# Patient Record
Sex: Male | Born: 1940 | Race: White | Hispanic: No | Marital: Married | Smoking: Former smoker
Health system: Southern US, Community
[De-identification: ages and names within clinical notes are randomized; demographics above are authoritative.]

## PROBLEM LIST (undated history)

## (undated) ENCOUNTER — Emergency Department (HOSPITAL_COMMUNITY): Admission: EM | Payer: Medicare HMO

## (undated) DIAGNOSIS — M19019 Primary osteoarthritis, unspecified shoulder: Secondary | ICD-10-CM

## (undated) DIAGNOSIS — Z951 Presence of aortocoronary bypass graft: Secondary | ICD-10-CM

## (undated) DIAGNOSIS — Z953 Presence of xenogenic heart valve: Secondary | ICD-10-CM

## (undated) DIAGNOSIS — E119 Type 2 diabetes mellitus without complications: Secondary | ICD-10-CM

## (undated) DIAGNOSIS — Z9861 Coronary angioplasty status: Secondary | ICD-10-CM

## (undated) DIAGNOSIS — I2510 Atherosclerotic heart disease of native coronary artery without angina pectoris: Secondary | ICD-10-CM

## (undated) DIAGNOSIS — I10 Essential (primary) hypertension: Secondary | ICD-10-CM

## (undated) DIAGNOSIS — C679 Malignant neoplasm of bladder, unspecified: Secondary | ICD-10-CM

## (undated) DIAGNOSIS — D472 Monoclonal gammopathy: Secondary | ICD-10-CM

## (undated) DIAGNOSIS — E782 Mixed hyperlipidemia: Secondary | ICD-10-CM

## (undated) DIAGNOSIS — R911 Solitary pulmonary nodule: Secondary | ICD-10-CM

## (undated) DIAGNOSIS — D509 Iron deficiency anemia, unspecified: Secondary | ICD-10-CM

## (undated) DIAGNOSIS — I350 Nonrheumatic aortic (valve) stenosis: Secondary | ICD-10-CM

## (undated) DIAGNOSIS — N189 Chronic kidney disease, unspecified: Secondary | ICD-10-CM

## (undated) DIAGNOSIS — K219 Gastro-esophageal reflux disease without esophagitis: Secondary | ICD-10-CM

## (undated) HISTORY — PX: TONSILLECTOMY: SUR1361

## (undated) HISTORY — DX: Malignant neoplasm of bladder, unspecified: C67.9

## (undated) HISTORY — DX: Atherosclerotic heart disease of native coronary artery without angina pectoris: I25.10

## (undated) HISTORY — PX: APPENDECTOMY: SHX54

## (undated) HISTORY — PX: BACK SURGERY: SHX140

## (undated) HISTORY — DX: Coronary angioplasty status: Z98.61

---

## 2001-01-02 ENCOUNTER — Encounter: Payer: Self-pay | Admitting: *Deleted

## 2001-01-02 ENCOUNTER — Inpatient Hospital Stay (HOSPITAL_COMMUNITY): Admission: EM | Admit: 2001-01-02 | Discharge: 2001-01-03 | Payer: Self-pay | Admitting: Emergency Medicine

## 2017-08-21 ENCOUNTER — Encounter (HOSPITAL_BASED_OUTPATIENT_CLINIC_OR_DEPARTMENT_OTHER): Payer: Self-pay | Admitting: *Deleted

## 2017-08-21 NOTE — Patient Instructions (Signed)
Erik Keith  08/21/2017   Your procedure is scheduled on: 08/27/2017   Report to [facility] Main  Entrance  Report to admitting at    1030 AM    Call this number if you have problems the morning of surgery [phone]   Remember: Do not eat food  :After Midnight. Erik Keith have clear liquids from 12 midnite until 0700am morning of surgery then nothing by mouth.      Take these medicines the morning of surgery with A SIP OF WATER: Hydrocodone if needed, Protonix  DO NOT TAKE ANY DIABETIC MEDICATIONS DAY OF YOUR SURGERY                               You Erik Keith not have any metal on your body including hair pins and              piercings  Do not wear jewelry, , lotions, powders or perfumes, deodorant                           Men Erik Keith shave face and neck.   Do not bring valuables to the hospital. Erik Keith.  Contacts, dentures or bridgework Erik Keith not be worn into surgery.  Leave suitcase in the car. After surgery it Erik Keith be brought to your room.                       Please read over the following fact sheets you were given: _____________________________________________________________________                CLEAR LIQUID DIET   Foods Allowed                                                                     Foods Excluded  Coffee and tea, regular and decaf                             liquids that you cannot  Plain Jell-O in any flavor                                             see through such as: Fruit ices (not with fruit pulp)                                     milk, soups, orange juice  Iced Popsicles                                    All solid food Carbonated beverages, regular and diet  Cranberry, grape and apple juices Sports drinks like Gatorade Lightly seasoned clear broth or consume(fat free) Sugar, honey syrup  Sample Menu Breakfast                                 Lunch                                     Supper Cranberry juice                    Beef broth                            Chicken broth Jell-O                                     Grape juice                           Apple juice Coffee or tea                        Jell-O                                      Popsicle                                                Coffee or tea                        Coffee or tea  _____________________________________________________________________  Erik Keith [facility] - Preparing for Surgery Before surgery, you can play an important role.  Because skin is not sterile, your skin needs to be as free of germs as possible.  You can reduce the number of germs on your skin by washing with CHG (chlorahexidine gluconate) soap before surgery.  CHG is an antiseptic cleaner which kills germs and bonds with the skin to continue killing germs even after washing. Please DO NOT use if you have an allergy to CHG or antibacterial soaps.  If your skin becomes reddened/irritated stop using the CHG and inform your nurse when you arrive at Short Stay. Do not shave (including legs and underarms) for at least 48 hours prior to the first CHG shower.  You Erik Keith shave your face/neck. Please follow these instructions carefully:  1.  Shower with CHG Soap the night before surgery and the  morning of Surgery.  2.  If you choose to wash your hair, wash your hair first as usual with your  normal  shampoo.  3.  After you shampoo, rinse your hair and body thoroughly to remove the  shampoo.                           4.  Use CHG as you would any other liquid soap.  You can apply chg directly  to the skin and wash  Gently with a scrungie or clean washcloth.  5.  Apply the CHG Soap to your body ONLY FROM THE NECK DOWN.   Do not use on face/ open                           Wound or open sores. Avoid contact with eyes, ears mouth and genitals (private parts).                        Wash face,  Genitals (private parts) with your normal soap.             6.  Wash thoroughly, paying special attention to the area where your surgery  will be performed.  7.  Thoroughly rinse your body with warm water from the neck down.  8.  DO NOT shower/wash with your normal soap after using and rinsing off  the CHG Soap.                9.  Pat yourself dry with a clean towel.            10.  Wear clean pajamas.            11.  Place clean sheets on your bed the night of your first shower and do not  sleep with pets. Day of Surgery : Do not apply any lotions/deodorants the morning of surgery.  Please wear clean clothes to the hospital/surgery center.  FAILURE TO FOLLOW THESE INSTRUCTIONS Erik Keith RESULT IN THE CANCELLATION OF YOUR SURGERY PATIENT SIGNATURE_________________________________  NURSE SIGNATURE__________________________________  ________________________________________________________________________   Erik Keith  An incentive spirometer is a tool that can help keep your lungs clear and active. This tool measures how well you are filling your lungs with each breath. Taking long deep breaths [place] help reverse or decrease the chance of developing breathing (pulmonary) problems (especially infection) following:  A long period of time when you are unable to move or be active. BEFORE THE PROCEDURE   If the spirometer includes an indicator to show your best effort, your nurse or respiratory therapist will set it to a desired goal.  If possible, sit up straight or lean slightly forward. Try not to slouch.  Hold the incentive spirometer in an upright position. INSTRUCTIONS FOR USE  1. Sit on the edge of your bed if possible, or sit up as far as you can in bed or on a chair. 2. Hold the incentive spirometer in an upright position. 3. Breathe out normally. 4. Place the mouthpiece in your mouth and seal your lips tightly around it. 5. Breathe in slowly and as deeply as  possible, raising the piston or the ball toward the top of the column. 6. Hold your breath for 3-5 seconds or for as long as possible. Allow the piston or ball to fall to the bottom of the column. 7. Remove the mouthpiece from your mouth and breathe out normally. 8. Rest for a few seconds and repeat Steps 1 through 7 at least 10 times every 1-2 hours when you are awake. Take your time and take a few normal breaths between deep breaths. 9. The spirometer [place] include an indicator to show your best effort. Use the indicator as a goal to work toward during each repetition. 10. After each set of 10 deep breaths, practice coughing to be sure your lungs are clear. If you have an incision (the cut made at the time of  surgery), support your incision when coughing by placing a pillow or rolled up towels firmly against it. Once you are able to get out of bed, walk around indoors and cough well. You [place] stop using the incentive spirometer when instructed by your caregiver.  RISKS AND COMPLICATIONS  Take your time so you do not get dizzy or light-headed.  If you are in pain, you [place] need to take or ask for pain medication before doing incentive spirometry. It is harder to take a deep breath if you are having pain. AFTER USE  Rest and breathe slowly and easily.  It can be helpful to keep track of a log of your progress. Your caregiver can provide you with a simple table to help with this. If you are using the spirometer at home, follow these instructions: [place] IF:   You are having difficultly using the spirometer.  You have trouble using the spirometer as often as instructed.  Your pain medication is not giving enough relief while using the spirometer.  You develop fever of 100.5 F (38.1 C) or higher. SEEK IMMEDIATE MEDICAL CARE IF:   You cough up bloody sputum that had not been present before.  You develop fever of 102 F (38.9 C) or greater.  You develop worsening pain at or near  the incision site. MAKE SURE YOU:   Understand these instructions.  Will watch your condition.  Will get help right away if you are not doing well or get worse. Document Released: 04/30/2006 Document Revised: 03/12/2011 Document Reviewed: 07/01/2006 ExitCare Patient Information 2014 ExitCare, [place].   ________________________________________________________________________ How to Manage Your Diabetes Before and After Surgery  Why is it important to control my blood sugar before and after surgery? . Improving blood sugar levels before and after surgery helps healing and can limit problems. . A way of improving blood sugar control is eating a healthy diet by: o  Eating less sugar and carbohydrates o  Increasing activity/exercise o  Talking with your doctor about reaching your blood sugar goals . High blood sugars (greater than 180 mg/dL) can raise your risk of infections and slow your recovery, so you will need to focus on controlling your diabetes during the weeks before surgery. . Make sure that the doctor who takes care of your diabetes knows about your planned surgery including the date and location.  How do I manage my blood sugar before surgery? . Check your blood sugar at least 4 times a day, starting 2 days before surgery, to make sure that the level is not too high or low. o Check your blood sugar the morning of your surgery when you wake up and every 2 hours until you get to the Short Stay unit. . If your blood sugar is less than 70 mg/dL, you will need to treat for low blood sugar: o Do not take insulin. o Treat a low blood sugar (less than 70 mg/dL) with  cup of clear juice (cranberry or apple), 4 glucose tablets, OR glucose gel. o Recheck blood sugar in 15 minutes after treatment (to make sure it is greater than 70 mg/dL). If your blood sugar is not greater than 70 mg/dL on recheck, call [phone] for further instructions. . Report your blood sugar to the short stay  nurse when you get to Short Stay.  . If you are admitted to the hospital after surgery: o Your blood sugar will be checked by the staff and you will probably be given insulin after surgery (  instead of oral diabetes medicines) to make sure you have good blood sugar levels. o The goal for blood sugar control after surgery is 80-180 mg/dL.   WHAT DO I DO ABOUT MY DIABETES MEDICATION?  Marland Kitchen Do not take oral diabetes medicines (pills) the morning of surgery.  .       .  .  .  .          Patient Signature:  Date:   Nurse Signature:  Date:   Reviewed and Endorsed by [facility] Patient Education Committee, August 2015

## 2017-08-21 NOTE — Progress Notes (Signed)
Received pt cardiologist clearance w/ lov note, echo, and cardiac cath via fax from dr Theda Sers office , pt scheduled for left shoulder arthroscopy 08-27-2017.  Reviewed all information and updated history in epic.  Pt not a candidate for ambulatory surgery due to pt has severe aortic valve stenosis w/ a valve area of 0.61cm^2, which is below the guidelines of 1.0cm^2.  Called and spoke w/ laurie, dr Theda Sers scheduler, informed her that pt would need to done at main OR and why.

## 2017-08-22 ENCOUNTER — Encounter (HOSPITAL_COMMUNITY): Payer: Self-pay

## 2017-08-22 ENCOUNTER — Encounter (HOSPITAL_COMMUNITY)
Admission: RE | Admit: 2017-08-22 | Discharge: 2017-08-22 | Disposition: A | Discharge status: Home / Self Care | Payer: Medicare HMO | Source: Ambulatory Visit | Attending: Specialist | Admitting: Specialist

## 2017-08-22 ENCOUNTER — Other Ambulatory Visit: Payer: Self-pay

## 2017-08-22 DIAGNOSIS — M19012 Primary osteoarthritis, left shoulder: Secondary | ICD-10-CM | POA: Insufficient documentation

## 2017-08-22 DIAGNOSIS — Z01812 Encounter for preprocedural laboratory examination: Secondary | ICD-10-CM | POA: Insufficient documentation

## 2017-08-22 HISTORY — DX: Gastro-esophageal reflux disease without esophagitis: K21.9

## 2017-08-22 LAB — CBC WITH DIFFERENTIAL/PLATELET
Basophils Absolute: 0 10*3/uL (ref 0.0–0.1)
Basophils Relative: 0 %
Eosinophils Absolute: 0.1 10*3/uL (ref 0.0–0.7)
Eosinophils Relative: 1 %
HCT: 46.3 % (ref 39.0–52.0)
Hemoglobin: 15.5 g/dL (ref 13.0–17.0)
Lymphocytes Relative: 38 %
Lymphs Abs: 3.6 10*3/uL (ref 0.7–4.0)
MCH: 31.8 pg (ref 26.0–34.0)
MCHC: 33.5 g/dL (ref 30.0–36.0)
MCV: 94.9 fL (ref 78.0–100.0)
Monocytes Absolute: 0.7 10*3/uL (ref 0.1–1.0)
Monocytes Relative: 8 %
Neutro Abs: 4.9 10*3/uL (ref 1.7–7.7)
Neutrophils Relative %: 53 %
Platelets: 168 10*3/uL (ref 150–400)
RBC: 4.88 MIL/uL (ref 4.22–5.81)
RDW: 14.2 % (ref 11.5–15.5)
WBC: 9.3 10*3/uL (ref 4.0–10.5)

## 2017-08-22 LAB — COMPREHENSIVE METABOLIC PANEL
ALT: 31 U/L (ref 0–44)
AST: 29 U/L (ref 15–41)
Albumin: 4.4 g/dL (ref 3.5–5.0)
Alkaline Phosphatase: 53 U/L (ref 38–126)
Anion gap: 8 (ref 5–15)
BUN: 23 mg/dL (ref 8–23)
CO2: 27 mmol/L (ref 22–32)
Calcium: 10.1 mg/dL (ref 8.9–10.3)
Chloride: 106 mmol/L (ref 98–111)
Creatinine, Ser: 1.3 mg/dL — ABNORMAL HIGH (ref 0.61–1.24)
GFR calc Af Amer: 59 mL/min — ABNORMAL LOW (ref >60)
GFR calc non Af Amer: 51 mL/min — ABNORMAL LOW (ref >60)
Glucose, Bld: 145 mg/dL — ABNORMAL HIGH (ref 70–99)
Potassium: 4.9 mmol/L (ref 3.5–5.1)
Sodium: 141 mmol/L (ref 135–145)
Total Bilirubin: 0.9 mg/dL (ref 0.3–1.2)
Total Protein: 7.9 g/dL (ref 6.5–8.1)

## 2017-08-22 LAB — HEMOGLOBIN A1C
Hgb A1c MFr Bld: 7.9 % — ABNORMAL HIGH (ref 4.8–5.6)
MEAN PLASMA GLUCOSE: 180.03 mg/dL

## 2017-08-22 LAB — APTT: aPTT: 31 seconds (ref 24–36)

## 2017-08-22 LAB — PROTIME-INR
INR: 0.88
Prothrombin Time: 11.9 seconds (ref 11.4–15.2)

## 2017-08-22 LAB — GLUCOSE, CAPILLARY: GLUCOSE-CAPILLARY: 154 mg/dL — AB (ref 70–99)

## 2017-08-22 NOTE — Progress Notes (Signed)
CMp done 08/22/2017 faxed via epic to dr Theda Sers.

## 2017-08-22 NOTE — Progress Notes (Signed)
Anesthesia ( Dr Milton Ferguson ) made aware that patient has severe Aortic Stenosis.  Anesthesia read report.  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  Cardiac clearance on chart by Dr Dr Beatrix Fetters dated 07/23/2017.  01/30/2017 ekgs on chart.  05/16/2917 LOV - Dr Beatrix Fetters on chart  Echo- 06/11/2017 on chart.   04/27/2015- Heart cath report on chart .   Patient plays golf ( 18 holes) 3 times per week and mows 3 yards per week.   No new orders given by anesthesia.

## 2017-08-23 NOTE — Progress Notes (Signed)
.......  [birth year]  patient's date of birth is  Your patient has screened at an elevated risk for obstructive sleep apnea using the STOP-Bang tool during a presurgical visit. A score of 5 or greater is an elevated risk. Patient Score = 5.

## 2017-08-27 ENCOUNTER — Ambulatory Visit (HOSPITAL_COMMUNITY): Payer: Medicare HMO | Admitting: Certified Registered Nurse Anesthetist

## 2017-08-27 ENCOUNTER — Encounter (HOSPITAL_COMMUNITY): Payer: Self-pay

## 2017-08-27 ENCOUNTER — Ambulatory Visit (HOSPITAL_COMMUNITY)
Admission: RE | Admit: 2017-08-27 | Discharge: 2017-08-27 | Disposition: A | Discharge status: Home / Self Care | Payer: Medicare HMO | Source: Ambulatory Visit | Attending: Specialist | Admitting: Specialist

## 2017-08-27 ENCOUNTER — Telehealth: Payer: Self-pay | Admitting: Cardiovascular Disease

## 2017-08-27 ENCOUNTER — Encounter (HOSPITAL_COMMUNITY)
Admission: RE | Disposition: A | Discharge status: Home / Self Care | Payer: Self-pay | Source: Ambulatory Visit | Attending: Specialist

## 2017-08-27 DIAGNOSIS — M19012 Primary osteoarthritis, left shoulder: Secondary | ICD-10-CM | POA: Insufficient documentation

## 2017-08-27 DIAGNOSIS — Z5309 Procedure and treatment not carried out because of other contraindication: Secondary | ICD-10-CM | POA: Insufficient documentation

## 2017-08-27 DIAGNOSIS — I350 Nonrheumatic aortic (valve) stenosis: Secondary | ICD-10-CM | POA: Insufficient documentation

## 2017-08-27 HISTORY — DX: Iron deficiency anemia, unspecified: D50.9

## 2017-08-27 HISTORY — DX: Primary osteoarthritis, unspecified shoulder: M19.019

## 2017-08-27 HISTORY — DX: Mixed hyperlipidemia: E78.2

## 2017-08-27 HISTORY — DX: Essential (primary) hypertension: I10

## 2017-08-27 HISTORY — DX: Type 2 diabetes mellitus without complications: E11.9

## 2017-08-27 HISTORY — DX: Nonrheumatic aortic (valve) stenosis: I35.0

## 2017-08-27 LAB — GLUCOSE, CAPILLARY: Glucose-Capillary: 199 mg/dL — ABNORMAL HIGH (ref 70–99)

## 2017-08-27 SURGERY — SHOULDER ARTHROSCOPY WITH ROTATOR CUFF REPAIR AND SUBACROMIAL DECOMPRESSION
Anesthesia: General | Laterality: Left

## 2017-08-27 MED ORDER — ROCURONIUM BROMIDE 100 MG/10ML IV SOLN
INTRAVENOUS | Status: AC
  Filled 2017-08-27: qty 1

## 2017-08-27 MED ORDER — LIDOCAINE 2% (20 MG/ML) 5 ML SYRINGE
INTRAMUSCULAR | Status: AC
  Filled 2017-08-27: qty 5

## 2017-08-27 MED ORDER — SODIUM CHLORIDE 0.9 % IV SOLN
INTRAVENOUS | Status: DC
  Administered 2017-08-27: 11:00:00 via INTRAVENOUS

## 2017-08-27 MED ORDER — CEFAZOLIN SODIUM-DEXTROSE 2-4 GM/100ML-% IV SOLN
2.0000 g | INTRAVENOUS | Status: DC
  Filled 2017-08-27: qty 100

## 2017-08-27 MED ORDER — MIDAZOLAM HCL 2 MG/2ML IJ SOLN
0.5000 mg | Freq: Once | INTRAMUSCULAR | Status: AC
  Administered 2017-08-27 (×2): 1 mg via INTRAVENOUS
  Filled 2017-08-27: qty 2

## 2017-08-27 MED ORDER — FENTANYL CITRATE (PF) 100 MCG/2ML IJ SOLN
100.0000 ug | Freq: Once | INTRAMUSCULAR | Status: AC
  Administered 2017-08-27: 50 ug via INTRAVENOUS
  Filled 2017-08-27: qty 2

## 2017-08-27 MED ORDER — CHLORHEXIDINE GLUCONATE 4 % EX LIQD
60.0000 mL | Freq: Once | CUTANEOUS | Status: AC
  Administered 2017-08-27: 4 via TOPICAL

## 2017-08-27 MED ORDER — BUPIVACAINE-EPINEPHRINE (PF) 0.25% -1:200000 IJ SOLN
INTRAMUSCULAR | Status: AC
  Filled 2017-08-27: qty 30

## 2017-08-27 MED ORDER — PROPOFOL 10 MG/ML IV BOLUS
INTRAVENOUS | Status: AC
  Filled 2017-08-27: qty 20

## 2017-08-27 MED ORDER — BUPIVACAINE-EPINEPHRINE (PF) 0.5% -1:200000 IJ SOLN
INTRAMUSCULAR | Status: AC
  Filled 2017-08-27: qty 30

## 2017-08-27 NOTE — Telephone Encounter (Signed)
I spoke with Dr Kalman Shan and the pt was scheduled for elective shoulder surgery today.  When Dr Kalman Shan reviewed the pt's chart he noted aortic valve measurements and made pt aware.  The pt was unaware of these findings and surgery was cancelled.  I advised Dr Kalman Shan that I would reach out to the pt to educate him about AS and AVR/TAVR evaluation. The pt is currently followed by Dr Mathis Bud for cardiology per notes in [place].   I left a message on the pt's answering machine to contact me to discuss Aortic Stenosis.

## 2017-08-27 NOTE — Progress Notes (Signed)
Surgery cancelled until patient is evaluated by cardiologist for valvular problems. Wife and patient made aware by Dr Theda Sers and Dr Kalman Shan.

## 2017-08-27 NOTE — Anesthesia Preprocedure Evaluation (Deleted)
Anesthesia Evaluation  Patient identified by MRN, date of birth, ID band Patient awake    Reviewed: Allergy & Precautions, NPO status , Patient's Chart, lab work & pertinent test results  Airway Mallampati: II  TM Distance: >3 FB Neck ROM: Full    Dental no notable dental hx.    Pulmonary neg pulmonary ROS, former smoker,    Pulmonary exam normal breath sounds clear to auscultation       Cardiovascular hypertension, + CAD  + Valvular Problems/Murmurs AS  Rhythm:Regular Rate:Normal + Systolic murmurs    Neuro/Psych negative neurological ROS  negative psych ROS   GI/Hepatic negative GI ROS, Neg liver ROS,   Endo/Other  diabetes  Renal/GU negative Renal ROS  negative genitourinary   Musculoskeletal negative musculoskeletal ROS (+)   Abdominal   Peds negative pediatric ROS (+)  Hematology negative hematology ROS (+)   Anesthesia Other Findings   Reproductive/Obstetrics negative OB ROS                            Anesthesia Physical Anesthesia Plan  ASA: IV  Anesthesia Plan: General   Post-op Pain Management:    Induction: Intravenous  PONV Risk Score and Plan: 2 and Ondansetron, Dexamethasone and Treatment [place] vary due to age or medical condition  Airway Management Planned: Oral ETT  Additional Equipment: Arterial line  Intra-op Plan:   Post-operative Plan: Extubation in OR  Informed Consent: I have reviewed the patients History and Physical, chart, labs and discussed the procedure including the risks, benefits and alternatives for the proposed anesthesia with the patient or authorized representative who has indicated his/her understanding and acceptance.   Dental advisory given  Plan Discussed with: CRNA and Surgeon  Anesthesia Plan Comments:        Anesthesia Quick Evaluation

## 2017-08-27 NOTE — Telephone Encounter (Signed)
I spoke with the pt and made him aware of Aortic Stenosis on Echocardiogram (06/11/17 Care Everywhere) and evaluation for AVR/TAVR.  The pt has been managed by a cardiologist affiliated with [facility] but the pt does not want to follow-up with this physician and would like to seek evaluation at [facility].  I scheduled the pt for Structural Heart evaluation with Dr Angelena Form on 09/05/17 and mailed him an apt card.   Upon further review of Care Everywhere this pt was also followed by Dr Bettina Gavia in the past with his last note being 04/19/16.

## 2017-08-27 NOTE — Telephone Encounter (Signed)
° ° °  Dr Myrtie Soman calling to discuss possible TAVR Please call [phone]

## 2017-09-04 ENCOUNTER — Encounter: Payer: Self-pay | Admitting: Cardiovascular Disease

## 2017-09-05 ENCOUNTER — Ambulatory Visit: Payer: Medicare HMO | Admitting: Cardiovascular Disease

## 2017-09-05 ENCOUNTER — Ambulatory Visit (HOSPITAL_COMMUNITY): Payer: Medicare HMO | Attending: Cardiovascular Disease

## 2017-09-05 ENCOUNTER — Other Ambulatory Visit: Payer: Self-pay

## 2017-09-05 ENCOUNTER — Encounter: Payer: Self-pay | Admitting: Cardiovascular Disease

## 2017-09-05 VITALS — BP 136/80 | HR 75 | Ht 71.0 in | Wt 203.4 lb

## 2017-09-05 DIAGNOSIS — D649 Anemia, unspecified: Secondary | ICD-10-CM | POA: Diagnosis not present

## 2017-09-05 DIAGNOSIS — I350 Nonrheumatic aortic (valve) stenosis: Secondary | ICD-10-CM

## 2017-09-05 DIAGNOSIS — I119 Hypertensive heart disease without heart failure: Secondary | ICD-10-CM | POA: Insufficient documentation

## 2017-09-05 DIAGNOSIS — E785 Hyperlipidemia, unspecified: Secondary | ICD-10-CM | POA: Insufficient documentation

## 2017-09-05 DIAGNOSIS — E119 Type 2 diabetes mellitus without complications: Secondary | ICD-10-CM | POA: Insufficient documentation

## 2017-09-05 DIAGNOSIS — I2510 Atherosclerotic heart disease of native coronary artery without angina pectoris: Secondary | ICD-10-CM | POA: Diagnosis not present

## 2017-09-05 DIAGNOSIS — I080 Rheumatic disorders of both mitral and aortic valves: Secondary | ICD-10-CM | POA: Diagnosis not present

## 2017-09-05 LAB — ECHOCARDIOGRAM COMPLETE
Height: 71 in
Weight: 3254.4 oz

## 2017-09-05 MED ORDER — PREDNISONE 50 MG PO TABS: ORAL_TABLET | ORAL | 0 refills | Status: DC

## 2017-09-05 NOTE — Progress Notes (Signed)
Valve Clinic Consult Note  Chief Complaint  Patient presents with  . New Patient (Initial Visit)    severe aortic stenosis   History of Present Illness:77 yo male with history of severe aortic stenosis, CAD, GERD, HTN, hyperlipidemia, iron deficiency anemia, arthritis, diabetes who is here today as a new patient for further discussion regarding his aortic stenosis and possible TAVR. He had been followed in the past by Dr. Bettina Gavia in [place] but most recently had been seen by Dr.McGukin in [facility] when Dr. Bettina Gavia left that practice. Echo June 2019 at [facility] with normal LV systolic function, [id number]%, mild LVH, severe aortic stenosis with mean gradient 45 mmHg and AVA 0.61 cm2. There was mild aortic valve insufficiency. He was recently at [facility] to have shoulder surgery and given his severe aortic stenosis his shoulder surgery was cancelled. He also has CAD and has had a stent placed in the LAD in 2008 and a stent placed in the RCA in 2009. Last cardiac cath April 2017 with patent stents and mild non-obstructive CAD otherwise. He was found to have anemia 6 months ago with heme positive stool. Colonoscopy was ok. He has been on iron and hemoglobin improved.  He tells me today that he has been having chest pressure with exertion. He has dyspnea with exertion. He has worsened fatigue. He has no lower extremity edema. He has full dentures. He lives with his wife in [place]. He is here alone today. He is retired from [employer]. He plays golf weekly.   Primary Care Physician: Raina Mina., MD Primary Cardiologist: Dr. Beatrix Fetters (Formerly [facility]) Would like to follow with [facility] now Referring Physician:Dr. Kalman Shan (anesthesiology)  Past Medical History:  Diagnosis Date  . Aortic valve stenosis, severe    PER LAST ECHO FROM CARDIOLOGIST OFFICE DATED 06-11-2017  SEVERE FIBROCALCIFIC SCLERSOSIS W/ SEVERE STENOSIS , MEAD GRADIENT 28mmHg, VALVE AREA 0.61 cm^2  . CAD S/P  percutaneous coronary angioplasty    12-2006  STENT TO LAD;  07-2007 DEStenting TO RCA   . GERD (gastroesophageal reflux disease)   . Hypertension   . Iron deficiency anemia   . Mixed hyperlipidemia   . OA (osteoarthritis) of shoulder    LEFT SHOULDER AND AC JOINT  . Type 2 diabetes mellitus ([place])     Past Surgical History:  Procedure Laterality Date  . APPENDECTOMY    . BACK SURGERY  x3  . TONSILLECTOMY      Current Outpatient Medications  Medication Sig Dispense Refill  . aspirin EC 81 MG tablet Take 81 mg by mouth daily.    Marland Kitchen b complex vitamins tablet Take 1 tablet by mouth daily.    . ferrous sulfate 324 (65 Fe) MG TBEC Take 1 tablet by mouth daily.     Marland Kitchen gabapentin (NEURONTIN) 300 MG capsule Take 600 mg by mouth at bedtime.     Marland Kitchen glipiZIDE (GLUCOTROL XL) 5 MG 24 hr tablet Take 5 mg by mouth 2 (two) times daily.    . Glucos-Chond-Hyal Ac-Ca Fructo (MOVE FREE JOINT HEALTH ADVANCE PO) Take 1 tablet by mouth daily as needed (joint pain).    Marland Kitchen HYDROcodone-acetaminophen (NORCO) 10-325 MG tablet Take 1 tablet by mouth every 6 (six) hours as needed for moderate pain.     . metFORMIN (GLUCOPHAGE) 500 MG tablet Take 500 mg by mouth 2 (two) times daily with a meal.     . nitroGLYCERIN (NITROSTAT) 0.4 MG SL tablet Place 0.4 mg under  the tongue every 5 (five) minutes as needed for chest pain.   1  . pantoprazole (PROTONIX) 40 MG tablet Take 40 mg by mouth daily.    . Polyethylene Glycol 3350 (MIRALAX PO) Take 17 g by mouth daily.     . ramipril (ALTACE) 5 MG capsule Take 5 mg by mouth 2 (two) times daily.    . simvastatin (ZOCOR) 40 MG tablet Take 40 mg by mouth daily.    Marland Kitchen zolpidem (AMBIEN) 10 MG tablet Take 10 mg by mouth at bedtime as needed for sleep.    . predniSONE (DELTASONE) 50 MG tablet Take one tablet 13 and 7 hours prior to procedure and one tablet just before leaving for hospital for procedure. 3 tablet 0   No current facility-administered medications for this visit.      Allergies  Allergen Reactions  . Contrast Media [Iodinated Diagnostic Agents] Hives  . Penicillins Hives    Has patient had a PCN reaction causing immediate rash, facial/tongue/throat swelling, SOB or lightheadedness with hypotension: unkn Has patient had a PCN reaction causing severe rash involving mucus membranes or skin necrosis: unkn Has patient had a PCN reaction that required hospitalization: unkn Has patient had a PCN reaction occurring within the last 10 years: no If all of the above answers are "NO", then [place] proceed with Cephalosporin use.     Social History   Socioeconomic History  . Marital status: Married    Spouse name: Lenell Antu  . Number of children: 2  . Years of education: Not on file  . Highest education level: Not on file  Occupational History  . Occupation: Retired-worked at [employer]  . Financial resource strain: Not on file  . Food insecurity:    Worry: Not on file    Inability: Not on file  . Transportation needs:    Medical: Not on file    Non-medical: Not on file  Tobacco Use  . Smoking status: Former Smoker    Years: 40.00    Last attempt to quit: 2002    Years since quitting: 17.6  . Smokeless tobacco: Never Used  Substance and Sexual Activity  . Alcohol use: Never    Frequency: Never  . Drug use: Never  . Sexual activity: Not on file  Lifestyle  . Physical activity:    Days per week: Not on file    Minutes per session: Not on file  . Stress: Not on file  Relationships  . Social connections:    Talks on phone: Not on file    Gets together: Not on file    Attends religious service: Not on file    Active member of club or organization: Not on file    Attends meetings of clubs or organizations: Not on file    Relationship status: Not on file  . Intimate partner violence:    Fear of current or ex partner: Not on file    Emotionally abused: Not on file    Physically abused: Not on file    Forced sexual activity:  Not on file  Other Topics Concern  . Not on file  Social History Narrative  . Not on file    Family History  Problem Relation Age of Onset  . CVA Father   . Heart attack Sister   . Heart attack Brother        3 brothers with CAD    Review of Systems:  As stated in the HPI and otherwise  negative.   BP 136/80   Pulse 75   Ht 5\' 11"  (1.803 m)   Wt 203 lb 6.4 oz (92.3 kg)   BMI 28.37 kg/m   Physical Examination: General: Well developed, well nourished, NAD  HEENT: OP clear, mucus membranes moist  SKIN: warm, dry. No rashes. Neuro: No focal deficits  Musculoskeletal: Muscle strength 5/5 all ext  Psychiatric: Mood and affect normal  Neck: No JVD, no carotid bruits, no thyromegaly, no lymphadenopathy.  Lungs:Clear bilaterally, no wheezes, rhonci, crackles Cardiovascular: Regular rate and rhythm. Loud, harsh, late peaking systolic murmur.  Abdomen:Soft. Bowel sounds present. Non-tender.  Extremities: No lower extremity edema. Pulses are trace in the bilateral DP/PT. Skin changes c/w chronic arterial disease in both lower extremities  EKG:  EKG is ordered today. The ekg ordered today demonstrates Sinus rhythm. Rate 75 bpm. PACs. Non-specific T wave abn.   Recent Labs: 08/22/2017: ALT 31; BUN 23; Creatinine, Ser 1.30; Hemoglobin 15.5; Platelets 168; Potassium 4.9; Sodium 141   Lipid Panel No results found for: CHOL, TRIG, HDL, CHOLHDL, VLDL, LDLCALC, LDLDIRECT   Wt Readings from Last 3 Encounters:  09/05/17 203 lb 6.4 oz (92.3 kg)  08/27/17 201 lb (91.2 kg)  08/22/17 201 lb (91.2 kg)     Other studies Reviewed: Additional studies/ records that were reviewed today include: .office records, EKG, echo report Review of the above records demonstrates:    Assessment and Plan:   1. Severe aortic valve stenosis: Based on the data we have available, he has severe aortic stenosis. I cannot review the echo images from June 2019. This was done at [facility] in [place].  We will repeat an echo now in our system. I think he would benefit from AVR. Given advanced age, I think we should consider TAVR.   STS Risk Score:  Procedure: Isolated AVR  Risk of Mortality: 2.080%  Renal Failure: 2.714%  Permanent Stroke: 0.909%  Prolonged Ventilation: 8.152%  DSW Infection: 0.134%  Reoperation:4.789%  Morbidity or Mortality: 12.940%  Short Length of Stay: 35.276%  Long Length of Stay: 5.646%  .  I have reviewed the natural history of aortic stenosis with the patient and their family members  who are present today. We have discussed the limitations of medical therapy and the poor prognosis associated with symptomatic aortic stenosis. We have reviewed potential treatment options, including palliative medical therapy, conventional surgical aortic valve replacement, and transcatheter aortic valve replacement. We discussed treatment options in the context of the patient's specific comorbid medical conditions.   He would like to proceed with planning for TAVR. We will arrange an echocardiogram to confirm the aortic stenosis. I will arrange a right and left heart catheterization at [facility] 09/12/17. Risks and benefits of the cath procedure and the TAVR procedure reviewed with the patient. After the cath, Ardeen Fillers will have a cardiac CT, CTA of the chest/abdomen and pelvis, PFTs, carotid dopplers and PT assessment and will then be referred to see one of the CT surgeons on our TAVR team.    Current medicines are reviewed at length with the patient today.  The patient does not have concerns regarding medicines.  The following changes have been made:  no change  Labs/ tests ordered today include:   Orders Placed This Encounter  Procedures  . CBC  . Basic Metabolic Panel (BMET)  . EKG 12-Lead  . ECHOCARDIOGRAM COMPLETE     Disposition:   FU with the valve team    Signed, Lauree Chandler, MD  09/05/2017 1:33 PM    [facility] [place],  [place], [place]  [address] Phone: [phone]; Fax: [phone]

## 2017-09-05 NOTE — H&P (View-Only) (Signed)
Valve Clinic Consult Note  Chief Complaint  Patient presents with  . New Patient (Initial Visit)    severe aortic stenosis   History of Present Illness:Erik Keith with history of severe aortic stenosis, CAD, GERD, HTN, hyperlipidemia, iron deficiency anemia, arthritis, diabetes who is here today as a new patient for further discussion regarding his aortic stenosis and possible TAVR. He had been followed in the past by Dr. Bettina Gavia in [place] but most recently had been seen by Dr.McGukin in [facility] when Dr. Bettina Gavia left that practice. Echo June 2019 at [facility] with normal LV systolic function, WNUU=[id number]%, mild LVH, severe aortic stenosis with mean gradient 45 mmHg and AVA 0.61 cm2. There was mild aortic valve insufficiency. He was recently at [facility] to have shoulder surgery and given his severe aortic stenosis his shoulder surgery was cancelled. He also has CAD and has had a stent placed in the LAD in 2008 and a stent placed in the RCA in 2009. Last cardiac cath April 2017 with patent stents and mild non-obstructive CAD otherwise. He was found to have anemia 6 months ago with heme positive stool. Colonoscopy was ok. He has been on iron and hemoglobin improved.  He tells me today that he has been having chest pressure with exertion. He has dyspnea with exertion. He has worsened fatigue. He has no lower extremity edema. He has full dentures. He lives with his wife in [place]. He is here alone today. He is retired from [employer]. He plays golf weekly.   Primary Care Physician: Raina Mina., MD Primary Cardiologist: Dr. Beatrix Fetters ([facility]) Would like to follow with [facility] now Referring Physician:Dr. Kalman Shan (anesthesiology)  Past Medical History:  Diagnosis Date  . Aortic valve stenosis, severe    PER LAST ECHO FROM CARDIOLOGIST OFFICE DATED 06-11-2017  SEVERE FIBROCALCIFIC SCLERSOSIS W/ SEVERE STENOSIS , MEAD GRADIENT 38mmHg, VALVE AREA 0.61 cm^2  . CAD S/P  percutaneous coronary angioplasty    12-2006  STENT TO LAD;  07-2007 DEStenting TO RCA   . GERD (gastroesophageal reflux disease)   . Hypertension   . Iron deficiency anemia   . Mixed hyperlipidemia   . OA (osteoarthritis) of shoulder    LEFT SHOULDER AND AC JOINT  . Type 2 diabetes mellitus ([place])     Past Surgical History:  Procedure Laterality Date  . APPENDECTOMY    . BACK SURGERY  x3  . TONSILLECTOMY      Current Outpatient Medications  Medication Sig Dispense Refill  . aspirin EC 81 MG tablet Take 81 mg by mouth daily.    Marland Kitchen b complex vitamins tablet Take 1 tablet by mouth daily.    . ferrous sulfate 324 (65 Fe) MG TBEC Take 1 tablet by mouth daily.     Marland Kitchen gabapentin (NEURONTIN) 300 MG capsule Take 600 mg by mouth at bedtime.     Marland Kitchen glipiZIDE (GLUCOTROL XL) 5 MG 24 hr tablet Take 5 mg by mouth 2 (two) times daily.    . Glucos-Chond-Hyal Ac-Ca Fructo (MOVE FREE JOINT HEALTH ADVANCE PO) Take 1 tablet by mouth daily as needed (joint pain).    Marland Kitchen HYDROcodone-acetaminophen (NORCO) 10-325 MG tablet Take 1 tablet by mouth every 6 (six) hours as needed for moderate pain.     . metFORMIN (GLUCOPHAGE) 500 MG tablet Take 500 mg by mouth 2 (two) times daily with a meal.     . nitroGLYCERIN (NITROSTAT) 0.4 MG SL tablet Place 0.4 mg under  the tongue every 5 (five) minutes as needed for chest pain.   1  . pantoprazole (PROTONIX) 40 MG tablet Take 40 mg by mouth daily.    . Polyethylene Glycol 3350 (MIRALAX PO) Take 17 g by mouth daily.     . ramipril (ALTACE) 5 MG capsule Take 5 mg by mouth 2 (two) times daily.    . simvastatin (ZOCOR) 40 MG tablet Take 40 mg by mouth daily.    Marland Kitchen zolpidem (AMBIEN) 10 MG tablet Take 10 mg by mouth at bedtime as needed for sleep.    . predniSONE (DELTASONE) 50 MG tablet Take one tablet 13 and 7 hours prior to procedure and one tablet just before leaving for hospital for procedure. 3 tablet 0   No current facility-administered medications for this visit.      Allergies  Allergen Reactions  . Contrast Media [Iodinated Diagnostic Agents] Hives  . Penicillins Hives    Has patient had a PCN reaction causing immediate rash, facial/tongue/throat swelling, SOB or lightheadedness with hypotension: unkn Has patient had a PCN reaction causing severe rash involving mucus membranes or skin necrosis: unkn Has patient had a PCN reaction that required hospitalization: unkn Has patient had a PCN reaction occurring within the last 10 years: no If all of the above answers are "NO", then [place] proceed with Cephalosporin use.     Social History   Socioeconomic History  . Marital status: Married    Spouse name: Lenell Antu  . Number of children: 2  . Years of education: Not on file  . Highest education level: Not on file  Occupational History  . Occupation: Retired-worked at [employer]  . Financial resource strain: Not on file  . Food insecurity:    Worry: Not on file    Inability: Not on file  . Transportation needs:    Medical: Not on file    Non-medical: Not on file  Tobacco Use  . Smoking status: Former Smoker    Years: 40.00    Last attempt to quit: 2002    Years since quitting: 17.6  . Smokeless tobacco: Never Used  Substance and Sexual Activity  . Alcohol use: Never    Frequency: Never  . Drug use: Never  . Sexual activity: Not on file  Lifestyle  . Physical activity:    Days per week: Not on file    Minutes per session: Not on file  . Stress: Not on file  Relationships  . Social connections:    Talks on phone: Not on file    Gets together: Not on file    Attends religious service: Not on file    Active member of club or organization: Not on file    Attends meetings of clubs or organizations: Not on file    Relationship status: Not on file  . Intimate partner violence:    Fear of current or ex partner: Not on file    Emotionally abused: Not on file    Physically abused: Not on file    Forced sexual activity:  Not on file  Other Topics Concern  . Not on file  Social History Narrative  . Not on file    Family History  Problem Relation Age of Onset  . CVA Father   . Heart attack Sister   . Heart attack Brother        3 brothers with CAD    Review of Systems:  As stated in the HPI and otherwise  negative.   BP 136/80   Pulse 75   Ht 5\' 11"  (1.803 m)   Wt 203 lb 6.4 oz (92.3 kg)   BMI 28.37 kg/m   Physical Examination: General: Well developed, well nourished, NAD  HEENT: OP clear, mucus membranes moist  SKIN: warm, dry. No rashes. Neuro: No focal deficits  Musculoskeletal: Muscle strength 5/5 all ext  Psychiatric: Mood and affect normal  Neck: No JVD, no carotid bruits, no thyromegaly, no lymphadenopathy.  Lungs:Clear bilaterally, no wheezes, rhonci, crackles Cardiovascular: Regular rate and rhythm. Loud, harsh, late peaking systolic murmur.  Abdomen:Soft. Bowel sounds present. Non-tender.  Extremities: No lower extremity edema. Pulses are trace in the bilateral DP/PT. Skin changes c/w chronic arterial disease in both lower extremities  EKG:  EKG is ordered today. The ekg ordered today demonstrates Sinus rhythm. Rate 75 bpm. PACs. Non-specific T wave abn.   Recent Labs: 08/22/2017: ALT 31; BUN 23; Creatinine, Ser 1.30; Hemoglobin 15.5; Platelets 168; Potassium 4.9; Sodium 141   Lipid Panel No results found for: CHOL, TRIG, HDL, CHOLHDL, VLDL, LDLCALC, LDLDIRECT   Wt Readings from Last 3 Encounters:  09/05/17 203 lb 6.4 oz (92.3 kg)  08/27/17 201 lb (91.2 kg)  08/22/17 201 lb (91.2 kg)     Other studies Reviewed: Additional studies/ records that were reviewed today include: .office records, EKG, echo report Review of the above records demonstrates:    Assessment and Plan:   1. Severe aortic valve stenosis: Based on the data we have available, he has severe aortic stenosis. I cannot review the echo images from June 2019. This was done at [facility] in [place].  We will repeat an echo now in our system. I think he would benefit from AVR. Given advanced age, I think we should consider TAVR.   STS Risk Score:  Procedure: Isolated AVR  Risk of Mortality: 2.080%  Renal Failure: 2.714%  Permanent Stroke: 0.909%  Prolonged Ventilation: 8.152%  DSW Infection: 0.134%  Reoperation:4.789%  Morbidity or Mortality: 12.940%  Short Length of Stay: 35.276%  Long Length of Stay: 5.646%  .  I have reviewed the natural history of aortic stenosis with the patient and their family members  who are present today. We have discussed the limitations of medical therapy and the poor prognosis associated with symptomatic aortic stenosis. We have reviewed potential treatment options, including palliative medical therapy, conventional surgical aortic valve replacement, and transcatheter aortic valve replacement. We discussed treatment options in the context of the patient's specific comorbid medical conditions.   He would like to proceed with planning for TAVR. We will arrange an echocardiogram to confirm the aortic stenosis. I will arrange a right and left heart catheterization at [facility] 09/12/17. Risks and benefits of the cath procedure and the TAVR procedure reviewed with the patient. After the cath, Ardeen Fillers will have a cardiac CT, CTA of the chest/abdomen and pelvis, PFTs, carotid dopplers and PT assessment and will then be referred to see one of the CT surgeons on our TAVR team.    Current medicines are reviewed at length with the patient today.  The patient does not have concerns regarding medicines.  The following changes have been made:  no change  Labs/ tests ordered today include:   Orders Placed This Encounter  Procedures  . CBC  . Basic Metabolic Panel (BMET)  . EKG 12-Lead  . ECHOCARDIOGRAM COMPLETE     Disposition:   FU with the valve team    Signed, Lauree Chandler, MD  09/05/2017 1:33 PM    [facility] [place],  [place], [place]  [address] Phone: [phone]; Fax: [phone]

## 2017-09-05 NOTE — Patient Instructions (Addendum)
Medication Instructions:  Your physician recommends that you continue on your current medications as directed. Please refer to the Current Medication list given to you today.   Labwork: Lab work to be done today--BMP and CBC  Testing/Procedures: Your physician has requested that you have a cardiac catheterization. Cardiac catheterization is used to diagnose and/or treat various heart conditions. Doctors Redd recommend this procedure for a number of different reasons. The most common reason is to evaluate chest pain. Chest pain can be a symptom of coronary artery disease (CAD), and cardiac catheterization can show whether plaque is narrowing or blocking your heart's arteries. This procedure is also used to evaluate the valves, as well as measure the blood flow and oxygen levels in different parts of your heart. For further information please visit [facility]uge[facility]iest[facility]tn. Please follow instruction sheet, as given.  Scheduled for 09/12/17  Your physician has requested that you have an echocardiogram. Echocardiography is a painless test that uses sound waves to create images of your heart. It provides your doctor with information about the size and shape of your heart and how well your heart's chambers and valves are working. This procedure takes approximately one hour. There are no restrictions for this procedure.   Follow-Up: To be arranged after catheterization.  Any Other Special Instructions Will Be Listed Below (If Applicable).     [place] OFFICE [place], [place] [address] Dept: [phone] Loc: [phone]  Nichalas Coin Dunker  09/05/2017  You are scheduled for a Cardiac Catheterization on Thursday, September 12 with Dr. Lauree Chandler.  1. Please arrive at the [facility] (Main Entrance A) at [facility] [address] [place], [place] [address] at 5:30 AM (This time is  two hours before your procedure to ensure your preparation). Free valet parking service is available.   Special note: Every effort is made to have your procedure done on time. Please understand that emergencies sometimes delay scheduled procedures.  2. Diet: Do not eat solid foods after midnight.  The patient Tab[place]a have clear liquids until 5am upon the day of the procedure.  3. Labs: Lab work was done in office on 09/05/17  4. Medication instructions in preparation for your procedure:   Contrast Allergy: Yes, Please take Prednisone 50mg  by mouth at: Thirteen hours prior to cath 6:30 PM on Wednesday Seven hours prior to cath 12:30 AM on Thursday And prior to leaving home please take last dose of Prednisone 50mg  and Benadryl 50mg  by mouth.    Do not take metformin the morning of the procedure and for 48 hours after the procedure. Do not take glipizide the morning of the procedure. Do not take ramipril the day before the procedure and the morning of the procedure.    On the morning of your procedure, take your Aspirin and any morning medicines NOT listed above.  You [place] use sips of water.  5. Plan for one night stay--bring personal belongings. 6. Bring a current list of your medications and current insurance cards. 7. You MUST have a responsible person to drive you home. 8. Someone MUST be with you the first 24 hours after you arrive home or your discharge will be delayed. 9. Please wear clothes that are easy to get on and off and wear slip-on shoes.  Thank you for allowing [place] to care for you!   -- [facility]    If you need a refill  on your cardiac medications before your next appointment, please call your pharmacy.

## 2017-09-06 ENCOUNTER — Telehealth: Payer: Self-pay | Admitting: Cardiovascular Disease

## 2017-09-06 LAB — CBC
HEMATOCRIT: 44.8 % (ref 37.5–51.0)
Hemoglobin: 15.5 g/dL (ref 13.0–17.7)
MCH: 33 pg (ref 26.6–33.0)
MCHC: 34.6 g/dL (ref 31.5–35.7)
MCV: 95 fL (ref 79–97)
PLATELETS: 184 10*3/uL (ref 150–450)
RBC: 4.7 x10E6/uL (ref 4.14–5.80)
RDW: 15.6 % — AB (ref 12.3–15.4)
WBC: 11 10*3/uL — AB (ref 3.4–10.8)

## 2017-09-06 LAB — BASIC METABOLIC PANEL
BUN/Creatinine Ratio: 15 (ref 10–24)
BUN: 17 mg/dL (ref 8–27)
CALCIUM: 9.9 mg/dL (ref 8.6–10.2)
CO2: 24 mmol/L (ref 20–29)
Chloride: 101 mmol/L (ref 96–106)
Creatinine, Ser: 1.13 mg/dL (ref 0.76–1.27)
GFR calc Af Amer: 72 mL/min/{1.73_m2} (ref >59)
GFR, EST NON AFRICAN AMERICAN: 62 mL/min/{1.73_m2} (ref >59)
Glucose: 139 mg/dL — ABNORMAL HIGH (ref 65–99)
POTASSIUM: 4.6 mmol/L (ref 3.5–5.2)
Sodium: 142 mmol/L (ref 134–144)

## 2017-09-06 NOTE — Telephone Encounter (Signed)
Follow Up:    Returning your call, concerning his Echo results. 

## 2017-09-06 NOTE — Telephone Encounter (Signed)
I spoke with pt and reviewed echo and lab results with him.

## 2017-09-09 ENCOUNTER — Other Ambulatory Visit: Payer: Self-pay

## 2017-09-09 DIAGNOSIS — I350 Nonrheumatic aortic (valve) stenosis: Secondary | ICD-10-CM

## 2017-09-09 NOTE — Progress Notes (Signed)
Orders

## 2017-09-10 ENCOUNTER — Telehealth: Payer: Self-pay | Admitting: *Deleted

## 2017-09-10 NOTE — Telephone Encounter (Addendum)
Catheterization scheduled at [facility] for: Thursday September 12, 2017 7:30 AM Verify arrival time and place: [place] Entrance A at: 5:30 AM  No solid food after midnight prior to cath, clear liquids until 5 AM day of procedure. Verify allergies in Epic--contrast allergy-13 hour prednisone and benadryl prep-discussed instructions with patient.  Hold: Glipizide -AM of procedure. Metformin-day of  of procedure and 48 hours post procedure.   Except hold medications AM meds can be  taken pre-cath with sip of water including: ASA 81 mg Benadryl 50 mg Prednisone 50 mg   Confirm patient has responsible person to drive home post procedure and for 24 hours after you arrive home: yes  LMTCB to discuss instructions with patient.  Discussed instructions with patient, he verbalized understanding, thanked me for call.

## 2017-09-12 ENCOUNTER — Ambulatory Visit (HOSPITAL_COMMUNITY)
Admission: RE | Admit: 2017-09-12 | Discharge: 2017-09-12 | Disposition: A | Discharge status: Home / Self Care | Payer: Medicare HMO | Source: Ambulatory Visit | Attending: Cardiovascular Disease | Admitting: Cardiovascular Disease

## 2017-09-12 ENCOUNTER — Encounter (HOSPITAL_COMMUNITY)
Admission: RE | Disposition: A | Discharge status: Home / Self Care | Payer: Self-pay | Source: Ambulatory Visit | Attending: Cardiovascular Disease

## 2017-09-12 ENCOUNTER — Other Ambulatory Visit: Payer: Self-pay

## 2017-09-12 DIAGNOSIS — I10 Essential (primary) hypertension: Secondary | ICD-10-CM | POA: Diagnosis not present

## 2017-09-12 DIAGNOSIS — Z91041 Radiographic dye allergy status: Secondary | ICD-10-CM | POA: Diagnosis not present

## 2017-09-12 DIAGNOSIS — Z79899 Other long term (current) drug therapy: Secondary | ICD-10-CM | POA: Diagnosis not present

## 2017-09-12 DIAGNOSIS — Z7984 Long term (current) use of oral hypoglycemic drugs: Secondary | ICD-10-CM | POA: Diagnosis not present

## 2017-09-12 DIAGNOSIS — E782 Mixed hyperlipidemia: Secondary | ICD-10-CM | POA: Diagnosis not present

## 2017-09-12 DIAGNOSIS — Z880 Allergy status to penicillin: Secondary | ICD-10-CM | POA: Diagnosis not present

## 2017-09-12 DIAGNOSIS — I25110 Atherosclerotic heart disease of native coronary artery with unstable angina pectoris: Secondary | ICD-10-CM | POA: Diagnosis not present

## 2017-09-12 DIAGNOSIS — M19012 Primary osteoarthritis, left shoulder: Secondary | ICD-10-CM | POA: Diagnosis not present

## 2017-09-12 DIAGNOSIS — I2584 Coronary atherosclerosis due to calcified coronary lesion: Secondary | ICD-10-CM | POA: Diagnosis not present

## 2017-09-12 DIAGNOSIS — Z955 Presence of coronary angioplasty implant and graft: Secondary | ICD-10-CM | POA: Diagnosis not present

## 2017-09-12 DIAGNOSIS — I350 Nonrheumatic aortic (valve) stenosis: Secondary | ICD-10-CM | POA: Insufficient documentation

## 2017-09-12 DIAGNOSIS — E119 Type 2 diabetes mellitus without complications: Secondary | ICD-10-CM | POA: Insufficient documentation

## 2017-09-12 DIAGNOSIS — Z9889 Other specified postprocedural states: Secondary | ICD-10-CM | POA: Insufficient documentation

## 2017-09-12 DIAGNOSIS — D509 Iron deficiency anemia, unspecified: Secondary | ICD-10-CM | POA: Insufficient documentation

## 2017-09-12 DIAGNOSIS — Z8249 Family history of ischemic heart disease and other diseases of the circulatory system: Secondary | ICD-10-CM | POA: Insufficient documentation

## 2017-09-12 DIAGNOSIS — Z87891 Personal history of nicotine dependence: Secondary | ICD-10-CM | POA: Insufficient documentation

## 2017-09-12 DIAGNOSIS — Z823 Family history of stroke: Secondary | ICD-10-CM | POA: Diagnosis not present

## 2017-09-12 DIAGNOSIS — Z7982 Long term (current) use of aspirin: Secondary | ICD-10-CM | POA: Diagnosis not present

## 2017-09-12 DIAGNOSIS — K219 Gastro-esophageal reflux disease without esophagitis: Secondary | ICD-10-CM | POA: Insufficient documentation

## 2017-09-12 HISTORY — PX: RIGHT/LEFT HEART CATH AND CORONARY ANGIOGRAPHY: CATH118266

## 2017-09-12 LAB — POCT I-STAT 3, VENOUS BLOOD GAS (G3P V)
ACID-BASE DEFICIT: 4 mmol/L — AB (ref 0.0–2.0)
BICARBONATE: 21.3 mmol/L (ref 20.0–28.0)
O2 Saturation: 71 %
PH VEN: 7.36 (ref 7.250–7.430)
TCO2: 22 mmol/L (ref 22–32)
pCO2, Ven: 37.7 mmHg — ABNORMAL LOW (ref 44.0–60.0)
pO2, Ven: 39 mmHg (ref 32.0–45.0)

## 2017-09-12 LAB — POCT I-STAT 3, ART BLOOD GAS (G3+)
ACID-BASE DEFICIT: 3 mmol/L — AB (ref 0.0–2.0)
Bicarbonate: 22 mmol/L (ref 20.0–28.0)
O2 SAT: 97 %
PO2 ART: 94 mmHg (ref 83.0–108.0)
TCO2: 23 mmol/L (ref 22–32)
pCO2 arterial: 37.3 mmHg (ref 32.0–48.0)
pH, Arterial: 7.378 (ref 7.350–7.450)

## 2017-09-12 LAB — GLUCOSE, CAPILLARY
GLUCOSE-CAPILLARY: 282 mg/dL — AB (ref 70–99)
Glucose-Capillary: 307 mg/dL — ABNORMAL HIGH (ref 70–99)

## 2017-09-12 SURGERY — RIGHT/LEFT HEART CATH AND CORONARY ANGIOGRAPHY
Anesthesia: LOCAL

## 2017-09-12 MED ORDER — HEPARIN SODIUM (PORCINE) 1000 UNIT/ML IJ SOLN
INTRAMUSCULAR | Status: AC
  Filled 2017-09-12: qty 1

## 2017-09-12 MED ORDER — IOHEXOL 350 MG/ML SOLN
INTRAVENOUS | Status: DC | PRN
  Administered 2017-09-12: 75 mL via INTRACARDIAC

## 2017-09-12 MED ORDER — MIDAZOLAM HCL 2 MG/2ML IJ SOLN
INTRAMUSCULAR | Status: DC | PRN
  Administered 2017-09-12: 1 mg via INTRAVENOUS

## 2017-09-12 MED ORDER — FENTANYL CITRATE (PF) 100 MCG/2ML IJ SOLN
INTRAMUSCULAR | Status: DC | PRN
  Administered 2017-09-12: 50 ug via INTRAVENOUS

## 2017-09-12 MED ORDER — SODIUM CHLORIDE 0.9 % IV SOLN
INTRAVENOUS | Status: AC
  Administered 2017-09-12: 07:00:00 via INTRAVENOUS

## 2017-09-12 MED ORDER — HEPARIN (PORCINE) IN NACL 1000-0.9 UT/500ML-% IV SOLN
INTRAVENOUS | Status: DC | PRN
  Administered 2017-09-12 (×2): 500 mL

## 2017-09-12 MED ORDER — SODIUM CHLORIDE 0.9 % IV SOLN: 250.0000 mL | INTRAVENOUS | Status: DC | PRN

## 2017-09-12 MED ORDER — VERAPAMIL HCL 2.5 MG/ML IV SOLN
INTRAVENOUS | Status: DC | PRN
  Administered 2017-09-12: 10 mL via INTRA_ARTERIAL

## 2017-09-12 MED ORDER — HEPARIN SODIUM (PORCINE) 1000 UNIT/ML IJ SOLN
INTRAMUSCULAR | Status: DC | PRN
  Administered 2017-09-12: 5000 [IU] via INTRAVENOUS

## 2017-09-12 MED ORDER — LIDOCAINE HCL (PF) 1 % IJ SOLN
INTRAMUSCULAR | Status: AC
  Filled 2017-09-12: qty 30

## 2017-09-12 MED ORDER — VERAPAMIL HCL 2.5 MG/ML IV SOLN
INTRAVENOUS | Status: AC
  Filled 2017-09-12: qty 2

## 2017-09-12 MED ORDER — MIDAZOLAM HCL 2 MG/2ML IJ SOLN
INTRAMUSCULAR | Status: AC
  Filled 2017-09-12: qty 2

## 2017-09-12 MED ORDER — SODIUM CHLORIDE 0.9% FLUSH: 3.0000 mL | Freq: Two times a day (BID) | INTRAVENOUS | Status: DC

## 2017-09-12 MED ORDER — SODIUM CHLORIDE 0.9% FLUSH: 3.0000 mL | INTRAVENOUS | Status: DC | PRN

## 2017-09-12 MED ORDER — ASPIRIN 81 MG PO CHEW: 81.0000 mg | CHEWABLE_TABLET | ORAL | Status: DC

## 2017-09-12 MED ORDER — INSULIN ASPART 100 UNIT/ML ~~LOC~~ SOLN
SUBCUTANEOUS | Status: AC
  Filled 2017-09-12: qty 1

## 2017-09-12 MED ORDER — LIDOCAINE HCL (PF) 1 % IJ SOLN
INTRAMUSCULAR | Status: DC | PRN
  Administered 2017-09-12 (×2): 2 mL

## 2017-09-12 MED ORDER — HEPARIN (PORCINE) IN NACL 1000-0.9 UT/500ML-% IV SOLN
INTRAVENOUS | Status: AC
  Filled 2017-09-12: qty 1000

## 2017-09-12 MED ORDER — INSULIN ASPART 100 UNIT/ML ~~LOC~~ SOLN
0.0000 [IU] | Freq: Three times a day (TID) | SUBCUTANEOUS | Status: DC
  Administered 2017-09-12: 11 [IU] via SUBCUTANEOUS
  Filled 2017-09-12: qty 0.15

## 2017-09-12 MED ORDER — FENTANYL CITRATE (PF) 100 MCG/2ML IJ SOLN
INTRAMUSCULAR | Status: AC
  Filled 2017-09-12: qty 2

## 2017-09-12 SURGICAL SUPPLY — 15 items
CATH 5FR JL3.5 JR4 ANG PIG MP (CATHETERS) ×2 IMPLANT
CATH BALLN WEDGE 5F 110CM (CATHETERS) ×2 IMPLANT
CATH INFINITI 5FR AL1 (CATHETERS) ×2 IMPLANT
CATH INFINITI 5FR JL4 (CATHETERS) ×2 IMPLANT
DEVICE RAD COMP TR BAND LRG (VASCULAR PRODUCTS) ×2 IMPLANT
GLIDESHEATH SLEND SS 6F .021 (SHEATH) ×2 IMPLANT
GUIDEWIRE .025 260CM (WIRE) ×2 IMPLANT
GUIDEWIRE INQWIRE 1.5J.035X260 (WIRE) ×1 IMPLANT
INQWIRE 1.5J .035X260CM (WIRE) ×2
KIT HEART LEFT (KITS) ×2 IMPLANT
PACK CARDIAC CATHETERIZATION (CUSTOM PROCEDURE TRAY) ×2 IMPLANT
SHEATH GLIDE SLENDER 4/5FR (SHEATH) ×2 IMPLANT
TRANSDUCER W/STOPCOCK (MISCELLANEOUS) ×2 IMPLANT
TUBING CIL FLEX 10 FLL-RA (TUBING) ×2 IMPLANT
WIRE EMERALD ST .035X150CM (WIRE) ×2 IMPLANT

## 2017-09-12 NOTE — Interval H&P Note (Signed)
History and Physical Interval Note:  09/12/2017 7:27 AM  Erik Keith  has presented today for cardiac cath with the diagnosis of aortic stenosis  The various methods of treatment have been discussed with the patient and family. After consideration of risks, benefits and other options for treatment, the patient has consented to  Procedure(s): RIGHT/LEFT HEART CATH AND CORONARY ANGIOGRAPHY (N/A) as a surgical intervention .  The patient's history has been reviewed, patient examined, no change in status, stable for surgery.  I have reviewed the patient's chart and labs.  Questions were answered to the patient's satisfaction.    Cath Lab Visit (complete for each Cath Lab visit)  Clinical Evaluation Leading to the Procedure:   ACS: No.  Non-ACS:    Anginal Classification: CCS III  Anti-ischemic medical therapy: No Therapy  Non-Invasive Test Results: No non-invasive testing performed  Prior CABG: No previous CABG         Lauree Chandler

## 2017-09-12 NOTE — Discharge Instructions (Signed)
Radial Site Care °Refer to this sheet in the next few weeks. These instructions provide you with information about caring for yourself after your procedure. Your health care provider [place] also give you more specific instructions. Your treatment has been planned according to current medical practices, but problems sometimes occur. Call your health care provider if you have any problems or questions after your procedure. °What can I expect after the procedure? °After your procedure, it is typical to have the following: °· Bruising at the radial site that usually fades within 1-2 weeks. °· Blood collecting in the tissue (hematoma) that [place] be painful to the touch. It should usually decrease in size and tenderness within 1-2 weeks. ° °Follow these instructions at home: °· Take medicines only as directed by your health care provider. °· You [place] shower 24-48 hours after the procedure or as directed by your health care provider. Remove the bandage (dressing) and gently wash the site with plain soap and water. Pat the area dry with a clean towel. Do not rub the site, because this [place] cause bleeding. °· Do not take baths, swim, or use a hot tub until your health care provider approves. °· Check your insertion site every day for redness, swelling, or drainage. °· Do not apply powder or lotion to the site. °· Do not flex or bend the affected arm for 24 hours or as directed by your health care provider. °· Do not push or pull heavy objects with the affected arm for 24 hours or as directed by your health care provider. °· Do not lift over 10 lb (4.5 kg) for 5 days after your procedure or as directed by your health care provider. °· Ask your health care provider when it is okay to: °? Return to work or school. °? Resume usual physical activities or sports. °? Resume sexual activity. °· Do not drive home if you are discharged the same day as the procedure. Have someone else drive you. °· You [address] drive 24 hours after the procedure  unless otherwise instructed by your health care provider. °· Do not operate machinery or power tools for 24 hours after the procedure. °· If your procedure was done as an outpatient procedure, which means that you went home the same day as your procedure, a responsible adult should be with you for the first 24 hours after you arrive home. °· Keep all follow-up visits as directed by your health care provider. This is important. °Contact a health care provider if: °· You have a fever. °· You have chills. °· You have increased bleeding from the radial site. Hold pressure on the site. °Get help right away if: °· You have unusual pain at the radial site. °· You have redness, warmth, or swelling at the radial site. °· You have drainage (other than a small amount of blood on the dressing) from the radial site. °· The radial site is bleeding, and the bleeding does not stop after 30 minutes of holding steady pressure on the site. °· Your arm or hand becomes pale, cool, tingly, or numb. °This information is not intended to replace advice given to you by your health care provider. Make sure you discuss any questions you have with your health care provider. °Document Released: 01/20/2010 Document Revised: 05/26/2015 Document Reviewed: 07/06/2013 °[employer] Interactive Patient Education © 2018 [employer] ° °

## 2017-09-13 ENCOUNTER — Encounter (HOSPITAL_COMMUNITY): Payer: Self-pay | Admitting: Cardiovascular Disease

## 2017-09-20 ENCOUNTER — Telehealth: Payer: Self-pay

## 2017-09-20 ENCOUNTER — Other Ambulatory Visit: Payer: Self-pay | Admitting: *Deleted

## 2017-09-20 ENCOUNTER — Encounter: Payer: Self-pay | Admitting: *Deleted

## 2017-09-20 ENCOUNTER — Institutional Professional Consult (permissible substitution): Payer: Medicare HMO | Admitting: Thoracic Surgery (Cardiothoracic Vascular Surgery)

## 2017-09-20 ENCOUNTER — Other Ambulatory Visit: Payer: Self-pay

## 2017-09-20 ENCOUNTER — Encounter: Payer: Self-pay | Admitting: Thoracic Surgery (Cardiothoracic Vascular Surgery)

## 2017-09-20 VITALS — BP 130/78 | HR 64 | Resp 18 | Ht 71.0 in | Wt 201.2 lb

## 2017-09-20 DIAGNOSIS — Z91041 Radiographic dye allergy status: Secondary | ICD-10-CM

## 2017-09-20 DIAGNOSIS — I10 Essential (primary) hypertension: Secondary | ICD-10-CM | POA: Insufficient documentation

## 2017-09-20 DIAGNOSIS — I350 Nonrheumatic aortic (valve) stenosis: Secondary | ICD-10-CM

## 2017-09-20 DIAGNOSIS — I25110 Atherosclerotic heart disease of native coronary artery with unstable angina pectoris: Secondary | ICD-10-CM

## 2017-09-20 DIAGNOSIS — I2510 Atherosclerotic heart disease of native coronary artery without angina pectoris: Secondary | ICD-10-CM

## 2017-09-20 DIAGNOSIS — E782 Mixed hyperlipidemia: Secondary | ICD-10-CM | POA: Insufficient documentation

## 2017-09-20 MED ORDER — PREDNISONE 50 MG PO TABS: ORAL_TABLET | ORAL | 0 refills | Status: DC

## 2017-09-20 NOTE — Progress Notes (Signed)
[facility]  CARDIOTHORACIC SURGERY CONSULTATION REPORT  Referring Provider is Burnell Blanks, MD PCP is Raina Mina., MD  Chief Complaint  Patient presents with  . Aortic Stenosis    new patient consultation, TAVR vs CABG     HPI:  Patient is a 77 year old male with history of coronary artery disease status post PCI x2 in 2008, aortic stenosis, hypertension, hyperlipidemia, type 2 diabetes mellitus, iron deficiency anemia, GE reflux disease, and arthritis of the left shoulder who has been referred for surgical consultation to discuss treatment options for management of severe symptomatic aortic stenosis and multivessel coronary artery disease.  The patient's cardiac history dates back to 2008 when he presented with symptomatic coronary artery disease.  He was treated with PCI and stenting of the left anterior descending coronary artery.  Approximately 6 months later he underwent PCI and stenting of the right coronary artery.  He did well for an extended period of time and was followed by Dr. Bettina Gavia in [place].  More recently he has been followed by Dr.McGukin.  Echocardiogram performed in June 2019 reportedly revealed normal left ventricular systolic function with severe aortic stenosis.  He developed progressive symptoms of left shoulder pain and had made tentative plans for left shoulder arthroscopy.  Prior to surgery he was evaluated by anesthesia and felt that formal cardiac evaluation was indicated.  He was referred to [facility] and underwent echocardiogram September 05, 2017.  Echocardiogram confirmed the presence of moderate to severe aortic stenosis with normal left ventricular systolic function.  Peak velocity across aortic valve measured 3.6 m/s corresponding to mean transvalvular gradient estimated 28 mmHg.  The aortic valve is notably trileaflet with severely restricted leaflet mobility.  The DVI was reported 0.26 and  aortic valve area calculated 0.8 cm.  Diagnostic cardiac catheterization was performed on September 12, 2017.  Catheterization revealed continued patency of the previous stents but significant progression of disease with severe three-vessel coronary artery disease.  Left ventricular end-diastolic pressure was normal.  The patient's coronary artery disease was felt to be relatively poorly suited for an attempted percutaneous coronary intervention.  Cardiothoracic surgical consultation was requested.  Patient is married and lives with his wife in [place].  He has been retired from [employer] for approximately 16 years.  He enjoys playing golf and does so at least 3 times per week.  He has remained reasonably active physically.  However, he complains of progressive symptoms of exertional substernal chest pain consistent with classic symptoms of angina pectoris.  Symptoms have become more frequent and severe.  He has had several brief episodes of chest pain at rest.  He has used sublingual nitroglycerin on several occasions, and uses it prophylactically whenever he is going for a walk on the beach.  Symptoms are always relieved by nitroglycerin and/or rest.  He has not had any prolonged episodes of chest discomfort.  Symptoms are associated with progressive decline in exercise tolerance and decreased energy.  He reports only mild exertional shortness of breath.  He has not had resting shortness of breath, PND, orthopnea, or lower extremity edema.  He reports one brief dizzy spell when he was taking a shower several weeks ago.  The patient is not limited by any other significant physical problems with exception of chronic pain in his left shoulder.  Past Medical History:  Diagnosis Date  . Aortic valve stenosis, severe   . CAD S/P percutaneous coronary angioplasty    12-2006  STENT TO LAD;  07-2007 DEStenting TO RCA   . GERD (gastroesophageal reflux disease)   . Hypertension   . Iron deficiency anemia   .  Mixed hyperlipidemia   . OA (osteoarthritis) of shoulder    LEFT SHOULDER AND AC JOINT  . Type 2 diabetes mellitus ([place])     Past Surgical History:  Procedure Laterality Date  . APPENDECTOMY    . BACK SURGERY  x3  . RIGHT/LEFT HEART CATH AND CORONARY ANGIOGRAPHY N/A 09/12/2017   Procedure: RIGHT/LEFT HEART CATH AND CORONARY ANGIOGRAPHY;  Surgeon: Burnell Blanks, MD;  Location: [place] [facility];  Service: Cardiovascular;  Laterality: N/A;  . TONSILLECTOMY      Family History  Problem Relation Age of Onset  . CVA Father   . Heart attack Sister   . Heart attack Brother        3 brothers with CAD    Social History   Socioeconomic History  . Marital status: Married    Spouse name: Lenell Antu  . Number of children: 2  . Years of education: Not on file  . Highest education level: Not on file  Occupational History  . Occupation: Retired-worked at [employer]  . Financial resource strain: Not on file  . Food insecurity:    Worry: Not on file    Inability: Not on file  . Transportation needs:    Medical: Not on file    Non-medical: Not on file  Tobacco Use  . Smoking status: Former Smoker    Years: 40.00    Last attempt to quit: 2002    Years since quitting: 17.7  . Smokeless tobacco: Never Used  Substance and Sexual Activity  . Alcohol use: Never    Frequency: Never  . Drug use: Never  . Sexual activity: Not on file  Lifestyle  . Physical activity:    Days per week: Not on file    Minutes per session: Not on file  . Stress: Not on file  Relationships  . Social connections:    Talks on phone: Not on file    Gets together: Not on file    Attends religious service: Not on file    Active member of club or organization: Not on file    Attends meetings of clubs or organizations: Not on file    Relationship status: Not on file  . Intimate partner violence:    Fear of current or ex partner: Not on file    Emotionally abused: Not on file     Physically abused: Not on file    Forced sexual activity: Not on file  Other Topics Concern  . Not on file  Social History Narrative  . Not on file    Current Outpatient Medications  Medication Sig Dispense Refill  . aspirin EC 81 MG tablet Take 81 mg by mouth daily.    Marland Kitchen b complex vitamins tablet Take 1 tablet by mouth daily.    . ferrous sulfate 324 (65 Fe) MG TBEC Take 1 tablet by mouth daily.     Marland Kitchen gabapentin (NEURONTIN) 300 MG capsule Take 600 mg by mouth at bedtime.     Marland Kitchen glipiZIDE (GLUCOTROL XL) 5 MG 24 hr tablet Take 5 mg by mouth 2 (two) times daily.    . Glucos-Chond-Hyal Ac-Ca Fructo (MOVE FREE JOINT HEALTH ADVANCE PO) Take 1 tablet by mouth daily as needed (joint pain).    Marland Kitchen HYDROcodone-acetaminophen (NORCO) 10-325 MG tablet Take 1 tablet  by mouth every 6 (six) hours as needed for moderate pain.     . metFORMIN (GLUCOPHAGE) 500 MG tablet Take 500 mg by mouth 2 (two) times daily with a meal.     . nitroGLYCERIN (NITROSTAT) 0.4 MG SL tablet Place 0.4 mg under the tongue every 5 (five) minutes as needed for chest pain.   1  . pantoprazole (PROTONIX) 40 MG tablet Take 40 mg by mouth daily.    . Polyethylene Glycol 3350 (MIRALAX PO) Take 17 g by mouth daily.     . ramipril (ALTACE) 5 MG capsule Take 5 mg by mouth 2 (two) times daily.    . simvastatin (ZOCOR) 40 MG tablet Take 40 mg by mouth daily.    Marland Kitchen zolpidem (AMBIEN) 10 MG tablet Take 10 mg by mouth at bedtime as needed for sleep.     No current facility-administered medications for this visit.     Allergies  Allergen Reactions  . Contrast Media [Iodinated Diagnostic Agents] Hives  . Penicillins Hives    Has patient had a PCN reaction causing immediate rash, facial/tongue/throat swelling, SOB or lightheadedness with hypotension: unkn Has patient had a PCN reaction causing severe rash involving mucus membranes or skin necrosis: unkn Has patient had a PCN reaction that required hospitalization: unkn Has patient had a PCN  reaction occurring within the last 10 years: no If all of the above answers are "NO", then Votta proceed with Cephalosporin use.       Review of Systems:   General:  normal appetite, decreased energy, no weight gain, no weight loss, no fever  Cardiac:  + chest pain with exertion, occasional chest pain at rest, +SOB with exertion, no resting SOB, no PND, no orthopnea, no palpitations, no arrhythmia, no atrial fibrillation, no LE edema, + dizzy spells, no syncope  Respiratory:  no shortness of breath, no home oxygen, no productive cough, + dry cough, no bronchitis, no wheezing, no hemoptysis, no asthma, no pain with inspiration or cough, no sleep apnea, no CPAP at night  GI:   no difficulty swallowing, no reflux, no frequent heartburn, no hiatal hernia, no abdominal pain, no constipation, no diarrhea, no hematochezia, no hematemesis, no melena  GU:   no dysuria,  + frequency, no urinary tract infection, no hematuria, no enlarged prostate, no kidney stones, no kidney disease  Vascular:  no pain suggestive of claudication, no pain in feet, no leg cramps, no varicose veins, no DVT, no non-healing foot ulcer  Neuro:   no stroke, no TIA's, no seizures, no headaches, no temporary blindness one eye,  no slurred speech, no peripheral neuropathy, no chronic pain, no instability of gait, no memory/cognitive dysfunction  Musculoskeletal: + arthritis, no joint swelling, no myalgias, no difficulty walking, normal mobility   Skin:   no rash, no itching, no skin infections, no pressure sores or ulcerations  Psych:   no anxiety, no depression, no nervousness, no unusual recent stress  Eyes:   no blurry vision, no floaters, no recent vision changes, + wears glasses only for reading  ENT:   no hearing loss, no loose or painful teeth, edentulous with full dentures  Hematologic:  no easy bruising, no abnormal bleeding, no clotting disorder, no frequent epistaxis  Endocrine:  + diabetes, does check CBG's at  home           Physical Exam:   BP 130/78 (BP Location: Left Arm, Patient Position: Sitting, Cuff Size: Normal)   Pulse 64   Resp 18  Ht 5\' 11"  (1.803 m)   Wt 201 lb 3.2 oz (91.3 kg)   SpO2 96% Comment: RA  BMI 28.06 kg/m   General:    well-appearing  HEENT:  Unremarkable   Neck:   no JVD, no bruits, no adenopathy   Chest:   clear to auscultation, symmetrical breath sounds, no wheezes, no rhonchi   CV:   RRR, grade III/VI crescendo/decrescendo murmur heard best at RSB,  no diastolic murmur  Abdomen:  soft, non-tender, no masses   Extremities:  warm, well-perfused, pulses palpable, no LE edema  Rectal/GU  Deferred  Neuro:   Grossly non-focal and symmetrical throughout  Skin:   Clean and dry, no rashes, no breakdown   Diagnostic Tests:  Transthoracic Echocardiography  Patient:    Tavis, Giulian Dexter MR #:       [id number] Study Date: 09/05/2017 Gender:     M Age:        32 Height:     180.3 cm Weight:     92.3 kg BSA:        2.17 m^2 Pt. Status: Room:   SONOGRAPHER  Diamond Nickel  ATTENDING    Darlina Guys, MD  Willia Craze, Christopher  REFERRING    [place], [place], Outpatient  cc:  ------------------------------------------------------------------- LV EF: 60% -   65%  ------------------------------------------------------------------- Indications:      I35.0 Severe aortic stenosis.  ------------------------------------------------------------------- History:   PMH:  Anemia.  Coronary artery disease.  Risk factors: Hypertension. Diabetes mellitus. Dyslipidemia.  ------------------------------------------------------------------- Study Conclusions  - Left ventricle: The cavity size was normal. Wall thickness was   increased in a pattern of severe LVH. Systolic function was   normal. The estimated ejection fraction was in the range of 60%   to 65%. Wall motion was normal; there were no regional wall   motion  abnormalities. Doppler parameters are consistent with   abnormal left ventricular relaxation (grade 1 diastolic   dysfunction). - Ventricular septum: Septal motion showed abnormal function and   dyssynergy. - Aortic valve: Valve mobility was restricted. There was moderate   to severe stenosis. There was mild regurgitation. Peak velocity   (S): 360 cm/s. Mean gradient (S): 28 mm Hg. Valve area (VTI): 0.9   cm^2. Valve area (Vmax): 0.77 cm^2. Valve area (Vmean): 0.8 cm^2. - Mitral valve: There was mild regurgitation. - Left atrium: Volume/bsa, ES, (1-plane Simpson&'s, A2C): 27.1   ml/m^2.  ------------------------------------------------------------------- Study data:  No prior study was available for comparison.  Study status:  Routine.  Procedure:  The patient reported no pain pre or post test. Transthoracic echocardiography. Image quality was adequate.  Study completion:  There were no complications. Transthoracic echocardiography.  M-mode, complete 2D, spectral Doppler, and color Doppler.  Birthdate:  Patient birthdate: 1940/04/03.  Age:  Patient is 77 yr old.  Sex:  Gender: male. BMI: 28.4 kg/m^2.  Blood pressure:     126/87  Patient status: Outpatient.  Study date:  Study date: 09/05/2017. Study time: 01:47 PM.  Location:  [facility]  -------------------------------------------------------------------  ------------------------------------------------------------------- Left ventricle:  The cavity size was normal. Wall thickness was increased in a pattern of severe LVH. Systolic function was normal. The estimated ejection fraction was in the range of 60% to 65%. Wall motion was normal; there were no regional wall motion abnormalities. Doppler parameters are consistent with abnormal left ventricular relaxation (grade 1 diastolic dysfunction).  ------------------------------------------------------------------- Aortic valve:   Trileaflet; severely thickened,  severely  calcified leaflets. Valve mobility was restricted.  Doppler:   There was moderate to severe stenosis.   There was mild regurgitation.    VTI ratio of LVOT to aortic valve: 0.29. Valve area (VTI): 0.9 cm^2. Indexed valve area (VTI): 0.42 cm^2/m^2. Peak velocity ratio of LVOT to aortic valve: 0.24. Valve area (Vmax): 0.77 cm^2. Indexed valve area (Vmax): 0.35 cm^2/m^2. Mean velocity ratio of LVOT to aortic valve: 0.26. Valve area (Vmean): 0.8 cm^2. Indexed valve area (Vmean): 0.37 cm^2/m^2.    Mean gradient (S): 28 mm Hg. Peak gradient (S): 52 mm Hg.  ------------------------------------------------------------------- Aorta:  Aortic root: The aortic root was normal in size.  ------------------------------------------------------------------- Mitral valve:   Structurally normal valve.   Mobility was not restricted.  Doppler:  Transvalvular velocity was within the normal range. There was no evidence for stenosis. There was mild regurgitation.  ------------------------------------------------------------------- Left atrium:  The atrium was normal in size.  ------------------------------------------------------------------- Right ventricle:  The cavity size was normal. Wall thickness was normal. Systolic function was normal.  ------------------------------------------------------------------- Ventricular septum:   Septal motion showed abnormal function and dyssynergy.  ------------------------------------------------------------------- Pulmonic valve:   Poorly visualized.  Structurally normal valve. Cusp separation was normal.  Doppler:  Transvalvular velocity was within the normal range. There was no evidence for stenosis. There was no regurgitation.  ------------------------------------------------------------------- Tricuspid valve:   Structurally normal valve.    Doppler: Transvalvular velocity was within the normal range. There was  no regurgitation.  ------------------------------------------------------------------- Pulmonary artery:   The main pulmonary artery was normal-sized. Systolic pressure was within the normal range.  ------------------------------------------------------------------- Right atrium:  The atrium was normal in size.  ------------------------------------------------------------------- Pericardium:  There was no pericardial effusion.  ------------------------------------------------------------------- Systemic veins: Inferior vena cava: The vessel was normal in size.  ------------------------------------------------------------------- Measurements   Left ventricle                           Value          Reference  LV ID, ED, PLAX chordal          (L)     39.8  mm       43 - 52  LV ID, ES, PLAX chordal                  35.8  mm       23 - 38  LV fx shortening, PLAX chordal   (L)     10    %        >=29  LV PW thickness, ED                      14.4  mm       ----------  IVS/LV PW ratio, ED              (H)     1.37           <=1.3  Stroke volume, 2D                        64    ml       ----------  Stroke volume/bsa, 2D                    30    ml/m^2   ----------  LV e&', lateral  5.66  cm/s     ----------  LV E/e&', lateral                         10.37          ----------  LV e&', medial                            5.33  cm/s     ----------  LV E/e&', medial                          11.01          ----------  LV e&', average                           5.5   cm/s     ----------  LV E/e&', average                         10.68          ----------    Ventricular septum                       Value          Reference  IVS thickness, ED                        19.7  mm       ----------    LVOT                                     Value          Reference  LVOT ID, S                               20    mm       ----------  LVOT area                                 3.14  cm^2     ----------  LVOT peak velocity, S                    87.9  cm/s     ----------  LVOT mean velocity, S                    55.8  cm/s     ----------  LVOT VTI, S                              20.4  cm       ----------    Aortic valve                             Value          Reference  Aortic valve peak velocity, S            360   cm/s     ----------  Aortic valve mean  velocity, S            218   cm/s     ----------  Aortic valve VTI, S                      70.8  cm       ----------  Aortic mean gradient, S                  28    mm Hg    ----------  Aortic peak gradient, S                  52    mm Hg    ----------  VTI ratio, LVOT/AV                       0.29           ----------  Aortic valve area, VTI                   0.9   cm^2     ----------  Aortic valve area/bsa, VTI               0.42  cm^2/m^2 ----------  Velocity ratio, peak, LVOT/AV            0.24           ----------  Aortic valve area, peak velocity         0.77  cm^2     ----------  Aortic valve area/bsa, peak              0.35  cm^2/m^2 ----------  velocity  Velocity ratio, mean, LVOT/AV            0.26           ----------  Aortic valve area, mean velocity         0.8   cm^2     ----------  Aortic valve area/bsa, mean              0.37  cm^2/m^2 ----------  velocity  Aortic regurg peak velocity              191   cm/s     ----------  Aortic regurg pressure half-time         643   ms       ----------  Aortic regurg peak gradient              15    mm Hg    ----------    Aorta                                    Value          Reference  Aortic root ID, ED                       34    mm       ----------    Left atrium                              Value          Reference  LA ID, A-P, ES  48    mm       ----------  LA ID/bsa, A-P                   (H)     2.21  cm/m^2   <=2.2  LA volume, S                             61.1  ml       ----------  LA volume/bsa, S                          28.2  ml/m^2   ----------  LA volume, ES, 1-p A4C                   60    ml       ----------  LA volume/bsa, ES, 1-p A4C               27.7  ml/m^2   ----------  LA volume, ES, 1-p A2C                   58.8  ml       ----------  LA volume/bsa, ES, 1-p A2C               27.1  ml/m^2   ----------    Mitral valve                             Value          Reference  Mitral E-wave peak velocity              58.7  cm/s     ----------  Mitral A-wave peak velocity              103   cm/s     ----------  Mitral deceleration time         (H)     394   ms       150 - 230  Mitral E/A ratio, peak                   0.6            ----------    Right atrium                             Value          Reference  RA ID, S-I, ES, A4C              (H)     49.6  mm       34 - 49  RA area, ES, A4C                         13.1  cm^2     8.3 - 19.5  RA volume, ES, A/L                       28    ml       ----------  RA volume/bsa, ES, A/L                   12.9  ml/m^2   ----------    Right ventricle  Value          Reference  RV s&', lateral, S                        11.5  cm/s     ----------  Legend: (L)  and  (H)  mark values outside specified reference range.  ------------------------------------------------------------------- Prepared and Electronically Authenticated by  Candee Furbish, M.D. 2019-09-05T16:08:09   RIGHT/LEFT HEART CATH AND CORONARY ANGIOGRAPHY  Conclusion     Mid RCA to Dist RCA lesion is 60% stenosed.  Mid RCA lesion is 70% stenosed.  RPDA lesion is 99% stenosed.  Prox Cx to Mid Cx lesion is 99% stenosed.  Ost Cx to Prox Cx lesion is 80% stenosed.  Ost LAD to Prox LAD lesion is 30% stenosed.  Mid LAD-1 lesion is 60% stenosed.  Mid LAD-2 lesion is 70% stenosed.  LV end diastolic pressure is normal.   1. Severe triple vessel CAD 2. Moderately severe stenosis in the mid LAD. Patent stent proximal LAD 3. Severe stenosis involving a  heavily calcified segment of the proximal Circumflex. Subtotal occlusion of the mid segment of the large branching obtuse marginal. The stenosis involves both sub-branches of the obtuse marginal 4. The RCA is a large dominant vessel with a proximal aneurysmal segment followed by an old stent in the mid vessel. There is moderately severe disease in the mid vessel prior to the old stent and within the old stent.  5. Severe aortic stenosis (peak to peak gradient 40 mmHg, mean gradient 33.6 mmHg, AVA 0.87 cm2). By echo the valve leaflets appear to be thickened and calcified and they do not open well. Dimensionless index below 0.25 and AVA 0.77cm2 by echo.   Recommendations: He has complex multivessel CAD and severe aortic valve stenosis. His CAD would be difficult to treat with PCI given the extensive disease in the Circumflex. His distal targets are patent. I think we should consider surgical AVR and CABG. I will arrange for him to be seen by Dr. Roxy Manns or Dr. Cyndia Bent to review options for surgery. He would be a good candidate for TAVR but given his extensive CAD, a percutaneous approach does not seem like the optimal approach.    Indications   Severe aortic stenosis [I35.0 (ICD-10-CM)]  Coronary artery disease involving native coronary artery of native heart with unstable angina pectoris (HCC) [I25.110 (ICD-10-CM)]  Procedural Details/Technique   Technical Details Indication: 77 yo male with HTN, HLD, aortic stenosis and CAD with recent chest pain and dyspnea.   Procedure: The risks, benefits, complications, treatment options, and expected outcomes were discussed with the patient. The patient and/or family concurred with the proposed plan, giving informed consent. The patient was brought to the cath lab after IV hydration was given. The patient was sedated with Versed and Fentanyl. I changed the IV catheter in the right antecubital vein for a 5 French sheath. Right heart catheterization performed with a  balloon tipped catheter. The right wrist was prepped and draped in a sterile fashion. 1% lidocaine was used for local anesthesia. Using the modified Seldinger access technique, a 5 French sheath was placed in the right radial artery. 3 mg Verapamil was given through the sheath. 5000 units IV heparin was given. Standard diagnostic catheters were used to perform selective coronary angiography. I crossed the aortic valve with an AL-1 catheter and a straight wire. LV pressures measured. No LV gram. The sheath was removed from the right radial artery and a  Terumo hemostasis band was applied at the arteriotomy site on the right wrist.     Estimated blood loss <50 mL.  During this procedure the patient was administered the following to achieve and maintain moderate conscious sedation: Versed 1 mg, Fentanyl 50 mcg, while the patient's heart rate, blood pressure, and oxygen saturation were continuously monitored. The period of conscious sedation was 47 minutes, of which I was present face-to-face 100% of this time.  Complications   Complications documented before study signed (09/12/2017 9:03 AM EDT)    RIGHT/LEFT HEART CATH AND CORONARY ANGIOGRAPHY   None Documented by Burnell Blanks, MD 09/12/2017 8:53 AM EDT  Time Range: Intraprocedure     None Documented by Burnell Blanks, MD 09/12/2017 8:53 AM EDT  Time Range: Intraprocedure      Coronary Findings   Diagnostic  Dominance: Right  Left Anterior Descending  Vessel is large.  Ost LAD to Prox LAD lesion 30% stenosed  Ost LAD to Prox LAD lesion is 30% stenosed. The lesion was previously treated using a stent (unknown type) over 2 years ago.  Mid LAD-1 lesion 60% stenosed  Mid LAD-1 lesion is 60% stenosed.  Mid LAD-2 lesion 70% stenosed  Mid LAD-2 lesion is 70% stenosed.  Left Circumflex  Ost Cx to Prox Cx lesion 80% stenosed  Ost Cx to Prox Cx lesion is 80% stenosed. The lesion is calcified.  Prox Cx to Mid Cx lesion 99%  stenosed  Prox Cx to Mid Cx lesion is 99% stenosed.  First Obtuse Marginal Branch  Vessel is large in size.  Second Obtuse Marginal Branch  Vessel is moderate in size.  Third Obtuse Marginal Branch  Vessel is moderate in size.  Right Coronary Artery  Mid RCA lesion 70% stenosed  Mid RCA lesion is 70% stenosed.  Mid RCA to Dist RCA lesion 60% stenosed  Mid RCA to Dist RCA lesion is 60% stenosed. The lesion was previously treated using a stent (unknown type) over 2 years ago.  Right Posterior Descending Artery  RPDA lesion 99% stenosed  RPDA lesion is 99% stenosed.  Intervention   No interventions have been documented.  Right Heart   Right Heart Pressures LV EDP is normal.  Coronary Diagrams   Diagnostic Diagram       Implants    No implant documentation for this case.  MERGE Images   Show images for CARDIAC CATHETERIZATION   Link to Procedure Log   Procedure Log    Hemo Data    Most Recent Value  Fick Cardiac Output 5.17 L/min  Fick Cardiac Output Index 2.43 (L/min)/BSA  Aortic Mean Gradient 33.6 mmHg  Aortic Peak Gradient 40 mmHg  Aortic Valve Area 0.87  Aortic Value Area Index 0.41 cm2/BSA  RA A Wave 5 mmHg  RA V Wave 3 mmHg  RA Mean 2 mmHg  RV Systolic Pressure 37 mmHg  RV Diastolic Pressure 0 mmHg  RV EDP 4 mmHg  PA Systolic Pressure 29 mmHg  PA Diastolic Pressure 11 mmHg  PA Mean 18 mmHg  PW A Wave 8 mmHg  PW V Wave 6 mmHg  PW Mean 6 mmHg  AO Systolic Pressure 833 mmHg  AO Diastolic Pressure 70 mmHg  AO Mean 90 mmHg  LV Systolic Pressure 825 mmHg  LV Diastolic Pressure 6 mmHg  LV EDP 13 mmHg  AOp Systolic Pressure 053 mmHg  AOp Diastolic Pressure 71 mmHg  AOp Mean Pressure 98 mmHg  LVp Systolic Pressure 976 mmHg  LVp Diastolic  Pressure 5 mmHg  LVp EDP Pressure 14 mmHg  QP/QS 1  TPVR Index 7.42 HRUI  TSVR Index 37.1 HRUI  PVR SVR Ratio 0.14  TPVR/TSVR Ratio 0.2    STS Risk Calculator  Procedure: AVR + CAB CALCULATE   Risk of  Mortality:  2.222% Renal Failure:  2.739% Permanent Stroke:  2.338% Prolonged Ventilation:  9.295% DSW Infection:  0.180% Reoperation:  4.489% Morbidity or Mortality:  16.395% Short Length of Stay:  28.741% Long Length of Stay:  7.093%    Impression:  Patient has severe three-vessel coronary artery disease and stage D severe symptomatic aortic stenosis.  He presents with a long history of progressive symptoms of exertional substernal chest pain consistent with angina pectoris.  Recently symptoms have been occurring with relatively low level activity and occasionally even at rest.  Symptoms are always relieved by rest and/or administration of sublingual nitroglycerin.  He also describes mild symptoms of exertional shortness of breath and progressive decline in exercise tolerance consistent with chronic diastolic congestive heart failure, New York Heart Association functional class I-II.  I personally reviewed the patient's recent transthoracic echocardiogram and diagnostic cardiac catheterization.  Echocardiogram confirmed the presence of severe aortic stenosis with normal left ventricular systolic function.  The aortic valve is trileaflet.  All 3 leaflets were thickened and partially calcified with severely restricted leaflet mobility.  Peak velocity across the aortic valve measured 3.6 m/s corresponding to mean transvalvular gradient estimated 26 mmHg.  The DVI was reported 0.26 and aortic valve area calculated 0.8 cm.  Diagnostic cardiac catheterization confirmed the presence of severe aortic stenosis with mean transvalvular gradient measured 33.6 mmHg corresponding to aortic valve area calculated 0.87 cm.  Coronary angiography revealed severe three-vessel coronary artery disease with continued patency of stents in the left anterior descending coronary artery and the right coronary artery but significant progression of disease including 70% stenosis of mid left anterior descending coronary  artery and high-grade stenosis of left circumflex system with coronary anatomy unfavorable for percutaneous core intervention.  The patient is otherwise reasonably healthy and physically active.  Risks associated with surgery should be reasonably low.  I agree the patient would best be treated with conventional surgical aortic valve replacement and coronary artery bypass grafting.   Plan:  The patient and his wife were counseled at length regarding treatment alternatives for management of severe symptomatic aortic stenosis and multivessel coronary artery disease. Alternative approaches such as conventional surgical aortic valve replacement with coronary artery bypass grafting, transcatheter aortic valve replacement with or without percutaneous coronary intervention, and continued medical therapy without intervention were compared and contrasted at length.  The risks associated with conventional surgery were discussed in detail, as were expectations for post-operative convalescence.   The patient hopes to proceed with surgery as soon as practical.  We tentatively plan for surgery on October 08, 2017.  Discussion was held comparing the relative risks of mechanical valve replacement with need for lifelong anticoagulation versus use of a bioprosthetic tissue valve and the associated potential for late structural valve deterioration and failure.  This discussion was placed in the context of the patient's particular circumstances, and as a result the patient specifically requests that their valve be replaced using a bioprosthetic tissue valve .    The patient and his wife understand and accept all potential associated risks of surgery including but not limited to risk of death, stroke, myocardial infarction, congestive heart failure, respiratory failure, renal failure, pneumonia, bleeding requiring blood transfusion and or reexploration, arrhythmia,  heart block or bradycardia requiring permanent pacemaker, aortic  dissection or other major vascular complication, pleural effusions or other delayed complications related to continued congestive heart failure, late recurrence of symptomatic ischemic heart disease, and other late complications related to valve replacement including structural valve deterioration and failure, thrombosis, endocarditis, or paravalvular leak.  Prior to surgery the patient will undergo cardiac gated CT angiogram of the heart to further evaluate the anatomy of the aortic valve and aortic root to facilitate optimal surgical planning.  The patient has been advised to avoid any kind of strenuous physical activity between now and the time of surgery.  He has also been reminded that he should contact EMS or go directly to the emergency room should he develop substernal chest pain unrelieved by the administration of nitroglycerin.  All questions have been answered.   I spent in excess of 90 minutes during the conduct of this office consultation and >50% of this time involved direct face-to-face encounter with the patient for counseling and/or coordination of their care.     Valentina Gu. Roxy Manns, MD 09/20/2017 9:32 AM

## 2017-09-20 NOTE — Telephone Encounter (Signed)
RX for IV Contrast Pre-Med Regime called to [facility]. Prednisone 50 mg po 13 hours prior to CT scan then again Prednisone 50 mg po 7 hours prior to CT scan then again  Prednisone 50 mg and Benadryl 50 mg po 1 hours prior to CT scan  Patient is aware

## 2017-09-20 NOTE — Patient Instructions (Addendum)
Avoid any strenuous activity and use nitroglycerine as needed for episodes of chest pain.  Call 911 if you have chest pain unrelieved by nitroglycerine x3  Continue taking all current medications without change through the day before surgery.  Have nothing to eat or drink after midnight the night before surgery.  On the morning of surgery take only Protonix with a sip of water.

## 2017-09-25 ENCOUNTER — Encounter: Payer: Self-pay | Admitting: Thoracic Surgery (Cardiothoracic Vascular Surgery)

## 2017-10-01 NOTE — Progress Notes (Addendum)
PCP: Gilford Rile, MD  Cardiologist: Lauree Chandler, MD  EKG: 09/05/17 and obtained today per order  Stress test:  ECHO: 09/05/17 in Epic  Cardiac Cath: 09/12/17 in EPIC  Chest x-ray: 10/02/17 in [facility]

## 2017-10-01 NOTE — Pre-Procedure Instructions (Signed)
Erik Keith  10/01/2017      [id number] - [place], [place] - [address] [place] [address] Phone: [phone] Fax: [phone]    Your procedure is scheduled on 10/08/17  Report to [facility] Admitting at 530 AM.  Call this number if you have problems the morning of surgery:  [phone]   Remember:  Do not eat or drink after midnight.    Take these medicines the morning of surgery with A SIP OF WATER  Gabapentin (neurontin) Hydrocodone-acetaminophen (norco)-if needed Nitrostat-if needed for chest pain  Follow your surgeon's instructions on when to hold/resume aspirin.  If no instructions were given call the office to determine how they would like to you take aspirin   7 days prior to surgery STOP taking any Aleve, Naproxen, Ibuprofen, Motrin, Advil, Goody's, BC's, all herbal medications, fish oil, and all vitamins  WHAT DO I DO ABOUT MY DIABETES MEDICATION?  Marland Kitchen Do not take oral diabetes medicines (pills) the morning of surgery-glipizide (glucotrol-XL) or metformin (glucophage).  . THE NIGHT BEFORE SURGERY, do NOT take glipizide (glucotrol)    Reviewed and Endorsed by [facility] Patient Education Committee, August 2015  How to Manage Your Diabetes Before and After Surgery  Why is it important to control my blood sugar before and after surgery? . Improving blood sugar levels before and after surgery helps healing and can limit problems. . A way of improving blood sugar control is eating a healthy diet by: o  Eating less sugar and carbohydrates o  Increasing activity/exercise o  Talking with your doctor about reaching your blood sugar goals . High blood sugars (greater than 180 mg/dL) can raise your risk of infections and slow your recovery, so you will need to focus on controlling your diabetes during the weeks before surgery. . Make sure that the doctor who takes care of your diabetes knows  about your planned surgery including the date and location.  How do I manage my blood sugar before surgery? . Check your blood sugar at least 4 times a day, starting 2 days before surgery, to make sure that the level is not too high or low. o Check your blood sugar the morning of your surgery when you wake up and every 2 hours until you get to the Short Stay unit. . If your blood sugar is less than 70 mg/dL, you will need to treat for low blood sugar: o Do not take insulin. o Treat a low blood sugar (less than 70 mg/dL) with  cup of clear juice (cranberry or apple), 4 glucose tablets, OR glucose gel. Recheck blood sugar in 15 minutes after treatment (to make sure it is greater than 70 mg/dL). If your blood sugar is not greater than 70 mg/dL on recheck, call [phone] o  for further instructions. . Report your blood sugar to the short stay nurse when you get to Short Stay.  . If you are admitted to the hospital after surgery: o Your blood sugar will be checked by the staff and you will probably be given insulin after surgery (instead of oral diabetes medicines) to make sure you have good blood sugar levels. o The goal for blood sugar control after surgery is 80-180 mg/dL.   [place]- Preparing For Surgery  Before surgery, you can play an important role. Because skin is not sterile, your skin needs to be as free of germs as possible. You  can reduce the number of germs on your skin by washing with CHG (chlorahexidine gluconate) Soap before surgery.  CHG is an antiseptic cleaner which kills germs and bonds with the skin to continue killing germs even after washing.    Oral Hygiene is also important to reduce your risk of infection.  Remember - BRUSH YOUR TEETH THE MORNING OF SURGERY WITH YOUR REGULAR TOOTHPASTE  Please do not use if you have an allergy to CHG or antibacterial soaps. If your skin becomes reddened/irritated stop using the CHG.  Do not shave (including legs and underarms) for  at least 48 hours prior to first CHG shower. It is OK to shave your face.  Please follow these instructions carefully.   1. Shower the NIGHT BEFORE SURGERY and the MORNING OF SURGERY with CHG.   2. If you chose to wash your hair, wash your hair first as usual with your normal shampoo.  3. After you shampoo, rinse your hair and body thoroughly to remove the shampoo.  4. Use CHG as you would any other liquid soap. You can apply CHG directly to the skin and wash gently with a scrungie or a clean washcloth.   5. Apply the CHG Soap to your body ONLY FROM THE NECK DOWN.  Do not use on open wounds or open sores. Avoid contact with your eyes, ears, mouth and genitals (private parts). Wash Face and genitals (private parts)  with your normal soap.  6. Wash thoroughly, paying special attention to the area where your surgery will be performed.  7. Thoroughly rinse your body with warm water from the neck down.  8. DO NOT shower/wash with your normal soap after using and rinsing off the CHG Soap.  9. Pat yourself dry with a CLEAN TOWEL.  10. Wear CLEAN PAJAMAS to bed the night before surgery, wear comfortable clothes the morning of surgery  11. Place CLEAN SHEETS on your bed the night of your first shower and DO NOT SLEEP WITH PETS.   Day of Surgery:  Do not apply any deodorants/lotions.  Please wear clean clothes to the hospital/surgery center.   Remember to brush your teeth WITH YOUR REGULAR TOOTHPASTE.   Do not wear jewelry  Do not wear lotions, powders, or colognes, or deodorant.   Men Reader shave face and neck.  Do not bring valuables to the hospital.  [facility] is not responsible for any belongings or valuables.  Contacts, dentures or bridgework Baig not be worn into surgery.  Leave your suitcase in the car.  After surgery it Pfister be brought to your room.  For patients admitted to the hospital, discharge time will be determined by your treatment team.  Patients discharged the day of  surgery will not be allowed to drive home.   Please read over the following fact sheets that you were given. Pain Booklet, Coughing and Deep Breathing, MRSA Information and Surgical Site Infection Prevention

## 2017-10-02 ENCOUNTER — Encounter (HOSPITAL_COMMUNITY): Payer: Self-pay

## 2017-10-02 ENCOUNTER — Ambulatory Visit (HOSPITAL_BASED_OUTPATIENT_CLINIC_OR_DEPARTMENT_OTHER)
Admission: RE | Admit: 2017-10-02 | Discharge: 2017-10-02 | Disposition: A | Discharge status: Home / Self Care | Payer: Medicare HMO | Source: Ambulatory Visit | Attending: Thoracic Surgery (Cardiothoracic Vascular Surgery) | Admitting: Thoracic Surgery (Cardiothoracic Vascular Surgery)

## 2017-10-02 ENCOUNTER — Encounter (HOSPITAL_COMMUNITY): Payer: Self-pay | Admitting: Vascular Surgery

## 2017-10-02 ENCOUNTER — Encounter (HOSPITAL_COMMUNITY)
Admission: RE | Admit: 2017-10-02 | Discharge: 2017-10-02 | Disposition: A | Discharge status: Home / Self Care | Payer: Medicare HMO | Source: Ambulatory Visit | Attending: Thoracic Surgery (Cardiothoracic Vascular Surgery) | Admitting: Thoracic Surgery (Cardiothoracic Vascular Surgery)

## 2017-10-02 ENCOUNTER — Ambulatory Visit (HOSPITAL_COMMUNITY): Payer: Medicare HMO

## 2017-10-02 ENCOUNTER — Ambulatory Visit (HOSPITAL_COMMUNITY)
Admission: RE | Admit: 2017-10-02 | Discharge: 2017-10-02 | Disposition: A | Discharge status: Home / Self Care | Payer: Medicare HMO | Source: Ambulatory Visit | Attending: Cardiovascular Disease | Admitting: Cardiovascular Disease

## 2017-10-02 ENCOUNTER — Inpatient Hospital Stay (HOSPITAL_COMMUNITY)
Admission: AD | Admit: 2017-10-02 | Discharge: 2017-10-08 | DRG: 217 | Disposition: A | Discharge status: Home / Self Care | Payer: Medicare HMO | Attending: Thoracic Surgery (Cardiothoracic Vascular Surgery) | Admitting: Thoracic Surgery (Cardiothoracic Vascular Surgery)

## 2017-10-02 ENCOUNTER — Other Ambulatory Visit: Payer: Self-pay

## 2017-10-02 ENCOUNTER — Encounter (HOSPITAL_COMMUNITY)
Admission: AD | Disposition: A | Discharge status: Home / Self Care | Payer: Self-pay | Source: Home / Self Care | Attending: Thoracic Surgery (Cardiothoracic Vascular Surgery)

## 2017-10-02 ENCOUNTER — Encounter: Payer: Self-pay | Admitting: Thoracic Surgery (Cardiothoracic Vascular Surgery)

## 2017-10-02 DIAGNOSIS — D62 Acute posthemorrhagic anemia: Secondary | ICD-10-CM | POA: Diagnosis not present

## 2017-10-02 DIAGNOSIS — Z7984 Long term (current) use of oral hypoglycemic drugs: Secondary | ICD-10-CM | POA: Diagnosis not present

## 2017-10-02 DIAGNOSIS — K5730 Diverticulosis of large intestine without perforation or abscess without bleeding: Secondary | ICD-10-CM

## 2017-10-02 DIAGNOSIS — I10 Essential (primary) hypertension: Secondary | ICD-10-CM | POA: Diagnosis not present

## 2017-10-02 DIAGNOSIS — Z79891 Long term (current) use of opiate analgesic: Secondary | ICD-10-CM

## 2017-10-02 DIAGNOSIS — I2510 Atherosclerotic heart disease of native coronary artery without angina pectoris: Secondary | ICD-10-CM | POA: Insufficient documentation

## 2017-10-02 DIAGNOSIS — Z79899 Other long term (current) drug therapy: Secondary | ICD-10-CM | POA: Diagnosis not present

## 2017-10-02 DIAGNOSIS — Q341 Congenital cyst of mediastinum: Secondary | ICD-10-CM | POA: Insufficient documentation

## 2017-10-02 DIAGNOSIS — E118 Type 2 diabetes mellitus with unspecified complications: Secondary | ICD-10-CM | POA: Diagnosis not present

## 2017-10-02 DIAGNOSIS — Z953 Presence of xenogenic heart valve: Secondary | ICD-10-CM

## 2017-10-02 DIAGNOSIS — R911 Solitary pulmonary nodule: Secondary | ICD-10-CM | POA: Diagnosis not present

## 2017-10-02 DIAGNOSIS — K219 Gastro-esophageal reflux disease without esophagitis: Secondary | ICD-10-CM | POA: Diagnosis present

## 2017-10-02 DIAGNOSIS — J439 Emphysema, unspecified: Secondary | ICD-10-CM

## 2017-10-02 DIAGNOSIS — R9431 Abnormal electrocardiogram [ECG] [EKG]: Secondary | ICD-10-CM | POA: Insufficient documentation

## 2017-10-02 DIAGNOSIS — Z87891 Personal history of nicotine dependence: Secondary | ICD-10-CM

## 2017-10-02 DIAGNOSIS — M19012 Primary osteoarthritis, left shoulder: Secondary | ICD-10-CM | POA: Diagnosis present

## 2017-10-02 DIAGNOSIS — Z9861 Coronary angioplasty status: Secondary | ICD-10-CM | POA: Diagnosis not present

## 2017-10-02 DIAGNOSIS — R001 Bradycardia, unspecified: Secondary | ICD-10-CM | POA: Diagnosis not present

## 2017-10-02 DIAGNOSIS — Z7982 Long term (current) use of aspirin: Secondary | ICD-10-CM

## 2017-10-02 DIAGNOSIS — Z8249 Family history of ischemic heart disease and other diseases of the circulatory system: Secondary | ICD-10-CM

## 2017-10-02 DIAGNOSIS — N400 Enlarged prostate without lower urinary tract symptoms: Secondary | ICD-10-CM

## 2017-10-02 DIAGNOSIS — E782 Mixed hyperlipidemia: Secondary | ICD-10-CM | POA: Diagnosis present

## 2017-10-02 DIAGNOSIS — I480 Paroxysmal atrial fibrillation: Secondary | ICD-10-CM | POA: Diagnosis not present

## 2017-10-02 DIAGNOSIS — R918 Other nonspecific abnormal finding of lung field: Secondary | ICD-10-CM | POA: Insufficient documentation

## 2017-10-02 DIAGNOSIS — D6959 Other secondary thrombocytopenia: Secondary | ICD-10-CM | POA: Diagnosis not present

## 2017-10-02 DIAGNOSIS — Z01810 Encounter for preprocedural cardiovascular examination: Secondary | ICD-10-CM

## 2017-10-02 DIAGNOSIS — I700 Atherosclerosis of aorta: Secondary | ICD-10-CM | POA: Insufficient documentation

## 2017-10-02 DIAGNOSIS — Z01818 Encounter for other preprocedural examination: Secondary | ICD-10-CM | POA: Insufficient documentation

## 2017-10-02 DIAGNOSIS — I2121 ST elevation (STEMI) myocardial infarction involving left circumflex coronary artery: Secondary | ICD-10-CM | POA: Diagnosis not present

## 2017-10-02 DIAGNOSIS — E119 Type 2 diabetes mellitus without complications: Secondary | ICD-10-CM

## 2017-10-02 DIAGNOSIS — I318 Other specified diseases of pericardium: Secondary | ICD-10-CM

## 2017-10-02 DIAGNOSIS — Z91041 Radiographic dye allergy status: Secondary | ICD-10-CM | POA: Diagnosis not present

## 2017-10-02 DIAGNOSIS — Z01812 Encounter for preprocedural laboratory examination: Secondary | ICD-10-CM | POA: Insufficient documentation

## 2017-10-02 DIAGNOSIS — I25110 Atherosclerotic heart disease of native coronary artery with unstable angina pectoris: Secondary | ICD-10-CM | POA: Diagnosis present

## 2017-10-02 DIAGNOSIS — I2119 ST elevation (STEMI) myocardial infarction involving other coronary artery of inferior wall: Secondary | ICD-10-CM | POA: Diagnosis not present

## 2017-10-02 DIAGNOSIS — D509 Iron deficiency anemia, unspecified: Secondary | ICD-10-CM

## 2017-10-02 DIAGNOSIS — I350 Nonrheumatic aortic (valve) stenosis: Secondary | ICD-10-CM

## 2017-10-02 DIAGNOSIS — Z952 Presence of prosthetic heart valve: Secondary | ICD-10-CM

## 2017-10-02 DIAGNOSIS — R0789 Other chest pain: Secondary | ICD-10-CM | POA: Diagnosis present

## 2017-10-02 DIAGNOSIS — Z880 Allergy status to penicillin: Secondary | ICD-10-CM | POA: Diagnosis not present

## 2017-10-02 DIAGNOSIS — E1165 Type 2 diabetes mellitus with hyperglycemia: Secondary | ICD-10-CM | POA: Diagnosis present

## 2017-10-02 DIAGNOSIS — Z006 Encounter for examination for normal comparison and control in clinical research program: Secondary | ICD-10-CM

## 2017-10-02 DIAGNOSIS — J9811 Atelectasis: Secondary | ICD-10-CM

## 2017-10-02 DIAGNOSIS — I219 Acute myocardial infarction, unspecified: Secondary | ICD-10-CM

## 2017-10-02 DIAGNOSIS — Z951 Presence of aortocoronary bypass graft: Secondary | ICD-10-CM

## 2017-10-02 HISTORY — DX: Acute myocardial infarction, unspecified: I21.9

## 2017-10-02 HISTORY — PX: LEFT HEART CATH AND CORONARY ANGIOGRAPHY: CATH118249

## 2017-10-02 HISTORY — DX: Presence of xenogenic heart valve: Z95.3

## 2017-10-02 HISTORY — DX: Presence of aortocoronary bypass graft: Z95.1

## 2017-10-02 HISTORY — PX: CORONARY/GRAFT ACUTE MI REVASCULARIZATION: CATH118305

## 2017-10-02 HISTORY — DX: Solitary pulmonary nodule: R91.1

## 2017-10-02 LAB — PULMONARY FUNCTION TEST
DL/VA % pred: 78 %
DL/VA: 3.62 ml/min/mmHg/L
DLCO unc % pred: 68 %
DLCO unc: 22.97 ml/min/mmHg
FEF 25-75 Post: 1.48 L/sec
FEF 25-75 Pre: 1.02 L/sec
FEF2575-%Change-Post: 45 %
FEF2575-%Pred-Post: 68 %
FEF2575-%Pred-Pre: 47 %
FEV1-%Change-Post: 7 %
FEV1-%Pred-Post: 88 %
FEV1-%Pred-Pre: 82 %
FEV1-Post: 2.71 L
FEV1-Pre: 2.53 L
FEV1FVC-%Change-Post: 4 %
FEV1FVC-%Pred-Pre: 87 %
FEV6-%Change-Post: 5 %
FEV6-%Pred-Post: 97 %
FEV6-%Pred-Pre: 92 %
FEV6-Post: 3.88 L
FEV6-Pre: 3.67 L
FEV6FVC-%Change-Post: 3 %
FEV6FVC-%Pred-Post: 100 %
FEV6FVC-%Pred-Pre: 97 %
FVC-%Change-Post: 2 %
FVC-%Pred-Post: 96 %
FVC-%Pred-Pre: 94 %
FVC-Post: 4.11 L
FVC-Pre: 4.02 L
Post FEV1/FVC ratio: 66 %
Post FEV6/FVC ratio: 94 %
Pre FEV1/FVC ratio: 63 %
Pre FEV6/FVC Ratio: 91 %
RV % pred: 142 %
RV: 3.82 L
TLC % pred: 109 %
TLC: 7.93 L

## 2017-10-02 LAB — URINALYSIS, ROUTINE W REFLEX MICROSCOPIC
Bacteria, UA: NONE SEEN
Bilirubin Urine: NEGATIVE
Hgb urine dipstick: NEGATIVE
KETONES UR: 5 mg/dL — AB
LEUKOCYTES UA: NEGATIVE
NITRITE: NEGATIVE
PH: 5 (ref 5.0–8.0)
Protein, ur: NEGATIVE mg/dL

## 2017-10-02 LAB — COMPREHENSIVE METABOLIC PANEL
ALT: 40 U/L (ref 0–44)
AST: 44 U/L — ABNORMAL HIGH (ref 15–41)
Albumin: 4 g/dL (ref 3.5–5.0)
Alkaline Phosphatase: 43 U/L (ref 38–126)
Anion gap: 18 — ABNORMAL HIGH (ref 5–15)
BUN: 19 mg/dL (ref 8–23)
CO2: 14 mmol/L — ABNORMAL LOW (ref 22–32)
Calcium: 9.4 mg/dL (ref 8.9–10.3)
Chloride: 103 mmol/L (ref 98–111)
Creatinine, Ser: 0.82 mg/dL (ref 0.61–1.24)
GFR calc Af Amer: >60 mL/min (ref >60)
GFR calc non Af Amer: >60 mL/min (ref >60)
Glucose, Bld: 345 mg/dL — ABNORMAL HIGH (ref 70–99)
Potassium: 4 mmol/L (ref 3.5–5.1)
Sodium: 135 mmol/L (ref 135–145)
Total Bilirubin: 0.4 mg/dL (ref 0.3–1.2)
Total Protein: 7.3 g/dL (ref 6.5–8.1)

## 2017-10-02 LAB — CBC
HCT: 45 % (ref 39.0–52.0)
Hemoglobin: 14.9 g/dL (ref 13.0–17.0)
MCH: 31.6 pg (ref 26.0–34.0)
MCHC: 33.1 g/dL (ref 30.0–36.0)
MCV: 95.5 fL (ref 78.0–100.0)
Platelets: 146 10*3/uL — ABNORMAL LOW (ref 150–400)
RBC: 4.71 MIL/uL (ref 4.22–5.81)
RDW: 13.6 % (ref 11.5–15.5)
WBC: 16.1 10*3/uL — ABNORMAL HIGH (ref 4.0–10.5)

## 2017-10-02 LAB — BLOOD GAS, ARTERIAL
ACID-BASE DEFICIT: 4 mmol/L — AB (ref 0.0–2.0)
Bicarbonate: 19.9 mmol/L — ABNORMAL LOW (ref 20.0–28.0)
Drawn by: 4705911
O2 SAT: 97.5 %
PATIENT TEMPERATURE: 98.6
PCO2 ART: 32.5 mmHg (ref 32.0–48.0)
PO2 ART: 98.8 mmHg (ref 83.0–108.0)
pH, Arterial: 7.405 (ref 7.350–7.450)

## 2017-10-02 LAB — SURGICAL PCR SCREEN
MRSA, PCR: NEGATIVE
STAPHYLOCOCCUS AUREUS: NEGATIVE

## 2017-10-02 LAB — APTT: aPTT: 23 seconds — ABNORMAL LOW (ref 24–36)

## 2017-10-02 LAB — PROTIME-INR
INR: 1.17
Prothrombin Time: 14.8 seconds (ref 11.4–15.2)

## 2017-10-02 LAB — ABO/RH: ABO/RH(D): B NEG

## 2017-10-02 LAB — GLUCOSE, CAPILLARY: Glucose-Capillary: 399 mg/dL — ABNORMAL HIGH (ref 70–99)

## 2017-10-02 SURGERY — CORONARY/GRAFT ACUTE MI REVASCULARIZATION
Anesthesia: LOCAL

## 2017-10-02 MED ORDER — METOPROLOL TARTRATE 12.5 MG HALF TABLET
12.5000 mg | ORAL_TABLET | Freq: Two times a day (BID) | ORAL | Status: DC
  Administered 2017-10-03: 12.5 mg via ORAL
  Filled 2017-10-02: qty 1

## 2017-10-02 MED ORDER — METOPROLOL TARTRATE 5 MG/5ML IV SOLN
5.0000 mg | INTRAVENOUS | Status: DC | PRN
  Administered 2017-10-02: 5 mg via INTRAVENOUS
  Filled 2017-10-02: qty 5

## 2017-10-02 MED ORDER — ATORVASTATIN CALCIUM 80 MG PO TABS: 80.0000 mg | ORAL_TABLET | Freq: Every day | ORAL | Status: DC

## 2017-10-02 MED ORDER — LIDOCAINE HCL (PF) 1 % IJ SOLN
INTRAMUSCULAR | Status: AC
  Filled 2017-10-02: qty 30

## 2017-10-02 MED ORDER — DIPHENHYDRAMINE HCL 50 MG/ML IJ SOLN
INTRAMUSCULAR | Status: DC | PRN
  Administered 2017-10-02: 25 mg via INTRAVENOUS

## 2017-10-02 MED ORDER — HEPARIN (PORCINE) IN NACL 1000-0.9 UT/500ML-% IV SOLN
INTRAVENOUS | Status: AC
  Filled 2017-10-02: qty 1000

## 2017-10-02 MED ORDER — MIDAZOLAM HCL 2 MG/2ML IJ SOLN
INTRAMUSCULAR | Status: DC | PRN
  Administered 2017-10-02 (×2): 1 mg via INTRAVENOUS

## 2017-10-02 MED ORDER — IOHEXOL 350 MG/ML SOLN
INTRAVENOUS | Status: DC | PRN
  Administered 2017-10-02: 175 mL via INTRA_ARTERIAL

## 2017-10-02 MED ORDER — SODIUM CHLORIDE 0.9% FLUSH
3.0000 mL | Freq: Two times a day (BID) | INTRAVENOUS | Status: DC
  Administered 2017-10-03: 3 mL via INTRAVENOUS

## 2017-10-02 MED ORDER — LIDOCAINE HCL (PF) 1 % IJ SOLN
INTRAMUSCULAR | Status: DC | PRN
  Administered 2017-10-02: 2 mL

## 2017-10-02 MED ORDER — SODIUM CHLORIDE 0.9% FLUSH: 3.0000 mL | INTRAVENOUS | Status: DC | PRN

## 2017-10-02 MED ORDER — LABETALOL HCL 5 MG/ML IV SOLN: 10.0000 mg | INTRAVENOUS | Status: DC | PRN

## 2017-10-02 MED ORDER — SODIUM CHLORIDE 0.9 % IV SOLN
INTRAVENOUS | Status: DC
  Administered 2017-10-02: via INTRAVENOUS

## 2017-10-02 MED ORDER — METOPROLOL TARTRATE 5 MG/5ML IV SOLN
INTRAVENOUS | Status: AC
  Filled 2017-10-02: qty 5

## 2017-10-02 MED ORDER — HYDRALAZINE HCL 20 MG/ML IJ SOLN: 5.0000 mg | INTRAMUSCULAR | Status: DC | PRN

## 2017-10-02 MED ORDER — METHYLPREDNISOLONE SODIUM SUCC 125 MG IJ SOLR
INTRAMUSCULAR | Status: AC
  Filled 2017-10-02: qty 2

## 2017-10-02 MED ORDER — GLIPIZIDE ER 5 MG PO TB24
5.0000 mg | ORAL_TABLET | Freq: Two times a day (BID) | ORAL | Status: DC
  Filled 2017-10-02: qty 1

## 2017-10-02 MED ORDER — NITROGLYCERIN 0.4 MG SL SUBL: 0.4000 mg | SUBLINGUAL_TABLET | SUBLINGUAL | Status: DC | PRN

## 2017-10-02 MED ORDER — MIDAZOLAM HCL 2 MG/2ML IJ SOLN
INTRAMUSCULAR | Status: AC
  Filled 2017-10-02: qty 2

## 2017-10-02 MED ORDER — NITROGLYCERIN 1 MG/10 ML FOR IR/CATH LAB
INTRA_ARTERIAL | Status: DC | PRN
  Administered 2017-10-02: 200 ug via INTRA_ARTERIAL
  Administered 2017-10-02: 200 ug via INTRACORONARY

## 2017-10-02 MED ORDER — IOPAMIDOL (ISOVUE-370) INJECTION 76%
100.0000 mL | Freq: Once | INTRAVENOUS | Status: AC | PRN
  Administered 2017-10-02: 100 mL via INTRAVENOUS

## 2017-10-02 MED ORDER — ALBUTEROL SULFATE (2.5 MG/3ML) 0.083% IN NEBU
2.5000 mg | INHALATION_SOLUTION | Freq: Once | RESPIRATORY_TRACT | Status: AC
  Administered 2017-10-02: 2.5 mg via RESPIRATORY_TRACT

## 2017-10-02 MED ORDER — ZOLPIDEM TARTRATE 5 MG PO TABS: 5.0000 mg | ORAL_TABLET | Freq: Every evening | ORAL | Status: DC | PRN

## 2017-10-02 MED ORDER — GABAPENTIN 300 MG PO CAPS
600.0000 mg | ORAL_CAPSULE | Freq: Every day | ORAL | Status: DC
  Administered 2017-10-03: 600 mg via ORAL
  Filled 2017-10-02: qty 2

## 2017-10-02 MED ORDER — METHYLPREDNISOLONE SODIUM SUCC 125 MG IJ SOLR
INTRAMUSCULAR | Status: DC | PRN
  Administered 2017-10-02: 60 mg via INTRAVENOUS

## 2017-10-02 MED ORDER — TIROFIBAN HCL IN NACL 5-0.9 MG/100ML-% IV SOLN
0.1500 ug/kg/min | INTRAVENOUS | Status: DC
  Administered 2017-10-02 – 2017-10-03 (×3): 0.15 ug/kg/min via INTRAVENOUS
  Filled 2017-10-02 (×3): qty 100

## 2017-10-02 MED ORDER — SODIUM CHLORIDE 0.9 % IV SOLN
250.0000 mL | INTRAVENOUS | Status: DC | PRN
  Administered 2017-10-03: 13:00:00 via INTRAVENOUS

## 2017-10-02 MED ORDER — ASPIRIN EC 81 MG PO TBEC: 81.0000 mg | DELAYED_RELEASE_TABLET | Freq: Every day | ORAL | Status: DC

## 2017-10-02 MED ORDER — VERAPAMIL HCL 2.5 MG/ML IV SOLN
INTRAVENOUS | Status: DC | PRN
  Administered 2017-10-02: 10 mL via INTRA_ARTERIAL

## 2017-10-02 MED ORDER — HYDROCODONE-ACETAMINOPHEN 10-325 MG PO TABS
1.0000 | ORAL_TABLET | Freq: Two times a day (BID) | ORAL | Status: DC
  Administered 2017-10-03: 1 via ORAL
  Filled 2017-10-02: qty 1

## 2017-10-02 MED ORDER — NITROGLYCERIN 1 MG/10 ML FOR IR/CATH LAB
INTRA_ARTERIAL | Status: AC
  Filled 2017-10-02: qty 10

## 2017-10-02 MED ORDER — DIPHENHYDRAMINE HCL 50 MG/ML IJ SOLN
INTRAMUSCULAR | Status: AC
  Filled 2017-10-02: qty 1

## 2017-10-02 MED ORDER — SODIUM CHLORIDE 0.9 % IV SOLN
INTRAVENOUS | Status: AC | PRN
  Administered 2017-10-02: 100 mL/h via INTRAVENOUS

## 2017-10-02 MED ORDER — FENTANYL CITRATE (PF) 100 MCG/2ML IJ SOLN
INTRAMUSCULAR | Status: AC
  Filled 2017-10-02: qty 2

## 2017-10-02 MED ORDER — TIROFIBAN HCL IN NACL 5-0.9 MG/100ML-% IV SOLN
INTRAVENOUS | Status: AC
  Filled 2017-10-02: qty 100

## 2017-10-02 MED ORDER — VERAPAMIL HCL 2.5 MG/ML IV SOLN
INTRAVENOUS | Status: AC
  Filled 2017-10-02: qty 2

## 2017-10-02 MED ORDER — HEPARIN (PORCINE) IN NACL 1000-0.9 UT/500ML-% IV SOLN
INTRAVENOUS | Status: DC | PRN
  Administered 2017-10-02 (×2): 500 mL

## 2017-10-02 MED ORDER — ASPIRIN 81 MG PO CHEW: 81.0000 mg | CHEWABLE_TABLET | Freq: Every day | ORAL | Status: DC

## 2017-10-02 MED ORDER — ACETAMINOPHEN 325 MG PO TABS: 650.0000 mg | ORAL_TABLET | ORAL | Status: DC | PRN

## 2017-10-02 MED ORDER — METOPROLOL TARTRATE 5 MG/5ML IV SOLN
INTRAVENOUS | Status: AC
  Administered 2017-10-02: 5 mg via INTRAVENOUS
  Filled 2017-10-02: qty 5

## 2017-10-02 MED ORDER — IOPAMIDOL (ISOVUE-370) INJECTION 76%
INTRAVENOUS | Status: AC
  Filled 2017-10-02: qty 50

## 2017-10-02 MED ORDER — HEPARIN SODIUM (PORCINE) 1000 UNIT/ML IJ SOLN
INTRAMUSCULAR | Status: AC
  Filled 2017-10-02: qty 1

## 2017-10-02 MED ORDER — PANTOPRAZOLE SODIUM 40 MG PO TBEC
40.0000 mg | DELAYED_RELEASE_TABLET | Freq: Every day | ORAL | Status: DC
  Administered 2017-10-03: 40 mg via ORAL
  Filled 2017-10-02: qty 1

## 2017-10-02 MED ORDER — TIROFIBAN (AGGRASTAT) BOLUS VIA INFUSION
INTRAVENOUS | Status: DC | PRN
  Administered 2017-10-02: 2295 ug via INTRAVENOUS

## 2017-10-02 MED ORDER — HEPARIN SODIUM (PORCINE) 1000 UNIT/ML IJ SOLN
INTRAMUSCULAR | Status: DC | PRN
  Administered 2017-10-02: 3000 [IU] via INTRAVENOUS
  Administered 2017-10-02: 5000 [IU] via INTRAVENOUS
  Administered 2017-10-02: 6000 [IU] via INTRAVENOUS

## 2017-10-02 MED ORDER — ONDANSETRON HCL 4 MG/2ML IJ SOLN: 4.0000 mg | Freq: Four times a day (QID) | INTRAMUSCULAR | Status: DC | PRN

## 2017-10-02 MED ORDER — TIROFIBAN HCL IN NACL 5-0.9 MG/100ML-% IV SOLN
INTRAVENOUS | Status: AC | PRN
  Administered 2017-10-02: 0.15 ug/kg/min via INTRAVENOUS

## 2017-10-02 MED ORDER — FENTANYL CITRATE (PF) 100 MCG/2ML IJ SOLN
INTRAMUSCULAR | Status: DC | PRN
  Administered 2017-10-02 (×2): 25 ug via INTRAVENOUS

## 2017-10-02 SURGICAL SUPPLY — 22 items
BALLN SAPPHIRE 1.0X10 (BALLOONS) ×2
BALLN SAPPHIRE 2.0X12 (BALLOONS) ×2
BALLOON SAPPHIRE 1.0X10 (BALLOONS) ×1 IMPLANT
BALLOON SAPPHIRE 2.0X12 (BALLOONS) ×1 IMPLANT
CATH INFINITI 5FR MULTPACK ANG (CATHETERS) ×2 IMPLANT
CATH LAUNCHER 6FR EBU 3 (CATHETERS) ×2 IMPLANT
CATH LAUNCHER 6FR EBU3.5 (CATHETERS) ×2 IMPLANT
CATH OPTITORQUE TIG 4.0 5F (CATHETERS) IMPLANT
DEVICE RAD COMP TR BAND LRG (VASCULAR PRODUCTS) ×2 IMPLANT
GLIDESHEATH SLEND A-KIT 6F 20G (SHEATH) ×2 IMPLANT
GLIDESHEATH SLEND SS 6F .021 (SHEATH) IMPLANT
GUIDEWIRE INQWIRE 1.5J.035X260 (WIRE) ×1 IMPLANT
INQWIRE 1.5J .035X260CM (WIRE) ×2
KIT ENCORE 26 ADVANTAGE (KITS) ×2 IMPLANT
KIT HEART LEFT (KITS) ×2 IMPLANT
KIT HEMO VALVE WATCHDOG (MISCELLANEOUS) ×2 IMPLANT
PACK CARDIAC CATHETERIZATION (CUSTOM PROCEDURE TRAY) ×2 IMPLANT
SYR MEDRAD MARK V 150ML (SYRINGE) ×2 IMPLANT
TRANSDUCER W/STOPCOCK (MISCELLANEOUS) ×2 IMPLANT
TUBING CIL FLEX 10 FLL-RA (TUBING) ×2 IMPLANT
WIRE ASAHI PROWATER 180CM (WIRE) ×2 IMPLANT
WIRE FIGHTER CROSSING 190CM (WIRE) ×2 IMPLANT

## 2017-10-02 NOTE — Progress Notes (Signed)
Pre-CABG testing has been completed. 1-39% ICA stenosis bilaterally.  ABI's Right 1.1 Left 1.07  Palmar waveforms Right Palmar waveforms are obliterated with radial compression and remain within normal limits with ulnar compression. Left Palmar waveforms are obliterated with radial and ulnar compression.  10/02/17 9:59 AM Erik Keith RVT

## 2017-10-02 NOTE — Progress Notes (Signed)
Patient tolerated CT without incident. Drank coffee and did not want anything to eat. Ambulated steady gait to exit.

## 2017-10-02 NOTE — H&P (Signed)
Cardiology History & Physical    Patient ID: [address] MRN: [id number], DOB: 20-Aug-1940 Date of Encounter: 10/02/2017, 10:39 PM Primary Physician: Raina Mina., MD  Chief Complaint: chest pain   HPI: Erik Keith is a 77 y.o. male with history of CAD (PCI 2008, 2009), severe left ear, hypertension, hyperlipidemia, type 2 diabetes presents to the emergency room with chest pain concerning for STEMI.  Patient was seen earlier this morning in preoperative clinic in preparation for a CABG AVR operation next week.  This afternoon his son was found dead on the living room couch by his wife.  His son had been in normal health this morning, so this was a surprise to the patient.  EMS was called for the patient's son; however, when they pronounced him DOA, the patient developed substernal chest pain that he rated a 10 out of 10.  EMS did an ECG which demonstrated inferior ST elevations and promptly took him to the ER for further evaluation.  In the ER, patient reported that his chest pain had somewhat improved and was now 3 out of 10.  An ECG was repeated and his ST segment elevations had largely resolved; however, he continued to have Q waves in II, III, and aVF.  He was taken emergently to the Cath Lab and angiography demonstrated that his previous 99% of his left circumflex OM 2 and OM 3 (previously TIMI II flow) were now near total occlusions with TIMI I flow.  Given the change in his angiogram as well as his ongoing chest pain the decision was made to try to balloon his circumflex artery.  Dr. Irish Lack contacted the patient's surgeon who happened to be on-call tonight (Dr. Roxy Manns) to keep appraised of the plan.  Past Medical History:  Diagnosis Date  . Aortic valve stenosis, severe   . CAD S/P percutaneous coronary angioplasty    12-2006  STENT TO LAD;  07-2007 DEStenting TO RCA   . GERD (gastroesophageal reflux disease)   . Hypertension   . Incidental pulmonary nodule, less than or  equal to 64mm 10/02/2017   Ground glass opacities RUL seen on CTA  . Iron deficiency anemia   . Mixed hyperlipidemia   . OA (osteoarthritis) of shoulder    LEFT SHOULDER AND AC JOINT  . Type 2 diabetes mellitus [facility])      Surgical History:  Past Surgical History:  Procedure Laterality Date  . APPENDECTOMY    . BACK SURGERY  x3  . RIGHT/LEFT HEART CATH AND CORONARY ANGIOGRAPHY N/A 09/12/2017   Procedure: RIGHT/LEFT HEART CATH AND CORONARY ANGIOGRAPHY;  Surgeon: Burnell Blanks, MD;  Location: [place] [facility];  Service: Cardiovascular;  Laterality: N/A;  . TONSILLECTOMY       Home Meds: Prior to Admission medications   Medication Sig Start Date End Date Taking? Authorizing Provider  aspirin EC 81 MG tablet Take 81 mg by mouth daily.    [provider]  b complex vitamins tablet Take 1 tablet by mouth 4 (four) times a week.     [provider]  ferrous sulfate 324 (65 Fe) MG TBEC Take 325 mg by mouth daily.     [provider]  gabapentin (NEURONTIN) 300 MG capsule Take 600 mg by mouth at bedtime.     [provider]  glipiZIDE (GLUCOTROL XL) 5 MG 24 hr tablet Take 5 mg by mouth 2 (two) times daily.    [provider]  Glucos-Chond-Hyal Ac-Ca Fructo (  MOVE FREE JOINT HEALTH ADVANCE PO) Take 1 tablet by mouth 3 (three) times a week.     [provider]  HYDROcodone-acetaminophen (NORCO) 10-325 MG tablet Take 1 tablet by mouth 2 (two) times daily.     [provider]  metFORMIN (GLUCOPHAGE) 500 MG tablet Take 500 mg by mouth 2 (two) times daily.     [provider]  naproxen sodium (ALEVE) 220 MG tablet Take 220-440 mg by mouth 2 (two) times daily as needed (FOR PAIN.).    [provider]  nitroGLYCERIN (NITROSTAT) 0.4 MG SL tablet Place 0.4 mg under the tongue every 5 (five) minutes as needed for chest pain.  07/12/17   [provider]  pantoprazole (PROTONIX) 40 MG tablet Take 40 mg by mouth  at bedtime.     [provider]  Polyethylene Glycol 3350 (MIRALAX PO) Take 17 g by mouth daily. WITH COFFEE    [provider]  predniSONE (DELTASONE) 50 MG tablet Take Prednisone 50 mg 13 hours prior to CT Scan, then Take Prednisone 50 mg 7 hours prior to CT Scan then Take Prednisone 50 mg and Benadryl 50 mg 1 hour prior to CT scan 09/20/17   Rexene Alberts, MD  ramipril (ALTACE) 5 MG capsule Take 5 mg by mouth 2 (two) times daily.    [provider]  simvastatin (ZOCOR) 40 MG tablet Take 40 mg by mouth every evening.     [provider]  zolpidem (AMBIEN) 10 MG tablet Take 10 mg by mouth at bedtime as needed for sleep.    [provider]    Allergies:  Allergies  Allergen Reactions  . Contrast Media [Iodinated Diagnostic Agents] Hives  . Penicillins Hives    Has patient had a PCN reaction causing immediate rash, facial/tongue/throat swelling, SOB or lightheadedness with hypotension: unkn Has patient had a PCN reaction causing severe rash involving mucus membranes or skin necrosis: unkn Has patient had a PCN reaction that required hospitalization: unkn Has patient had a PCN reaction occurring within the last 10 years: no If all of the above answers are "NO", then [place] proceed with Cephalosporin use.     Social History   Socioeconomic History  . Marital status: Married    Spouse name: Lenell Antu  . Number of children: 2  . Years of education: Not on file  . Highest education level: Not on file  Occupational History  . Occupation: Retired-worked at [employer]  . Financial resource strain: Not on file  . Food insecurity:    Worry: Not on file    Inability: Not on file  . Transportation needs:    Medical: Not on file    Non-medical: Not on file  Tobacco Use  . Smoking status: Former Smoker    Years: 40.00    Last attempt to quit: 2002    Years since quitting: 17.7  . Smokeless tobacco: Never Used  Substance and  Sexual Activity  . Alcohol use: Never    Frequency: Never  . Drug use: Never  . Sexual activity: Not on file  Lifestyle  . Physical activity:    Days per week: Not on file    Minutes per session: Not on file  . Stress: Not on file  Relationships  . Social connections:    Talks on phone: Not on file    Gets together: Not on file    Attends religious service: Not on file    Active member  of club or organization: Not on file    Attends meetings of clubs or organizations: Not on file    Relationship status: Not on file  . Intimate partner violence:    Fear of current or ex partner: Not on file    Emotionally abused: Not on file    Physically abused: Not on file    Forced sexual activity: Not on file  Other Topics Concern  . Not on file  Social History Narrative  . Not on file     Family History  Problem Relation Age of Onset  . CVA Father   . Heart attack Sister   . Heart attack Brother        3 brothers with CAD    Review of Systems: All other systems reviewed and are otherwise negative except as noted above. Patient endorsed substernal chest pressure that he rated 4 out of 10, denied shortness of breath Remainder of review of systems deferred due to acuity of patient illness  Labs:   Lab Results  Component Value Date   WBC 16.1 (H) 10/02/2017   HGB 14.9 10/02/2017   HCT 45.0 10/02/2017   MCV 95.5 10/02/2017   PLT 146 (L) 10/02/2017    Recent Labs  Lab 10/02/17 1422  NA 135  K 4.0  CL 103  CO2 14*  BUN 19  CREATININE 0.82  CALCIUM 9.4  PROT 7.3  BILITOT 0.4  ALKPHOS 43  ALT 40  AST 44*  GLUCOSE 345*   No results for input(s): CKTOTAL, CKMB, TROPONINI in the last 72 hours. No results found for: CHOL, HDL, LDLCALC, TRIG No results found for: DDIMER  Radiology/Studies:  Dg Chest 2 View  Result Date: 10/02/2017 CLINICAL DATA:  Pre-op for open heart and new valve surgery - hx of diabetes, htn, anemia, GERD, exsmoker EXAM: CHEST - 2 VIEW COMPARISON:   Chest x-ray dated 01/30/2017. FINDINGS: The heart size and mediastinal contours are within normal limits. Both lungs are clear. No acute or suspicious osseous finding. IMPRESSION: No active cardiopulmonary disease. Electronically Signed   By: Franki Cabot M.D.   On: 10/02/2017 20:11   Ct Coronary Morph W/cta Cor W/score W/ca W/cm &/or Wo/cm  Addendum Date: 10/02/2017   ADDENDUM REPORT: 10/02/2017 14:45 CLINICAL DATA:  Aortic stenosis SAVR EXAM: Cardiac TAVR CT TECHNIQUE: The patient was scanned on a [employer]. A 120 kV retrospective scan was triggered in the descending thoracic aorta at 111 HU's. Gantry rotation speed was 270 msecs and collimation was .9 mm. No beta blockade or nitro were given. The 3D data set was reconstructed in 5% intervals of the R-R cycle. Systolic and diastolic phases were analyzed on a dedicated work station using MPR, MIP and VRT modes. The patient received 80 cc of contrast. Initial scan had motion artifact in the aortic root and annulus The patient returned for repeat scanning with 2nd scan having poor opacification but no motion FINDINGS: Aortic Valve: Tri leaflet and heavily calcified. Right coronary cups moves with fusion of non and left cusps Aorta: No aneurysm moderate mixed plaque through out. Normal arch vessels Sinotubular Junction: 27 mm Ascending Thoracic Aorta: 33 mm Aortic Arch: 26 mm Descending Thoracic Aorta: 26 mm Sinus of Valsalva Measurements: Non-coronary: 29.5 mm Right -coronary: 28 mm Left -coronary: 29 mm Coronary Artery Height above Annulus: Left Main: 11.5 mm above annulus Right Coronary: 13.5 mm above annulus Virtual Basal Annulus Measurements: Maximum/Minimum Diameter: 27.8 mm x 22.4 mm Area: 496 mm 2 IMPRESSION:  1. Normal aortic root 3.3 cm with moderate mixed atherosclerotic debris Normal arch vessels 2. Severely calcified tri leaflet AV with restricted leaflet motion Right cusp moves most [place] Electronically Signed   By: Jenkins Rouge  M.D.   On: 10/02/2017 14:45   Result Date: 10/02/2017 EXAM: OVER-READ INTERPRETATION  CT CHEST The following report is an over-read performed by radiologist Dr. Salvatore Marvel of [facility], [place] on 10/02/2017. This over-read does not include interpretation of cardiac or coronary anatomy or pathology. The coronary CTA interpretation by the cardiologist is attached. COMPARISON:  01/30/2017 chest radiograph.  11/27/2006 chest CT. FINDINGS: Please see the separate concurrent chest CT angiogram report for details. IMPRESSION: Please see the separate concurrent chest CT angiogram report for details. Electronically Signed: By: Ilona Sorrel M.D. On: 10/02/2017 14:29   Ct Angio Chest Aorta W &/or Wo Contrast  Result Date: 10/02/2017 CLINICAL DATA:  Severe symptomatic aortic stenosis. TAVR evaluation. EXAM: CT ANGIOGRAPHY CHEST, ABDOMEN AND PELVIS TECHNIQUE: Multidetector CT imaging through the chest, abdomen and pelvis was performed using the standard protocol during bolus administration of intravenous contrast. Multiplanar reconstructed images and MIPs were obtained and reviewed to evaluate the vascular anatomy. CONTRAST:  177mL ISOVUE-370 IOPAMIDOL (ISOVUE-370) INJECTION 76% COMPARISON:  11/27/2006 chest CT. FINDINGS: CTA CHEST FINDINGS Cardiovascular: Top-normal heart size. No significant pericardial effusion/thickening. Severe thickening and calcification of the aortic valve. Three-vessel coronary atherosclerosis. Atherosclerotic nonaneurysmal thoracic aorta. Normal caliber pulmonary arteries. No central pulmonary emboli. Mediastinum/Nodes: No discrete thyroid nodules. Unremarkable esophagus. No pathologically enlarged axillary, mediastinal or hilar lymph nodes. Simple 1.5 x 1.0 cm right anterior mediastinal cystic structure (series 6/image 39), new since 11/27/2006 chest CT. Lungs/Pleura: No pneumothorax. No pleural effusion. Mild-to-moderate centrilobular emphysema with mild diffuse bronchial wall thickening.  There are ground-glass pulmonary nodules in the right upper lobe measuring 1.8 cm (series 7/image 24) and 1.7 cm (series 7/image 40), not definitely seen on the 2008 chest CT. No acute consolidative airspace disease, lung masses or additional significant pulmonary nodules. Musculoskeletal: No aggressive appearing focal osseous lesions. Moderate thoracic spondylosis. CTA ABDOMEN AND PELVIS FINDINGS Hepatobiliary: Normal liver size. Scattered tiny granulomatous liver calcifications. No liver masses. Normal gallbladder with no radiopaque cholelithiasis. No biliary ductal dilatation. Pancreas: Normal, with no mass or duct dilation. Spleen: Normal size. No mass. Adrenals/Urinary Tract: Normal adrenals. Scattered subcentimeter hypodense renal cortical lesions in both kidneys, too small to characterize, which require no follow-up. No hydronephrosis. Normal bladder. Stomach/Bowel: Normal non-distended stomach. Normal caliber small bowel with no small bowel wall thickening. Appendectomy. Mild sigmoid diverticulosis. No large bowel wall thickening or significant pericolonic fat stranding. Moderate colonic stool volume. Vascular/Lymphatic: Atherosclerotic nonaneurysmal abdominal aorta. Patent portal, splenic and renal veins. No pathologically enlarged lymph nodes in the abdomen or pelvis. Reproductive: Mildly enlarged prostate. Other: No pneumoperitoneum, ascites or focal fluid collection. Musculoskeletal: No aggressive appearing focal osseous lesions. Moderate lumbar spondylosis. VASCULAR MEASUREMENTS PERTINENT TO TAVR: AORTA: Minimal Aortic Diameter-17.2 x 13.9 mm (infrarenal abdominal aorta on series 6/image 134) Severity of Aortic Calcification-moderate RIGHT PELVIS: Right Common Iliac Artery - Minimal Diameter-8.7 x 7.1 mm Tortuosity-mild Calcification-moderate Right External Iliac Artery - Minimal Diameter-6.5 x 6.2 mm Tortuosity-mild Calcification-mild Right Common Femoral Artery - Minimal Diameter-7.2 x 5.7 mm  Tortuosity-mild Calcification-moderate LEFT PELVIS: Left Common Iliac Artery - Minimal Diameter-9.3 x 7.4 mm Tortuosity-mild Calcification-moderate to severe Left External Iliac Artery - Minimal Diameter-7.1 x 5.0 mm Tortuosity-mild-to-moderate Calcification-mild Left Common Femoral Artery - Minimal Diameter-5.7 x 5.5 mm Tortuosity-mild Calcification-moderate Review of the  MIP images confirms the above findings. IMPRESSION: 1. Vascular findings and measurements pertinent to potential TAVR procedure, as detailed above. 2. Severe thickening and calcification of the aortic valve, compatible with the provided clinical history of severe symptomatic aortic stenosis. 3. Two right upper lobe ground-glass pulmonary nodules, largest 1.8 cm. Non-contrast chest CT at 3-6 months is recommended. If nodules persist, subsequent management will be based upon the most suspicious nodule(s). This recommendation follows the consensus statement: Guidelines for Management of Incidental Pulmonary Nodules Detected on CT Images:From the Fleischner Society 2017; published online before print (10.1148/radiol.[id number]). 4. Small simple 1.5 cm cystic structure in the right anterior mediastinum. Differential includes pericardial cyst, benign thymic cyst or cystic thymoma. This structure can also be reassessed on the follow-up chest CT in 3-6 months. 5. Three-vessel coronary atherosclerosis. 6. Aortic Atherosclerosis (ICD10-I70.0) and Emphysema (ICD10-J43.9). 7. Mildly enlarged prostate. 8. Mild sigmoid diverticulosis. Electronically Signed   By: Ilona Sorrel M.D.   On: 10/02/2017 14:28   Ct Angio Abd/pel W/ And/or W/o  Result Date: 10/02/2017 CLINICAL DATA:  Severe symptomatic aortic stenosis. TAVR evaluation. EXAM: CT ANGIOGRAPHY CHEST, ABDOMEN AND PELVIS TECHNIQUE: Multidetector CT imaging through the chest, abdomen and pelvis was performed using the standard protocol during bolus administration of intravenous contrast. Multiplanar  reconstructed images and MIPs were obtained and reviewed to evaluate the vascular anatomy. CONTRAST:  156mL ISOVUE-370 IOPAMIDOL (ISOVUE-370) INJECTION 76% COMPARISON:  11/27/2006 chest CT. FINDINGS: CTA CHEST FINDINGS Cardiovascular: Top-normal heart size. No significant pericardial effusion/thickening. Severe thickening and calcification of the aortic valve. Three-vessel coronary atherosclerosis. Atherosclerotic nonaneurysmal thoracic aorta. Normal caliber pulmonary arteries. No central pulmonary emboli. Mediastinum/Nodes: No discrete thyroid nodules. Unremarkable esophagus. No pathologically enlarged axillary, mediastinal or hilar lymph nodes. Simple 1.5 x 1.0 cm right anterior mediastinal cystic structure (series 6/image 39), new since 11/27/2006 chest CT. Lungs/Pleura: No pneumothorax. No pleural effusion. Mild-to-moderate centrilobular emphysema with mild diffuse bronchial wall thickening. There are ground-glass pulmonary nodules in the right upper lobe measuring 1.8 cm (series 7/image 24) and 1.7 cm (series 7/image 40), not definitely seen on the 2008 chest CT. No acute consolidative airspace disease, lung masses or additional significant pulmonary nodules. Musculoskeletal: No aggressive appearing focal osseous lesions. Moderate thoracic spondylosis. CTA ABDOMEN AND PELVIS FINDINGS Hepatobiliary: Normal liver size. Scattered tiny granulomatous liver calcifications. No liver masses. Normal gallbladder with no radiopaque cholelithiasis. No biliary ductal dilatation. Pancreas: Normal, with no mass or duct dilation. Spleen: Normal size. No mass. Adrenals/Urinary Tract: Normal adrenals. Scattered subcentimeter hypodense renal cortical lesions in both kidneys, too small to characterize, which require no follow-up. No hydronephrosis. Normal bladder. Stomach/Bowel: Normal non-distended stomach. Normal caliber small bowel with no small bowel wall thickening. Appendectomy. Mild sigmoid diverticulosis. No large bowel  wall thickening or significant pericolonic fat stranding. Moderate colonic stool volume. Vascular/Lymphatic: Atherosclerotic nonaneurysmal abdominal aorta. Patent portal, splenic and renal veins. No pathologically enlarged lymph nodes in the abdomen or pelvis. Reproductive: Mildly enlarged prostate. Other: No pneumoperitoneum, ascites or focal fluid collection. Musculoskeletal: No aggressive appearing focal osseous lesions. Moderate lumbar spondylosis. VASCULAR MEASUREMENTS PERTINENT TO TAVR: AORTA: Minimal Aortic Diameter-17.2 x 13.9 mm (infrarenal abdominal aorta on series 6/image 134) Severity of Aortic Calcification-moderate RIGHT PELVIS: Right Common Iliac Artery - Minimal Diameter-8.7 x 7.1 mm Tortuosity-mild Calcification-moderate Right External Iliac Artery - Minimal Diameter-6.5 x 6.2 mm Tortuosity-mild Calcification-mild Right Common Femoral Artery - Minimal Diameter-7.2 x 5.7 mm Tortuosity-mild Calcification-moderate LEFT PELVIS: Left Common Iliac Artery - Minimal Diameter-9.3 x 7.4 mm Tortuosity-mild Calcification-moderate to severe Left  External Iliac Artery - Minimal Diameter-7.1 x 5.0 mm Tortuosity-mild-to-moderate Calcification-mild Left Common Femoral Artery - Minimal Diameter-5.7 x 5.5 mm Tortuosity-mild Calcification-moderate Review of the MIP images confirms the above findings. IMPRESSION: 1. Vascular findings and measurements pertinent to potential TAVR procedure, as detailed above. 2. Severe thickening and calcification of the aortic valve, compatible with the provided clinical history of severe symptomatic aortic stenosis. 3. Two right upper lobe ground-glass pulmonary nodules, largest 1.8 cm. Non-contrast chest CT at 3-6 months is recommended. If nodules persist, subsequent management will be based upon the most suspicious nodule(s). This recommendation follows the consensus statement: Guidelines for Management of Incidental Pulmonary Nodules Detected on CT Images:From the Fleischner Society  2017; published online before print (10.1148/radiol.[id number]). 4. Small simple 1.5 cm cystic structure in the right anterior mediastinum. Differential includes pericardial cyst, benign thymic cyst or cystic thymoma. This structure can also be reassessed on the follow-up chest CT in 3-6 months. 5. Three-vessel coronary atherosclerosis. 6. Aortic Atherosclerosis (ICD10-I70.0) and Emphysema (ICD10-J43.9). 7. Mildly enlarged prostate. 8. Mild sigmoid diverticulosis. Electronically Signed   By: Ilona Sorrel M.D.   On: 10/02/2017 14:28   Wt Readings from Last 3 Encounters:  10/02/17 91.8 kg  09/20/17 91.3 kg  09/12/17 93 kg    EKG: Normal sinus rhythm, ST elevations and Q waves in II, III and aVF with a maximum amplitude of 1 mm, reciprocal ST depressions in the anterior anteroseptal leads. Repeat ECG with resolution of ST elevations with persistence of inferior Q waves  Physical Exam: SpO2 97 %. There is no height or weight on file to calculate BMI. General: Well developed, well nourished, in no acute distress. Head: Normocephalic, atraumatic, sclera non-icteric, no xanthomas, nares are without discharge.  Neck: Negative for carotid bruits. JVD not elevated. Lungs: Clear bilaterally to auscultation without wheezes, rales, or rhonchi. Breathing is unlabored. Heart: RRR with S1 S2. No murmurs, rubs, or gallops appreciated. Abdomen: Soft, non-tender, non-distended with normoactive bowel sounds. No hepatomegaly. No rebound/guarding. No obvious abdominal masses. Msk:  Strength and tone appear normal for age. Extremities: No clubbing or cyanosis. No edema.  Distal pedal pulses are 2+ and equal bilaterally. Neuro: Alert and oriented X 3. No focal deficit. No facial asymmetry. Moves all extremities spontaneously. Psych:  Responds to questions appropriately with a normal affect.   Prior TTE 09/05/17: LVEF: 60-65% AoV: mean gradient 28, peak velocity 3.43m/s, AVA 0.9cm2 Mild MR  Prior LHC  09/12/17: Conclusion     Mid RCA to Dist RCA lesion is 60% stenosed.  Mid RCA lesion is 70% stenosed.  RPDA lesion is 99% stenosed.  Prox Cx to Mid Cx lesion is 99% stenosed.  Ost Cx to Prox Cx lesion is 80% stenosed.  Ost LAD to Prox LAD lesion is 30% stenosed.  Mid LAD-1 lesion is 60% stenosed.  Mid LAD-2 lesion is 70% stenosed.  LV end diastolic pressure is normal.  1. Severe triple vessel CAD 2. Moderately severe stenosis in the mid LAD. Patent stent proximal LAD 3. Severe stenosis involving a heavily calcified segment of the proximal Circumflex. Subtotal occlusion of the mid segment of the large branching obtuse marginal. The stenosis involves both sub-branches of the obtuse marginal 4. The RCA is a large dominant vessel with a proximal aneurysmal segment followed by an old stent in the mid vessel. There is moderately severe disease in the mid vessel prior to the old stent and within the old stent.  5. Severe aortic stenosis (peak to peak gradient 40 mmHg,  mean gradient 33.6 mmHg, AVA 0.87 cm2). By echo the valve leaflets appear to be thickened and calcified and they do not open well. Dimensionless index below 0.25 and AVA 0.77cm2 by echo.   Recommendations: He has complex multivessel CAD and severe aortic valve stenosis. His CAD would be difficult to treat with PCI given the extensive disease in the Circumflex. His distal targets are patent. I think we should consider surgical AVR and CABG. I will arrange for him to be seen by Dr. Roxy Manns or Dr. Cyndia Bent to review options for surgery. He would be a good candidate for TAVR but given his extensive CAD, a percutaneous approach does not seem like the optimal approach.      Assessment and Plan   Erik Keith is a 77 y.o. male with history of CAD (PCI 2008, 2009), severe left ear, hypertension, hyperlipidemia, type 2 diabetes presents to the emergency room with chest pain concerning for STEMI.  #Acute STEMI: Patient presented  with acute chest pain in the setting of emotional distress noted to have ST elevations with her cervical changes concerning for acute transmural ischemia.  Coronary angiography demonstrated acute changes in blood flow to the left circumflex artery stent with acute coronary syndrome.  Given that the patient is planned for operative management of his three-vessel coronary disease and aortic valve disease, his acute coronary lesion will be treated with balloon angioplasty without stent placement.  We will continue him on IV antiplatelet agents post-cath.  We will reassess his EF in the morning and have cardiac surgery reevaluate him in the morning. - Balloon angioplasty left circumflex artery in process - Plan for IV antiplatelet post procedure - TTE in the morning - CT surgery to reevaluate in the morning  #Severe AS: Patient has moderate to severe aortic stenosis by echocardiographic criteria; however diagnostic cardiac catheterization confirmed the presence of severe left ear with a transvalvular gradient measured to be 33.6 corresponding to an AVA of 0.87 the patient had been evaluated by Dr. Roxy Manns on 9/20 who had planned for CABG + AVR later this week.  Given the acute MI if it is occurred in the interim, we will rediscuss timing of surgery with CT surgery team in the morning. -CT surgery to reevaluate in the morning  #HTN: Blood pressure reasonably controlled as an outpatient on ramipril.  Will hold ACE inhibitor given potential earlier surgical date -Hold ACE inhibitor  #HL: On simvastatin 40 as an outpatient we will upgrade to atorva 80 given acute MI -switch simva 40 to atorva 80.  #Non-insulin depdendent DM2, uncontrolled: A1C 7.9, managed on glipizide and metformin as an outpatient. - Sliding scale insulin while inpatient  Signed, Lujean Amel, MD 10/02/2017, 10:39 PM   I have examined the patient and reviewed assessment and plan and discussed with patient.  Agree with above as stated.   I personally reviewed the ECGs and made the decision for him to come to the cath lab.  Cath showed occluded circumflex.  Diffusely diseased vessel.  He is scheduled for CABG /AVR on 10/8.  THerefore, we ballooned the occluded circ and continued IV antiplatelet.  No oral antiplatelet given.  If surgery timing changed, Durante need oral antiplatelet.   Watch in ICU. Discussed with Dr. Roxy Manns as well.  Cardiac surgery will also see the patient in the AM.   Larae Grooms

## 2017-10-02 NOTE — Progress Notes (Addendum)
Patient arrived from CT with audible wheezing noted.  O2 sat is 97%  I called A. Zelenak, PA to have her come look at him. Accu check was 399 He states he never wheezed.  Also came up from CT with IV in right ACF. 1500  #20 angio removed from right ACF with tip intact.  Pressure dressing applied.  Patient states he feels better now after resting.  Very slight wheeze noted, maybe coming from sinuses. Does check blood sugars bid  Ranges from 105-200 PCP is Dr. Bea Graff of [place],  [place] 07/2017 Cardio is Dr. Julianne Handler  LOV 09/2017

## 2017-10-02 NOTE — Progress Notes (Addendum)
Anesthesia PAT Evaluation:   Case:  [id number] Date/Time:  10/08/17 0715   Procedures:      AORTIC VALVE REPLACEMENT (AVR) (N/A Chest)     CORONARY ARTERY BYPASS GRAFTING (CABG) (N/A Chest)     TRANSESOPHAGEAL ECHOCARDIOGRAM (TEE) (N/A )   Anesthesia type:  General   Pre-op diagnosis:      AS     CAD   Location:  [facility] OR ROOM 15 / [facility]   Surgeon:  Rexene Alberts, MD      DISCUSSION: Patient is a 77 year old male scheduled for the above procedures. Interestingly, he was scheduled to have left shoulder arthroscopy with possible rotator cuff repair on 08/27/17 at [facility], but case was cancelled when his anesthesiologist Myrtie Soman, MD noted a recent outside echocardiogram report that showed progression to severe AS that had not yet been addressed with patient. Shoulder surgery was cancelled, and he was referred to cardiologist Lauree Chandler, MD for further evaluation.  History includes former smoker (quit '02), severe AS, CAD (s/p stent LAD 12/2006, DES RCA 07/2007), HLD, HTN, GERD, DM2, iron deficiency anemia.   He has a contrast allergy (body turned "red") after his second cardiac cath. He received Prednisone 50 mg 13 hours, 7 hours, and 1 hour before CTA this morning and Benadryl 50 mg 1 hour before. He apparently had to come back for additional imaging just before his PAT visit. When he arrived to PAT, PAT RN thought patient had audible wheezing although he did not report SOB and could converse without difficultly. O2 sats on RA 97%, RR 20. By the time, I arrived I did not note any audible wheezing or swelling. Patient is very pleasant and talkative and no conversational dyspnea noted. No wheezing noted over his neck or lungs. Heart RRR, with III/VI SEM. He did report that he has exertional dyspnea and "tightness" that he feels is stable (this is documented his his cardiology note from Dr. Angelena Form). No symptoms at rest. He feels at his baseline.     Non-fasting CBG 399. He did not take his  DM medication today because of CTA. A1c is pending. Reported average glucose ~ 130. CXR, labs, carotid U/S result pending. Chart will be left for follow-up.   ADDENDUM 10/03/17 9:39 PM: Pending results reviewed. Noted that patient was admitted overnight. Unfortunately his son died unexpectedly at home on 10/23/2017. Patient developed chest pain with findings concerning for STEMI. He was taken emergently to the cath lab and underwent angioplasty of CX. Dr. Roxy Manns saw in consultation this morning. Patient to still proceed with planned surgery, although it is unclear if the date will be moved up. A follow-up echo has been ordered but has not been done yet.    VS: BP 124/77   Pulse 75   Temp 36.6 C   Resp 20   Wt 91.8 kg   SpO2 97%   BMI 28.23 kg/m     PROVIDERS: Raina Mina., MD is PCP Lauree Chandler, MD is current cardiologist. Prior he had seen Mathis Bud, MD with [facility] (last visit 05/16/17) and prior to that Shirlee More, MD (last 04/19/16) when he was with [facility].    LABS: WBC 16.1. He has taken Prednisone 150 mg total over the past 24 hours which could be contributing.   (all labs ordered are listed, but only abnormal results are displayed)  Labs Reviewed  GLUCOSE, CAPILLARY - Abnormal; Notable for the following components:  Result Value   Glucose-Capillary 399 (*)    All other components within normal limits  APTT - Abnormal; Notable for the following components:   aPTT 23 (*)    All other components within normal limits  BLOOD GAS, ARTERIAL - Abnormal; Notable for the following components:   Bicarbonate 19.9 (*)    Acid-base deficit 4.0 (*)    All other components within normal limits  CBC - Abnormal; Notable for the following components:   WBC 16.1 (*)    Platelets 146 (*)    All other components within normal limits  COMPREHENSIVE METABOLIC PANEL - Abnormal; Notable for the following components:   CO2 14 (*)     Glucose, Bld 345 (*)    AST 44 (*)    Anion gap 18 (*)    All other components within normal limits  HEMOGLOBIN A1C - Abnormal; Notable for the following components:   Hgb A1c MFr Bld 8.5 (*)    All other components within normal limits  URINALYSIS, ROUTINE W REFLEX MICROSCOPIC - Abnormal; Notable for the following components:   Color, Urine STRAW (*)    Specific Gravity, Urine >1.046 (*)    Glucose, UA >=500 (*)    Ketones, ur 5 (*)    All other components within normal limits  SURGICAL PCR SCREEN  PROTIME-INR  TYPE AND SCREEN  ABO/RH   PFTs 10/02/17: FVC 4.02 (94%), FEV1 2.53 (82%), DLCO unc 22.97 (68%).   IMAGES: CXR 10/02/17:  IMPRESSION: No active cardiopulmonary disease.  CTA chest/abd/pelvis 10/02/17 (ordered by Dr. Angelena Form; report also routed to Dr. Roxy Manns): IMPRESSION: 1. Vascular findings and measurements pertinent to potential TAVR procedure, as detailed above. 2. Severe thickening and calcification of the aortic valve, compatible with the provided clinical history of severe symptomatic aortic stenosis. 3. Two right upper lobe ground-glass pulmonary nodules, largest 1.8 cm. Non-contrast chest CT at 3-6 months is recommended. If nodules persist, subsequent management will be based upon the most suspicious nodule(s). This recommendation follows the consensus statement: Guidelines for Management of Incidental Pulmonary Nodules Detected on CT Images:From the Fleischner Society 2017; published online before print (10.1148/radiol.9485462703). 4. Small simple 1.5 cm cystic structure in the right anterior mediastinum. Differential includes pericardial cyst, benign thymic cyst or cystic thymoma. This structure can also be reassessed on the follow-up chest CT in 3-6 months. 5. Three-vessel coronary atherosclerosis. 6. Aortic Atherosclerosis (ICD10-I70.0) and Emphysema (ICD10-J43.9). 7. Mildly enlarged prostate. 8. Mild sigmoid diverticulosis.  EKG: 10/02/17: NSR,  non-specific T wave abnormality.   CV: Coronary CT 10/02/17: IMPRESSION: 1. Normal aortic root 3.3 cm with moderate mixed atherosclerotic debris Normal arch vessels 2. Severely calcified tri leaflet AV with restricted leaflet motion Right cusp moves most  Cardiac cath 10/02/17:  Ost LAD to Prox LAD lesion is 30% stenosed.  Mid LAD-1 lesion is 60% stenosed.  Mid LAD-2 lesion is 70% stenosed.  Prox Cx to Mid Cx lesion is 100% stenosed.  Balloon angioplasty was performed using a BALLOON SAPPHIRE 2.0X12.  Post intervention, there is a 25% residual stenosis.  Ost Cx to Prox Cx lesion is 80% stenosed.  Mid RCA to Dist RCA lesion is 60% stenosed.  Mid RCA lesion is 70% stenosed.  RPDA lesion is 99% stenosed.  Lat 2nd Mrg lesion is 90% stenosed.  2nd Mrg lesion is 80% stenosed. - Recommend uninterrupted dual antiplatelet therapy with Aspirin 81mg  daily and Clopidogrel 75mg  daily for a minimum of 12 months (ACS - Class I recommendation).  - DAPT can start  after CABG.  For now, will continue IV tirofiban for 18 hours. Depending on if the timing of surgery is delayed from 10/8, could start Plavix and stop 5 days prior to surgery.  Will discuss with Dr. Roxy Manns in AM.   Cardiac cath 09/12/17:  Mid RCA to Dist RCA lesion is 60% stenosed.  Mid RCA lesion is 70% stenosed.  RPDA lesion is 99% stenosed.  Prox Cx to Mid Cx lesion is 99% stenosed.  Ost Cx to Prox Cx lesion is 80% stenosed.  Ost LAD to Prox LAD lesion is 30% stenosed.  Mid LAD-1 lesion is 60% stenosed.  Mid LAD-2 lesion is 70% stenosed.  LV end diastolic pressure is normal. 1. Severe triple vessel CAD 2. Moderately severe stenosis in the mid LAD. Patent stent proximal LAD 3. Severe stenosis involving a heavily calcified segment of the proximal Circumflex. Subtotal occlusion of the mid segment of the large branching obtuse marginal. The stenosis involves both sub-branches of the obtuse marginal 4. The RCA is a  large dominant vessel with a proximal aneurysmal segment followed by an old stent in the mid vessel. There is moderately severe disease in the mid vessel prior to the old stent and within the old stent.  5. Severe aortic stenosis (peak to peak gradient 40 mmHg, mean gradient 33.6 mmHg, AVA 0.87 cm2). By echo the valve leaflets appear to be thickened and calcified and they do not open well. Dimensionless index below 0.25 and AVA 0.77cm2 by echo.  Recommendations: He has complex multivessel CAD and severe aortic valve stenosis. His CAD would be difficult to treat with PCI given the extensive disease in the Circumflex. His distal targets are patent. I think we should consider surgical AVR and CABG. I will arrange for him to be seen by Dr. Roxy Manns or Dr. Cyndia Bent to review options for surgery. He would be a good candidate for TAVR but given his extensive CAD, a percutaneous approach does not seem like the optimal approach.    Echo 09/05/17: Study Conclusions - Left ventricle: The cavity size was normal. Wall thickness was   increased in a pattern of severe LVH. Systolic function was   normal. The estimated ejection fraction was in the range of 60%   to 65%. Wall motion was normal; there were no regional wall   motion abnormalities. Doppler parameters are consistent with   abnormal left ventricular relaxation (grade 1 diastolic   dysfunction). - Ventricular septum: Septal motion showed abnormal function and   dyssynergy. - Aortic valve: Valve mobility was restricted. There was moderate   to severe stenosis. There was mild regurgitation. Peak velocity   (S): 360 cm/s. Mean gradient (S): 28 mm Hg. Valve area (VTI): 0.9   cm^2. Valve area (Vmax): 0.77 cm^2. Valve area (Vmean): 0.8 cm^2. - Mitral valve: There was mild regurgitation. - Left atrium: Volume/bsa, ES, (1-plane Simpson&'s, A2C): 27.1   ml/m^2.  Carotid U/S 10/02/17: Final Interpretation: - Right Carotid: Velocities in the right ICA are  consistent with a 1-39% stenosis. - Left Carotid: Velocities in the left ICA are consistent with a 1-39% stenosis. Vertebrals: Bilateral vertebral arteries demonstrate antegrade flow.   Past Medical History:  Diagnosis Date  . Aortic valve stenosis, severe   . CAD S/P percutaneous coronary angioplasty    12-2006  STENT TO LAD;  07-2007 DEStenting TO RCA   . GERD (gastroesophageal reflux disease)   . Hypertension   . Iron deficiency anemia   . Mixed hyperlipidemia   . OA (  osteoarthritis) of shoulder    LEFT SHOULDER AND AC JOINT  . Type 2 diabetes mellitus ([place])     Past Surgical History:  Procedure Laterality Date  . APPENDECTOMY    . BACK SURGERY  x3  . RIGHT/LEFT HEART CATH AND CORONARY ANGIOGRAPHY N/A 09/12/2017   Procedure: RIGHT/LEFT HEART CATH AND CORONARY ANGIOGRAPHY;  Surgeon: Burnell Blanks, MD;  Location: [facility];  Service: Cardiovascular;  Laterality: N/A;  . TONSILLECTOMY      MEDICATIONS: . aspirin EC 81 MG tablet  . b complex vitamins tablet  . ferrous sulfate 324 (65 Fe) MG TBEC  . gabapentin (NEURONTIN) 300 MG capsule  . glipiZIDE (GLUCOTROL XL) 5 MG 24 hr tablet  . Glucos-Chond-Hyal Ac-Ca Fructo (MOVE FREE JOINT HEALTH ADVANCE PO)  . HYDROcodone-acetaminophen (NORCO) 10-325 MG tablet  . metFORMIN (GLUCOPHAGE) 500 MG tablet  . naproxen sodium (ALEVE) 220 MG tablet  . nitroGLYCERIN (NITROSTAT) 0.4 MG SL tablet  . pantoprazole (PROTONIX) 40 MG tablet  . Polyethylene Glycol 3350 (MIRALAX PO)  . predniSONE (DELTASONE) 50 MG tablet  . ramipril (ALTACE) 5 MG capsule  . simvastatin (ZOCOR) 40 MG tablet  . zolpidem (AMBIEN) 10 MG tablet   No current facility-administered medications for this encounter.    . iopamidol (ISOVUE-370) 76 % injection  . metoprolol tartrate (LOPRESSOR) injection 5 mg    George Hugh [facility] Stay [facility]/Anesthesiology Phone [phone] 10/02/2017 4:51 PM

## 2017-10-02 NOTE — Progress Notes (Signed)
Note sent through Epic to Dr. Roxy Manns re: abnormal labs.

## 2017-10-03 ENCOUNTER — Inpatient Hospital Stay (HOSPITAL_COMMUNITY): Payer: Medicare HMO | Admitting: Anesthesiology

## 2017-10-03 ENCOUNTER — Encounter (HOSPITAL_COMMUNITY): Payer: Self-pay | Admitting: Interventional Cardiology

## 2017-10-03 ENCOUNTER — Encounter (HOSPITAL_COMMUNITY)
Admission: AD | Disposition: A | Discharge status: Home / Self Care | Payer: Self-pay | Source: Home / Self Care | Attending: Thoracic Surgery (Cardiothoracic Vascular Surgery)

## 2017-10-03 ENCOUNTER — Inpatient Hospital Stay (HOSPITAL_COMMUNITY): Payer: Medicare HMO

## 2017-10-03 DIAGNOSIS — I350 Nonrheumatic aortic (valve) stenosis: Secondary | ICD-10-CM

## 2017-10-03 DIAGNOSIS — I10 Essential (primary) hypertension: Secondary | ICD-10-CM

## 2017-10-03 DIAGNOSIS — Z953 Presence of xenogenic heart valve: Secondary | ICD-10-CM

## 2017-10-03 DIAGNOSIS — Z951 Presence of aortocoronary bypass graft: Secondary | ICD-10-CM

## 2017-10-03 DIAGNOSIS — I25110 Atherosclerotic heart disease of native coronary artery with unstable angina pectoris: Secondary | ICD-10-CM

## 2017-10-03 HISTORY — DX: Presence of xenogenic heart valve: Z95.3

## 2017-10-03 HISTORY — PX: AORTIC VALVE REPLACEMENT: SHX41

## 2017-10-03 HISTORY — DX: Presence of aortocoronary bypass graft: Z95.1

## 2017-10-03 HISTORY — PX: CORONARY ARTERY BYPASS GRAFT: SHX141

## 2017-10-03 LAB — POCT I-STAT 3, ART BLOOD GAS (G3+)
ACID-BASE DEFICIT: 1 mmol/L (ref 0.0–2.0)
Acid-base deficit: 4 mmol/L — ABNORMAL HIGH (ref 0.0–2.0)
BICARBONATE: 25.8 mmol/L (ref 20.0–28.0)
BICARBONATE: 23.6 mmol/L (ref 20.0–28.0)
Bicarbonate: 21.6 mmol/L (ref 20.0–28.0)
O2 SAT: 100 %
O2 SAT: 91 %
O2 Saturation: 98 %
PCO2 ART: 39.2 mmHg (ref 32.0–48.0)
PH ART: 7.331 — AB (ref 7.350–7.450)
PH ART: 7.346 — AB (ref 7.350–7.450)
PO2 ART: 60 mmHg — AB (ref 83.0–108.0)
TCO2: 27 mmol/L (ref 22–32)
TCO2: 23 mmol/L (ref 22–32)
TCO2: 25 mmol/L (ref 22–32)
pCO2 arterial: 48.9 mmHg — ABNORMAL HIGH (ref 32.0–48.0)
pCO2 arterial: 37.2 mmHg (ref 32.0–48.0)
pH, Arterial: 7.409 (ref 7.350–7.450)
pO2, Arterial: 386 mmHg — ABNORMAL HIGH (ref 83.0–108.0)
pO2, Arterial: 102 mmHg (ref 83.0–108.0)

## 2017-10-03 LAB — POCT I-STAT, CHEM 8
BUN: 33 mg/dL — AB (ref 8–23)
BUN: 29 mg/dL — ABNORMAL HIGH (ref 8–23)
BUN: 32 mg/dL — AB (ref 8–23)
BUN: 28 mg/dL — AB (ref 8–23)
BUN: 29 mg/dL — ABNORMAL HIGH (ref 8–23)
BUN: 27 mg/dL — ABNORMAL HIGH (ref 8–23)
BUN: 24 mg/dL — AB (ref 8–23)
CALCIUM ION: 1.11 mmol/L — AB (ref 1.15–1.40)
CALCIUM ION: 1.31 mmol/L (ref 1.15–1.40)
CALCIUM ION: 1.05 mmol/L — AB (ref 1.15–1.40)
CALCIUM ION: 1.09 mmol/L — AB (ref 1.15–1.40)
CALCIUM ION: 1.12 mmol/L — AB (ref 1.15–1.40)
CHLORIDE: 104 mmol/L (ref 98–111)
CHLORIDE: 108 mmol/L (ref 98–111)
CHLORIDE: 110 mmol/L (ref 98–111)
CREATININE: 0.9 mg/dL (ref 0.61–1.24)
CREATININE: 0.8 mg/dL (ref 0.61–1.24)
CREATININE: 0.7 mg/dL (ref 0.61–1.24)
CREATININE: 0.9 mg/dL (ref 0.61–1.24)
Calcium, Ion: 1.35 mmol/L (ref 1.15–1.40)
Calcium, Ion: 1.07 mmol/L — ABNORMAL LOW (ref 1.15–1.40)
Chloride: 107 mmol/L (ref 98–111)
Chloride: 104 mmol/L (ref 98–111)
Chloride: 105 mmol/L (ref 98–111)
Chloride: 106 mmol/L (ref 98–111)
Creatinine, Ser: 0.7 mg/dL (ref 0.61–1.24)
Creatinine, Ser: 0.7 mg/dL (ref 0.61–1.24)
Creatinine, Ser: 0.8 mg/dL (ref 0.61–1.24)
GLUCOSE: 211 mg/dL — AB (ref 70–99)
GLUCOSE: 158 mg/dL — AB (ref 70–99)
GLUCOSE: 186 mg/dL — AB (ref 70–99)
GLUCOSE: 147 mg/dL — AB (ref 70–99)
GLUCOSE: 154 mg/dL — AB (ref 70–99)
GLUCOSE: 125 mg/dL — AB (ref 70–99)
Glucose, Bld: 150 mg/dL — ABNORMAL HIGH (ref 70–99)
HCT: 25 % — ABNORMAL LOW (ref 39.0–52.0)
HCT: 30 % — ABNORMAL LOW (ref 39.0–52.0)
HCT: 22 % — ABNORMAL LOW (ref 39.0–52.0)
HCT: 19 % — ABNORMAL LOW (ref 39.0–52.0)
HEMATOCRIT: 32 % — AB (ref 39.0–52.0)
HEMATOCRIT: 23 % — AB (ref 39.0–52.0)
HEMATOCRIT: 23 % — AB (ref 39.0–52.0)
HEMOGLOBIN: 7.8 g/dL — AB (ref 13.0–17.0)
HEMOGLOBIN: 7.8 g/dL — AB (ref 13.0–17.0)
HEMOGLOBIN: 7.5 g/dL — AB (ref 13.0–17.0)
Hemoglobin: 10.9 g/dL — ABNORMAL LOW (ref 13.0–17.0)
Hemoglobin: 10.2 g/dL — ABNORMAL LOW (ref 13.0–17.0)
Hemoglobin: 8.5 g/dL — ABNORMAL LOW (ref 13.0–17.0)
Hemoglobin: 6.5 g/dL — CL (ref 13.0–17.0)
POTASSIUM: 4.3 mmol/L (ref 3.5–5.1)
POTASSIUM: 4.4 mmol/L (ref 3.5–5.1)
Potassium: 4.4 mmol/L (ref 3.5–5.1)
Potassium: 4.6 mmol/L (ref 3.5–5.1)
Potassium: 4.9 mmol/L (ref 3.5–5.1)
Potassium: 4 mmol/L (ref 3.5–5.1)
Potassium: 4.3 mmol/L (ref 3.5–5.1)
SODIUM: 142 mmol/L (ref 135–145)
SODIUM: 139 mmol/L (ref 135–145)
SODIUM: 140 mmol/L (ref 135–145)
Sodium: 141 mmol/L (ref 135–145)
Sodium: 140 mmol/L (ref 135–145)
Sodium: 139 mmol/L (ref 135–145)
Sodium: 145 mmol/L (ref 135–145)
TCO2: 25 mmol/L (ref 22–32)
TCO2: 25 mmol/L (ref 22–32)
TCO2: 25 mmol/L (ref 22–32)
TCO2: 23 mmol/L (ref 22–32)
TCO2: 25 mmol/L (ref 22–32)
TCO2: 24 mmol/L (ref 22–32)
TCO2: 24 mmol/L (ref 22–32)

## 2017-10-03 LAB — POCT I-STAT 4, (NA,K, GLUC, HGB,HCT)
GLUCOSE: 120 mg/dL — AB (ref 70–99)
HEMATOCRIT: 28 % — AB (ref 39.0–52.0)
HEMOGLOBIN: 9.5 g/dL — AB (ref 13.0–17.0)
Potassium: 3.5 mmol/L (ref 3.5–5.1)
SODIUM: 146 mmol/L — AB (ref 135–145)

## 2017-10-03 LAB — TYPE AND SCREEN
ABO/RH(D): B NEG
ANTIBODY SCREEN: NEGATIVE

## 2017-10-03 LAB — GLUCOSE, CAPILLARY
GLUCOSE-CAPILLARY: 253 mg/dL — AB (ref 70–99)
Glucose-Capillary: 282 mg/dL — ABNORMAL HIGH (ref 70–99)
Glucose-Capillary: 343 mg/dL — ABNORMAL HIGH (ref 70–99)

## 2017-10-03 LAB — BASIC METABOLIC PANEL
Anion gap: 9 (ref 5–15)
BUN: 33 mg/dL — ABNORMAL HIGH (ref 8–23)
CHLORIDE: 108 mmol/L (ref 98–111)
CO2: 22 mmol/L (ref 22–32)
Calcium: 9.3 mg/dL (ref 8.9–10.3)
Creatinine, Ser: 1.14 mg/dL (ref 0.61–1.24)
GFR calc Af Amer: >60 mL/min (ref >60)
GFR calc non Af Amer: 60 mL/min — ABNORMAL LOW (ref >60)
GLUCOSE: 302 mg/dL — AB (ref 70–99)
Potassium: 4.4 mmol/L (ref 3.5–5.1)
Sodium: 139 mmol/L (ref 135–145)

## 2017-10-03 LAB — CBC
HCT: 39.6 % (ref 39.0–52.0)
HCT: 29.6 % — ABNORMAL LOW (ref 39.0–52.0)
HEMOGLOBIN: 9.6 g/dL — AB (ref 13.0–17.0)
Hemoglobin: 13.3 g/dL (ref 13.0–17.0)
MCH: 32.8 pg (ref 26.0–34.0)
MCH: 32.1 pg (ref 26.0–34.0)
MCHC: 33.6 g/dL (ref 30.0–36.0)
MCHC: 32.4 g/dL (ref 30.0–36.0)
MCV: 97.5 fL (ref 78.0–100.0)
MCV: 99 fL (ref 78.0–100.0)
PLATELETS: 124 10*3/uL — AB (ref 150–400)
Platelets: 223 10*3/uL (ref 150–400)
RBC: 4.06 MIL/uL — ABNORMAL LOW (ref 4.22–5.81)
RBC: 2.99 MIL/uL — AB (ref 4.22–5.81)
RDW: 14.3 % (ref 11.5–15.5)
RDW: 14.4 % (ref 11.5–15.5)
WBC: 15.7 10*3/uL — ABNORMAL HIGH (ref 4.0–10.5)
WBC: 17.5 10*3/uL — AB (ref 4.0–10.5)

## 2017-10-03 LAB — APTT: APTT: 32 s (ref 24–36)

## 2017-10-03 LAB — PREPARE RBC (CROSSMATCH)

## 2017-10-03 LAB — HEMOGLOBIN A1C
Hgb A1c MFr Bld: 8.5 % — ABNORMAL HIGH (ref 4.8–5.6)
Mean Plasma Glucose: 197 mg/dL

## 2017-10-03 LAB — POCT ACTIVATED CLOTTING TIME
ACTIVATED CLOTTING TIME: 257 s
Activated Clotting Time: 263 seconds

## 2017-10-03 LAB — TROPONIN I
Troponin I: 0.21 ng/mL (ref <0.03)
Troponin I: 0.26 ng/mL (ref <0.03)

## 2017-10-03 LAB — PROTIME-INR
INR: 1.59
PROTHROMBIN TIME: 18.8 s — AB (ref 11.4–15.2)

## 2017-10-03 LAB — HEMOGLOBIN AND HEMATOCRIT, BLOOD
HEMATOCRIT: 24.6 % — AB (ref 39.0–52.0)
HEMOGLOBIN: 8.2 g/dL — AB (ref 13.0–17.0)

## 2017-10-03 LAB — PLATELET COUNT: PLATELETS: 126 10*3/uL — AB (ref 150–400)

## 2017-10-03 SURGERY — REPLACEMENT, AORTIC VALVE, OPEN
Anesthesia: General | Site: Chest

## 2017-10-03 MED ORDER — ALBUMIN HUMAN 5 % IV SOLN
250.0000 mL | INTRAVENOUS | Status: AC | PRN
  Administered 2017-10-03 (×4): 12.5 g via INTRAVENOUS
  Filled 2017-10-03: qty 500
  Filled 2017-10-03: qty 250

## 2017-10-03 MED ORDER — PROMETHAZINE HCL 25 MG/ML IJ SOLN: 6.2500 mg | INTRAMUSCULAR | Status: DC | PRN

## 2017-10-03 MED ORDER — ROCURONIUM BROMIDE 50 MG/5ML IV SOSY
PREFILLED_SYRINGE | INTRAVENOUS | Status: AC
  Filled 2017-10-03: qty 10

## 2017-10-03 MED ORDER — PHENYLEPHRINE HCL-NACL 20-0.9 MG/250ML-% IV SOLN
0.0000 ug/min | INTRAVENOUS | Status: DC
  Administered 2017-10-04 (×2): 60 ug/min via INTRAVENOUS
  Filled 2017-10-03 (×3): qty 250

## 2017-10-03 MED ORDER — INSULIN ASPART 100 UNIT/ML ~~LOC~~ SOLN
0.0000 [IU] | Freq: Three times a day (TID) | SUBCUTANEOUS | Status: DC
  Administered 2017-10-03: 11 [IU] via SUBCUTANEOUS

## 2017-10-03 MED ORDER — TRANEXAMIC ACID (OHS) PUMP PRIME SOLUTION
2.0000 mg/kg | INTRAVENOUS | Status: DC
  Filled 2017-10-03: qty 1.96

## 2017-10-03 MED ORDER — HEPARIN SODIUM (PORCINE) 1000 UNIT/ML IJ SOLN
INTRAMUSCULAR | Status: AC
  Filled 2017-10-03: qty 1

## 2017-10-03 MED ORDER — ARTIFICIAL TEARS OPHTHALMIC OINT
TOPICAL_OINTMENT | OPHTHALMIC | Status: AC
  Filled 2017-10-03: qty 3.5

## 2017-10-03 MED ORDER — CHLORHEXIDINE GLUCONATE 0.12% ORAL RINSE (MEDLINE KIT)
15.0000 mL | Freq: Two times a day (BID) | OROMUCOSAL | Status: DC
  Administered 2017-10-03: 15 mL via OROMUCOSAL

## 2017-10-03 MED ORDER — KENNESTONE BLOOD CARDIOPLEGIA VIAL
13.0000 mL | Freq: Once | Status: DC
  Filled 2017-10-03: qty 13

## 2017-10-03 MED ORDER — ONDANSETRON HCL 4 MG/2ML IJ SOLN
INTRAMUSCULAR | Status: AC
  Filled 2017-10-03: qty 2

## 2017-10-03 MED ORDER — PHENYLEPHRINE 40 MCG/ML (10ML) SYRINGE FOR IV PUSH (FOR BLOOD PRESSURE SUPPORT)
PREFILLED_SYRINGE | INTRAVENOUS | Status: AC
  Filled 2017-10-03: qty 10

## 2017-10-03 MED ORDER — LACTATED RINGERS IV SOLN
INTRAVENOUS | Status: DC
  Administered 2017-10-03 (×2): via INTRAVENOUS

## 2017-10-03 MED ORDER — CHLORHEXIDINE GLUCONATE CLOTH 2 % EX PADS
6.0000 | MEDICATED_PAD | Freq: Once | CUTANEOUS | Status: AC
  Administered 2017-10-03: 6 via TOPICAL

## 2017-10-03 MED ORDER — DEXMEDETOMIDINE HCL IN NACL 200 MCG/50ML IV SOLN
INTRAVENOUS | Status: AC
  Filled 2017-10-03: qty 50

## 2017-10-03 MED ORDER — OXYCODONE HCL 5 MG PO TABS
5.0000 mg | ORAL_TABLET | ORAL | Status: DC | PRN
  Administered 2017-10-04 – 2017-10-07 (×11): 10 mg via ORAL
  Filled 2017-10-03 (×11): qty 2

## 2017-10-03 MED ORDER — SODIUM CHLORIDE 0.45 % IV SOLN
INTRAVENOUS | Status: DC | PRN
  Administered 2017-10-03: 17:00:00 via INTRAVENOUS

## 2017-10-03 MED ORDER — SODIUM CHLORIDE 0.9 % IV SOLN
INTRAVENOUS | Status: DC
  Filled 2017-10-03: qty 1

## 2017-10-03 MED ORDER — NITROGLYCERIN IN D5W 200-5 MCG/ML-% IV SOLN
2.0000 ug/min | INTRAVENOUS | Status: AC
  Administered 2017-10-03: 16.6 ug/min via INTRAVENOUS
  Filled 2017-10-03: qty 250

## 2017-10-03 MED ORDER — SODIUM CHLORIDE 0.9% IV SOLUTION
Freq: Once | INTRAVENOUS | Status: AC
  Administered 2017-10-03: 22:00:00 via INTRAVENOUS

## 2017-10-03 MED ORDER — INSULIN REGULAR BOLUS VIA INFUSION
0.0000 [IU] | Freq: Three times a day (TID) | INTRAVENOUS | Status: DC
  Filled 2017-10-03: qty 10

## 2017-10-03 MED ORDER — MIDAZOLAM HCL 2 MG/2ML IJ SOLN
INTRAMUSCULAR | Status: AC
  Administered 2017-10-03: 2 mg via INTRAVENOUS
  Filled 2017-10-03: qty 2

## 2017-10-03 MED ORDER — METOPROLOL TARTRATE 12.5 MG HALF TABLET
12.5000 mg | ORAL_TABLET | Freq: Once | ORAL | Status: DC
  Filled 2017-10-03: qty 1

## 2017-10-03 MED ORDER — SODIUM CHLORIDE 0.9% IV SOLUTION: Freq: Once | INTRAVENOUS | Status: DC

## 2017-10-03 MED ORDER — FENTANYL CITRATE (PF) 100 MCG/2ML IJ SOLN
100.0000 ug | Freq: Once | INTRAMUSCULAR | Status: AC
  Administered 2017-10-03: 100 ug via INTRAVENOUS

## 2017-10-03 MED ORDER — ARTIFICIAL TEARS OPHTHALMIC OINT
TOPICAL_OINTMENT | OPHTHALMIC | Status: DC | PRN
  Administered 2017-10-03: 1 via OPHTHALMIC

## 2017-10-03 MED ORDER — MIDAZOLAM HCL 10 MG/2ML IJ SOLN
INTRAMUSCULAR | Status: AC
  Filled 2017-10-03: qty 2

## 2017-10-03 MED ORDER — FENTANYL CITRATE (PF) 100 MCG/2ML IJ SOLN
INTRAMUSCULAR | Status: AC
  Administered 2017-10-03: 100 ug via INTRAVENOUS
  Filled 2017-10-03: qty 2

## 2017-10-03 MED ORDER — ACETAMINOPHEN 160 MG/5ML PO SOLN: 1000.0000 mg | Freq: Four times a day (QID) | ORAL | Status: DC

## 2017-10-03 MED ORDER — SODIUM CHLORIDE 0.9 % IV SOLN: 250.0000 mL | INTRAVENOUS | Status: DC

## 2017-10-03 MED ORDER — HEPARIN SODIUM (PORCINE) 1000 UNIT/ML IJ SOLN
INTRAMUSCULAR | Status: DC | PRN
  Administered 2017-10-03: 35000 [IU] via INTRAVENOUS

## 2017-10-03 MED ORDER — PROTAMINE SULFATE 10 MG/ML IV SOLN
INTRAVENOUS | Status: DC | PRN
  Administered 2017-10-03: 10 mg via INTRAVENOUS
  Administered 2017-10-03: 140 mg via INTRAVENOUS
  Administered 2017-10-03: 150 mg via INTRAVENOUS

## 2017-10-03 MED ORDER — BISACODYL 5 MG PO TBEC
10.0000 mg | DELAYED_RELEASE_TABLET | Freq: Every day | ORAL | Status: DC
  Administered 2017-10-04 – 2017-10-08 (×5): 10 mg via ORAL
  Filled 2017-10-03 (×5): qty 2

## 2017-10-03 MED ORDER — SODIUM CHLORIDE 0.9 % IV SOLN
INTRAVENOUS | Status: DC
  Filled 2017-10-03: qty 30

## 2017-10-03 MED ORDER — MORPHINE SULFATE (PF) 2 MG/ML IV SOLN
1.0000 mg | INTRAVENOUS | Status: DC | PRN
  Administered 2017-10-03 – 2017-10-06 (×6): 2 mg via INTRAVENOUS
  Filled 2017-10-03 (×6): qty 1

## 2017-10-03 MED ORDER — FENTANYL CITRATE (PF) 100 MCG/2ML IJ SOLN: 25.0000 ug | INTRAMUSCULAR | Status: DC | PRN

## 2017-10-03 MED ORDER — FAMOTIDINE IN NACL 20-0.9 MG/50ML-% IV SOLN
20.0000 mg | Freq: Two times a day (BID) | INTRAVENOUS | Status: DC
  Administered 2017-10-03: 20 mg via INTRAVENOUS
  Filled 2017-10-03: qty 50

## 2017-10-03 MED ORDER — CHLORHEXIDINE GLUCONATE 0.12 % MT SOLN
15.0000 mL | Freq: Once | OROMUCOSAL | Status: AC
  Administered 2017-10-03: 15 mL via OROMUCOSAL
  Filled 2017-10-03: qty 15

## 2017-10-03 MED ORDER — POTASSIUM CHLORIDE 2 MEQ/ML IV SOLN
80.0000 meq | INTRAVENOUS | Status: DC
  Filled 2017-10-03: qty 40

## 2017-10-03 MED ORDER — PROPOFOL 10 MG/ML IV BOLUS
INTRAVENOUS | Status: AC
  Filled 2017-10-03: qty 20

## 2017-10-03 MED ORDER — SODIUM CHLORIDE 0.9% FLUSH: 3.0000 mL | INTRAVENOUS | Status: DC | PRN

## 2017-10-03 MED ORDER — FENTANYL CITRATE (PF) 250 MCG/5ML IJ SOLN
INTRAMUSCULAR | Status: DC | PRN
  Administered 2017-10-03: 150 ug via INTRAVENOUS
  Administered 2017-10-03: 100 ug via INTRAVENOUS
  Administered 2017-10-03: 500 ug via INTRAVENOUS
  Administered 2017-10-03 (×2): 250 ug via INTRAVENOUS

## 2017-10-03 MED ORDER — ONDANSETRON HCL 4 MG/2ML IJ SOLN
4.0000 mg | Freq: Four times a day (QID) | INTRAMUSCULAR | Status: DC | PRN
  Administered 2017-10-04 – 2017-10-06 (×5): 4 mg via INTRAVENOUS
  Filled 2017-10-03 (×5): qty 2

## 2017-10-03 MED ORDER — ACETAMINOPHEN 500 MG PO TABS
1000.0000 mg | ORAL_TABLET | Freq: Four times a day (QID) | ORAL | Status: DC
  Administered 2017-10-04 – 2017-10-08 (×17): 1000 mg via ORAL
  Filled 2017-10-03 (×17): qty 2

## 2017-10-03 MED ORDER — MIDAZOLAM HCL 5 MG/5ML IJ SOLN
INTRAMUSCULAR | Status: DC | PRN
  Administered 2017-10-03 (×5): 2 mg via INTRAVENOUS

## 2017-10-03 MED ORDER — POTASSIUM CHLORIDE 10 MEQ/50ML IV SOLN
10.0000 meq | INTRAVENOUS | Status: AC
  Administered 2017-10-03 (×3): 10 meq via INTRAVENOUS

## 2017-10-03 MED ORDER — NITROGLYCERIN IN D5W 200-5 MCG/ML-% IV SOLN: 0.0000 ug/min | INTRAVENOUS | Status: DC

## 2017-10-03 MED ORDER — MIDAZOLAM HCL 2 MG/2ML IJ SOLN: 2.0000 mg | INTRAMUSCULAR | Status: DC | PRN

## 2017-10-03 MED ORDER — PANTOPRAZOLE SODIUM 40 MG PO TBEC: 40.0000 mg | DELAYED_RELEASE_TABLET | Freq: Every day | ORAL | Status: DC

## 2017-10-03 MED ORDER — SODIUM CHLORIDE 0.9% FLUSH
3.0000 mL | Freq: Two times a day (BID) | INTRAVENOUS | Status: DC
  Administered 2017-10-04 – 2017-10-07 (×8): 3 mL via INTRAVENOUS

## 2017-10-03 MED ORDER — ASPIRIN EC 325 MG PO TBEC: 325.0000 mg | DELAYED_RELEASE_TABLET | Freq: Every day | ORAL | Status: DC

## 2017-10-03 MED ORDER — FENTANYL CITRATE (PF) 250 MCG/5ML IJ SOLN
INTRAMUSCULAR | Status: AC
  Filled 2017-10-03: qty 5

## 2017-10-03 MED ORDER — DOCUSATE SODIUM 100 MG PO CAPS
200.0000 mg | ORAL_CAPSULE | Freq: Every day | ORAL | Status: DC
  Administered 2017-10-04 – 2017-10-08 (×5): 200 mg via ORAL
  Filled 2017-10-03 (×5): qty 2

## 2017-10-03 MED ORDER — KENNESTONE BLOOD CARDIOPLEGIA (KBC) MANNITOL SYRINGE (20%, 32ML)
32.0000 mL | Freq: Once | INTRAVENOUS | Status: DC
  Filled 2017-10-03: qty 32

## 2017-10-03 MED ORDER — SODIUM CHLORIDE 0.9 % IR SOLN
Status: DC | PRN
  Administered 2017-10-03: 5000 mL/h

## 2017-10-03 MED ORDER — ROCURONIUM BROMIDE 50 MG/5ML IV SOSY
PREFILLED_SYRINGE | INTRAVENOUS | Status: AC
  Filled 2017-10-03: qty 5

## 2017-10-03 MED ORDER — METOPROLOL TARTRATE 5 MG/5ML IV SOLN: 2.5000 mg | INTRAVENOUS | Status: DC | PRN

## 2017-10-03 MED ORDER — LACTATED RINGERS IV SOLN
INTRAVENOUS | Status: DC | PRN
  Administered 2017-10-03: 11:00:00 via INTRAVENOUS

## 2017-10-03 MED ORDER — CHLORHEXIDINE GLUCONATE 0.12 % MT SOLN: 15.0000 mL | Freq: Once | OROMUCOSAL | Status: DC

## 2017-10-03 MED ORDER — DEXMEDETOMIDINE HCL IN NACL 400 MCG/100ML IV SOLN
0.1000 ug/kg/h | INTRAVENOUS | Status: AC
  Administered 2017-10-03: .3 ug/kg/h via INTRAVENOUS
  Filled 2017-10-03: qty 100

## 2017-10-03 MED ORDER — TRANEXAMIC ACID 1000 MG/10ML IV SOLN
1.5000 mg/kg/h | INTRAVENOUS | Status: AC
  Administered 2017-10-03: 1.5 mg/kg/h via INTRAVENOUS
  Filled 2017-10-03: qty 25

## 2017-10-03 MED ORDER — COAGULATION FACTOR VIIA RECOMB 1 MG IV SOLR
45.0000 ug/kg | Freq: Once | INTRAVENOUS | Status: AC
  Administered 2017-10-03: 4000 ug via INTRAVENOUS
  Filled 2017-10-03: qty 4

## 2017-10-03 MED ORDER — LACTATED RINGERS IV SOLN: INTRAVENOUS | Status: DC

## 2017-10-03 MED ORDER — LIDOCAINE 2% (20 MG/ML) 5 ML SYRINGE
INTRAMUSCULAR | Status: AC
  Filled 2017-10-03: qty 5

## 2017-10-03 MED ORDER — TRAMADOL HCL 50 MG PO TABS
50.0000 mg | ORAL_TABLET | ORAL | Status: DC | PRN
  Administered 2017-10-04: 100 mg via ORAL
  Administered 2017-10-05: 50 mg via ORAL
  Administered 2017-10-06: 100 mg via ORAL
  Filled 2017-10-03 (×3): qty 2

## 2017-10-03 MED ORDER — METOPROLOL TARTRATE 25 MG/10 ML ORAL SUSPENSION: 12.5000 mg | Freq: Two times a day (BID) | ORAL | Status: DC

## 2017-10-03 MED ORDER — DEXMEDETOMIDINE HCL IN NACL 200 MCG/50ML IV SOLN
0.0000 ug/kg/h | INTRAVENOUS | Status: DC
  Administered 2017-10-03: 0.4 ug/kg/h via INTRAVENOUS
  Filled 2017-10-03: qty 50

## 2017-10-03 MED ORDER — ROCURONIUM BROMIDE 10 MG/ML (PF) SYRINGE
PREFILLED_SYRINGE | INTRAVENOUS | Status: DC | PRN
  Administered 2017-10-03 (×4): 50 mg via INTRAVENOUS

## 2017-10-03 MED ORDER — SODIUM CHLORIDE 0.9% IV SOLUTION: Freq: Once | INTRAVENOUS | Status: AC

## 2017-10-03 MED ORDER — FENTANYL CITRATE (PF) 250 MCG/5ML IJ SOLN
INTRAMUSCULAR | Status: AC
  Filled 2017-10-03: qty 20

## 2017-10-03 MED ORDER — SODIUM CHLORIDE 0.9 % IV SOLN: 1.5000 g | Freq: Two times a day (BID) | INTRAVENOUS | Status: DC

## 2017-10-03 MED ORDER — METOPROLOL TARTRATE 12.5 MG HALF TABLET
12.5000 mg | ORAL_TABLET | Freq: Two times a day (BID) | ORAL | Status: DC
  Administered 2017-10-04: 12.5 mg via ORAL
  Filled 2017-10-03: qty 1

## 2017-10-03 MED ORDER — SODIUM CHLORIDE 0.9 % IV SOLN
INTRAVENOUS | Status: DC
  Administered 2017-10-03: 17:00:00 via INTRAVENOUS

## 2017-10-03 MED ORDER — PROTAMINE SULFATE 10 MG/ML IV SOLN
INTRAVENOUS | Status: AC
  Filled 2017-10-03: qty 5

## 2017-10-03 MED ORDER — PROTAMINE SULFATE 10 MG/ML IV SOLN
INTRAVENOUS | Status: AC
  Filled 2017-10-03: qty 25

## 2017-10-03 MED ORDER — ORAL CARE MOUTH RINSE
15.0000 mL | OROMUCOSAL | Status: DC
  Administered 2017-10-03: 15 mL via OROMUCOSAL

## 2017-10-03 MED ORDER — PROPOFOL 10 MG/ML IV BOLUS
INTRAVENOUS | Status: DC | PRN
  Administered 2017-10-03: 50 mg via INTRAVENOUS

## 2017-10-03 MED ORDER — METOPROLOL TARTRATE 12.5 MG HALF TABLET: 12.5000 mg | ORAL_TABLET | Freq: Once | ORAL | Status: DC

## 2017-10-03 MED ORDER — VANCOMYCIN HCL 1000 MG IV SOLR
INTRAVENOUS | Status: DC | PRN
  Administered 2017-10-03: 1000 mL

## 2017-10-03 MED ORDER — ASPIRIN 81 MG PO CHEW: 324.0000 mg | CHEWABLE_TABLET | Freq: Every day | ORAL | Status: DC

## 2017-10-03 MED ORDER — MILRINONE LACTATE IN DEXTROSE 20-5 MG/100ML-% IV SOLN
0.3000 ug/kg/min | INTRAVENOUS | Status: DC
  Filled 2017-10-03: qty 100

## 2017-10-03 MED ORDER — LEVOFLOXACIN IN D5W 500 MG/100ML IV SOLN
500.0000 mg | INTRAVENOUS | Status: AC
  Administered 2017-10-03: 500 mg via INTRAVENOUS
  Filled 2017-10-03: qty 100

## 2017-10-03 MED ORDER — PLASMA-LYTE 148 IV SOLN
INTRAVENOUS | Status: AC
  Administered 2017-10-03: 13:00:00
  Filled 2017-10-03: qty 2.5

## 2017-10-03 MED ORDER — SODIUM CHLORIDE 0.9 % IV SOLN
INTRAVENOUS | Status: DC
  Administered 2017-10-03: 18:00:00 via INTRAVENOUS

## 2017-10-03 MED ORDER — CHLORHEXIDINE GLUCONATE 4 % EX LIQD: 30.0000 mL | CUTANEOUS | Status: DC

## 2017-10-03 MED ORDER — VANCOMYCIN HCL 1000 MG IV SOLR
INTRAVENOUS | Status: AC
  Filled 2017-10-03: qty 1000

## 2017-10-03 MED ORDER — CHLORHEXIDINE GLUCONATE 0.12 % MT SOLN
15.0000 mL | OROMUCOSAL | Status: AC
  Administered 2017-10-03: 15 mL via OROMUCOSAL

## 2017-10-03 MED ORDER — LACTATED RINGERS IV SOLN: 500.0000 mL | Freq: Once | INTRAVENOUS | Status: DC | PRN

## 2017-10-03 MED ORDER — BISACODYL 10 MG RE SUPP
10.0000 mg | Freq: Every day | RECTAL | Status: DC
  Administered 2017-10-07: 10 mg via RECTAL
  Filled 2017-10-03: qty 1

## 2017-10-03 MED ORDER — SODIUM CHLORIDE 0.9 % IJ SOLN
OROMUCOSAL | Status: DC | PRN
  Administered 2017-10-03 (×3): via TOPICAL

## 2017-10-03 MED ORDER — MAGNESIUM SULFATE 50 % IJ SOLN
40.0000 meq | INTRAMUSCULAR | Status: DC
  Filled 2017-10-03: qty 9.85

## 2017-10-03 MED ORDER — ACETAMINOPHEN 650 MG RE SUPP
650.0000 mg | Freq: Once | RECTAL | Status: AC
  Administered 2017-10-03: 650 mg via RECTAL

## 2017-10-03 MED ORDER — MIDAZOLAM HCL 2 MG/2ML IJ SOLN
2.0000 mg | Freq: Once | INTRAMUSCULAR | Status: AC
  Administered 2017-10-03: 2 mg via INTRAVENOUS

## 2017-10-03 MED ORDER — DOPAMINE-DEXTROSE 3.2-5 MG/ML-% IV SOLN
0.0000 ug/kg/min | INTRAVENOUS | Status: DC
  Filled 2017-10-03: qty 250

## 2017-10-03 MED ORDER — EPINEPHRINE PF 1 MG/ML IJ SOLN
0.0000 ug/min | INTRAVENOUS | Status: DC
  Filled 2017-10-03: qty 4

## 2017-10-03 MED ORDER — PHENYLEPHRINE HCL-NACL 20-0.9 MG/250ML-% IV SOLN
30.0000 ug/min | INTRAVENOUS | Status: AC
  Administered 2017-10-03: 20 ug/min via INTRAVENOUS
  Filled 2017-10-03: qty 250

## 2017-10-03 MED ORDER — MORPHINE SULFATE (PF) 2 MG/ML IV SOLN
1.0000 mg | INTRAVENOUS | Status: DC | PRN
  Administered 2017-10-04: 4 mg via INTRAVENOUS
  Filled 2017-10-03: qty 2

## 2017-10-03 MED ORDER — VANCOMYCIN HCL IN DEXTROSE 1-5 GM/200ML-% IV SOLN
1000.0000 mg | Freq: Once | INTRAVENOUS | Status: AC
  Administered 2017-10-04: 1000 mg via INTRAVENOUS
  Filled 2017-10-03: qty 200

## 2017-10-03 MED ORDER — ACETAMINOPHEN 160 MG/5ML PO SOLN: 650.0000 mg | Freq: Once | ORAL | Status: AC

## 2017-10-03 MED ORDER — SODIUM CHLORIDE 0.9 % IV SOLN
INTRAVENOUS | Status: AC
  Administered 2017-10-03: 3 [IU]/h via INTRAVENOUS
  Filled 2017-10-03: qty 1

## 2017-10-03 MED ORDER — LEVOFLOXACIN IN D5W 500 MG/100ML IV SOLN
500.0000 mg | INTRAVENOUS | Status: AC
  Administered 2017-10-04: 500 mg via INTRAVENOUS
  Filled 2017-10-03: qty 100

## 2017-10-03 MED ORDER — ALBUMIN HUMAN 5 % IV SOLN
INTRAVENOUS | Status: DC | PRN
  Administered 2017-10-03 (×3): via INTRAVENOUS

## 2017-10-03 MED ORDER — VANCOMYCIN HCL 10 G IV SOLR
1500.0000 mg | INTRAVENOUS | Status: AC
  Administered 2017-10-03: 1500 mg via INTRAVENOUS
  Filled 2017-10-03: qty 1500

## 2017-10-03 MED ORDER — TRANEXAMIC ACID (OHS) BOLUS VIA INFUSION
15.0000 mg/kg | INTRAVENOUS | Status: AC
  Administered 2017-10-03: 1468.5 mg via INTRAVENOUS
  Filled 2017-10-03: qty 1469

## 2017-10-03 MED ORDER — MAGNESIUM SULFATE 4 GM/100ML IV SOLN
4.0000 g | Freq: Once | INTRAVENOUS | Status: AC
  Administered 2017-10-03: 4 g via INTRAVENOUS
  Filled 2017-10-03: qty 100

## 2017-10-03 SURGICAL SUPPLY — 130 items
ADAPTER CARDIO PERF ANTE/RETRO (ADAPTER) ×2 IMPLANT
BAG DECANTER FOR FLEXI CONT (MISCELLANEOUS) ×4 IMPLANT
BANDAGE ACE 4X5 VEL STRL LF (GAUZE/BANDAGES/DRESSINGS) ×2 IMPLANT
BANDAGE ACE 6X5 VEL STRL LF (GAUZE/BANDAGES/DRESSINGS) ×2 IMPLANT
BASKET HEART (ORDER IN 25'S) (MISCELLANEOUS) ×1
BASKET HEART (ORDER IN 25S) (MISCELLANEOUS) ×1 IMPLANT
BLADE CLIPPER SURG (BLADE) IMPLANT
BLADE STERNUM SYSTEM 6 (BLADE) ×2 IMPLANT
BLADE SURG 11 STRL SS (BLADE) ×2 IMPLANT
BNDG GAUZE ELAST 4 BULKY (GAUZE/BANDAGES/DRESSINGS) ×2 IMPLANT
CANISTER SUCT 3000ML PPV (MISCELLANEOUS) ×2 IMPLANT
CANNULA EZ GLIDE AORTIC 21FR (CANNULA) ×4 IMPLANT
CANNULA GUNDRY RCSP 15FR (MISCELLANEOUS) ×2 IMPLANT
CANNULA SOFTFLOW AORTIC 7M21FR (CANNULA) ×2 IMPLANT
CATH CPB KIT OWEN (MISCELLANEOUS) ×2 IMPLANT
CATH HEART VENT LEFT (CATHETERS) ×1 IMPLANT
CATH THORACIC 36FR (CATHETERS) ×2 IMPLANT
CATH THORACIC 36FR RT ANG (CATHETERS) ×2 IMPLANT
CLIP RETRACTION 3.0MM CORONARY (MISCELLANEOUS) ×2 IMPLANT
CLIP VESOCCLUDE MED 24/CT (CLIP) IMPLANT
CLIP VESOCCLUDE SM WIDE 24/CT (CLIP) ×2 IMPLANT
CONT SPEC 4OZ CLIKSEAL STRL BL (MISCELLANEOUS) ×2 IMPLANT
COVER SURGICAL LIGHT HANDLE (MISCELLANEOUS) ×2 IMPLANT
COVER WAND RF STERILE (DRAPES) ×2 IMPLANT
CRADLE DONUT ADULT HEAD (MISCELLANEOUS) ×2 IMPLANT
DERMABOND ADVANCED (GAUZE/BANDAGES/DRESSINGS) ×1
DERMABOND ADVANCED .7 DNX12 (GAUZE/BANDAGES/DRESSINGS) ×1 IMPLANT
DEVICE SUT CK QUICK LOAD MINI (Prosthesis & Implant Heart) ×4 IMPLANT
DRAIN CHANNEL 32F RND 10.7 FF (WOUND CARE) ×4 IMPLANT
DRAPE BILATERAL SPLIT (DRAPES) IMPLANT
DRAPE CARDIOVASCULAR INCISE (DRAPES) ×1
DRAPE CV SPLIT W-CLR ANES SCRN (DRAPES) IMPLANT
DRAPE INCISE IOBAN 66X45 STRL (DRAPES) ×4 IMPLANT
DRAPE SLUSH/WARMER DISC (DRAPES) ×2 IMPLANT
DRAPE SRG 135X102X78XABS (DRAPES) ×1 IMPLANT
DRSG AQUACEL AG ADV 3.5X14 (GAUZE/BANDAGES/DRESSINGS) ×2 IMPLANT
DRSG COVADERM 4X14 (GAUZE/BANDAGES/DRESSINGS) ×2 IMPLANT
ELECT BLADE 4.0 EZ CLEAN MEGAD (MISCELLANEOUS) ×2
ELECT REM PT RETURN 9FT ADLT (ELECTROSURGICAL) ×4
ELECTRODE BLDE 4.0 EZ CLN MEGD (MISCELLANEOUS) ×1 IMPLANT
ELECTRODE REM PT RTRN 9FT ADLT (ELECTROSURGICAL) ×2 IMPLANT
FELT TEFLON 1X6 (MISCELLANEOUS) ×4 IMPLANT
GAUZE SPONGE 4X4 12PLY STRL (GAUZE/BANDAGES/DRESSINGS) ×4 IMPLANT
GAUZE SPONGE 4X4 12PLY STRL LF (GAUZE/BANDAGES/DRESSINGS) ×4 IMPLANT
GLOVE BIO SURGEON STRL SZ 6 (GLOVE) IMPLANT
GLOVE BIO SURGEON STRL SZ 6.5 (GLOVE) IMPLANT
GLOVE BIO SURGEON STRL SZ7 (GLOVE) IMPLANT
GLOVE BIO SURGEON STRL SZ7.5 (GLOVE) IMPLANT
GLOVE ORTHO TXT STRL SZ7.5 (GLOVE) ×6 IMPLANT
GOWN STRL REUS W/ TWL LRG LVL3 (GOWN DISPOSABLE) ×8 IMPLANT
GOWN STRL REUS W/TWL LRG LVL3 (GOWN DISPOSABLE) ×8
HEMOSTAT POWDER SURGIFOAM 1G (HEMOSTASIS) ×6 IMPLANT
INSERT FOGARTY XLG (MISCELLANEOUS) ×2 IMPLANT
KIT BASIN OR (CUSTOM PROCEDURE TRAY) ×2 IMPLANT
KIT DRAINAGE VACCUM ASSIST (KITS) ×2 IMPLANT
KIT SUCTION CATH 14FR (SUCTIONS) ×6 IMPLANT
KIT SUT CK MINI COMBO 4X17 (Prosthesis & Implant Heart) ×2 IMPLANT
KIT TURNOVER KIT B (KITS) ×2 IMPLANT
KIT VASOVIEW HEMOPRO VH 3000 (KITS) ×2 IMPLANT
LEAD PACING MYOCARDI (MISCELLANEOUS) ×2 IMPLANT
LINE VENT (MISCELLANEOUS) ×2 IMPLANT
MARKER GRAFT CORONARY BYPASS (MISCELLANEOUS) ×6 IMPLANT
NS IRRIG 1000ML POUR BTL (IV SOLUTION) ×12 IMPLANT
PACK E OPEN HEART (SUTURE) ×2 IMPLANT
PACK OPEN HEART (CUSTOM PROCEDURE TRAY) ×2 IMPLANT
PAD ARMBOARD 7.5X6 YLW CONV (MISCELLANEOUS) ×4 IMPLANT
PAD ELECT DEFIB RADIOL ZOLL (MISCELLANEOUS) ×2 IMPLANT
PENCIL BUTTON HOLSTER BLD 10FT (ELECTRODE) ×2 IMPLANT
PUNCH AORTIC ROT 4.0MM RCL 40 (MISCELLANEOUS) ×2 IMPLANT
PUNCH AORTIC ROTATE 4.0MM (MISCELLANEOUS) IMPLANT
PUNCH AORTIC ROTATE 4.5MM 8IN (MISCELLANEOUS) IMPLANT
PUNCH AORTIC ROTATE 5MM 8IN (MISCELLANEOUS) IMPLANT
SET CARDIOPLEGIA MPS 5001102 (MISCELLANEOUS) ×2 IMPLANT
SET IRRIG TUBING LAPAROSCOPIC (IRRIGATION / IRRIGATOR) ×2 IMPLANT
SOLUTION ANTI FOG 6CC (MISCELLANEOUS) ×2 IMPLANT
SPONGE LAP 18X18 X RAY DECT (DISPOSABLE) IMPLANT
SPONGE LAP 4X18 RFD (DISPOSABLE) ×2 IMPLANT
SUT BONE WAX W31G (SUTURE) ×2 IMPLANT
SUT ETHIBON 2 0 V 52N 30 (SUTURE) ×4 IMPLANT
SUT ETHIBON EXCEL 2-0 V-5 (SUTURE) ×4 IMPLANT
SUT ETHIBOND 2 0 SH (SUTURE)
SUT ETHIBOND 2 0 SH 36X2 (SUTURE) IMPLANT
SUT ETHIBOND 2 0 V4 (SUTURE) IMPLANT
SUT ETHIBOND 2 0V4 GREEN (SUTURE) IMPLANT
SUT ETHIBOND 4 0 RB 1 (SUTURE) IMPLANT
SUT ETHIBOND V-5 VALVE (SUTURE) ×4 IMPLANT
SUT ETHIBOND X763 2 0 SH 1 (SUTURE) ×8 IMPLANT
SUT MNCRL AB 3-0 PS2 18 (SUTURE) ×4 IMPLANT
SUT MNCRL AB 4-0 PS2 18 (SUTURE) IMPLANT
SUT PDS AB 1 CTX 36 (SUTURE) ×4 IMPLANT
SUT PROLENE 2 0 SH DA (SUTURE) IMPLANT
SUT PROLENE 3 0 SH 48 (SUTURE) ×2 IMPLANT
SUT PROLENE 3 0 SH DA (SUTURE) ×2 IMPLANT
SUT PROLENE 3 0 SH1 36 (SUTURE) IMPLANT
SUT PROLENE 4 0 RB 1 (SUTURE) ×5
SUT PROLENE 4 0 SH DA (SUTURE) ×8 IMPLANT
SUT PROLENE 4-0 RB1 .5 CRCL 36 (SUTURE) ×5 IMPLANT
SUT PROLENE 5 0 C 1 36 (SUTURE) IMPLANT
SUT PROLENE 6 0 C 1 30 (SUTURE) ×6 IMPLANT
SUT PROLENE 7.0 RB 3 (SUTURE) ×10 IMPLANT
SUT PROLENE 8 0 BV175 6 (SUTURE) IMPLANT
SUT PROLENE BLUE 7 0 (SUTURE) ×4 IMPLANT
SUT PROLENE POLY MONO (SUTURE) IMPLANT
SUT SILK  1 MH (SUTURE) ×2
SUT SILK 1 MH (SUTURE) ×2 IMPLANT
SUT SILK 2 0 SH CR/8 (SUTURE) IMPLANT
SUT SILK 3 0 SH CR/8 (SUTURE) IMPLANT
SUT STEEL 6MS V (SUTURE) IMPLANT
SUT STEEL STERNAL CCS#1 18IN (SUTURE) IMPLANT
SUT STEEL SZ 6 DBL 3X14 BALL (SUTURE) IMPLANT
SUT VIC AB 1 CTX 36 (SUTURE)
SUT VIC AB 1 CTX36XBRD ANBCTR (SUTURE) IMPLANT
SUT VIC AB 2-0 CT1 27 (SUTURE)
SUT VIC AB 2-0 CT1 TAPERPNT 27 (SUTURE) IMPLANT
SUT VIC AB 2-0 CTX 27 (SUTURE) IMPLANT
SUT VIC AB 3-0 SH 27 (SUTURE)
SUT VIC AB 3-0 SH 27X BRD (SUTURE) IMPLANT
SUT VIC AB 3-0 X1 27 (SUTURE) IMPLANT
SUT VICRYL 4-0 PS2 18IN ABS (SUTURE) IMPLANT
SYSTEM SAHARA CHEST DRAIN ATS (WOUND CARE) ×2 IMPLANT
TAPE CLOTH SURG 4X10 WHT LF (GAUZE/BANDAGES/DRESSINGS) ×2 IMPLANT
TAPE PAPER 2X10 WHT MICROPORE (GAUZE/BANDAGES/DRESSINGS) ×2 IMPLANT
TOWEL GREEN STERILE (TOWEL DISPOSABLE) ×2 IMPLANT
TOWEL GREEN STERILE FF (TOWEL DISPOSABLE) ×2 IMPLANT
TRAY FOLEY SLVR 16FR TEMP STAT (SET/KITS/TRAYS/PACK) ×2 IMPLANT
TUBING INSUFFLATION (TUBING) ×2 IMPLANT
UNDERPAD 30X30 (UNDERPADS AND DIAPERS) ×2 IMPLANT
VALVE AORTIC SZ23 INSP/RESIL (Prosthesis & Implant Heart) ×2 IMPLANT
VENT LEFT HEART 12002 (CATHETERS) ×2
WATER STERILE IRR 1000ML POUR (IV SOLUTION) ×4 IMPLANT

## 2017-10-03 NOTE — Progress Notes (Addendum)
Progress Note  Patient Name: Erik Keith Date of Encounter: 10/03/2017  Primary Cardiologist: No primary care provider on file.  Subjective   Patient feeling better today. Denies chest pain or SOB. His AVR/CABG has been moved up to this morning.  Inpatient Medications    Scheduled Meds: . [MAR Hold] aspirin EC  81 mg Oral Daily  . [MAR Hold] atorvastatin  80 mg Oral q1800  . fentaNYL      . [MAR Hold] gabapentin  600 mg Oral QHS  . [MAR Hold] glipiZIDE  5 mg Oral BID  . [MAR Hold] HYDROcodone-acetaminophen  1 tablet Oral BID  . [MAR Hold] insulin aspart  0-15 Units Subcutaneous TID WC  . [MAR Hold] metoprolol tartrate  12.5 mg Oral BID  . [START ON 10/04/2017] metoprolol tartrate  12.5 mg Oral Once  . midazolam      . [MAR Hold] pantoprazole  40 mg Oral QHS  . [MAR Hold] sodium chloride flush  3 mL Intravenous Q12H   Continuous Infusions: . [MAR Hold] sodium chloride    . lactated ringers    . tirofiban Stopped (10/03/17 0700)   PRN Meds: [MAR Hold] sodium chloride, [MAR Hold] acetaminophen, [MAR Hold] nitroGLYCERIN, [MAR Hold] ondansetron (ZOFRAN) IV, [MAR Hold] sodium chloride flush, [MAR Hold] zolpidem   Vital Signs    Vitals:   10/03/17 0600 10/03/17 0728 10/03/17 0800 10/03/17 0900  BP: 112/68  (!) 112/55 (!) 124/59  Pulse: 63  (!) 59   Resp: 17  20 (!) 23  Temp:  98.2 F (36.8 C)    TempSrc:  Oral    SpO2: (!) 88%  94%   Weight:  97.9 kg    Height:  5\' 11"  (1.803 m)      Intake/Output Summary (Last 24 hours) at 10/03/2017 0959 Last data filed at 10/03/2017 0700 Gross per 24 hour  Intake 290.09 ml  Output 1400 ml  Net -1109.91 ml   Filed Weights   10/02/17 2350 10/03/17 0500 10/03/17 0728  Weight: 94.3 kg 97.9 kg 97.9 kg    Telemetry    NSR, few PACs - Personally Reviewed  ECG    NSR, Inferior Q-Waves - Personally Reviewed  Physical Exam   GEN: No acute distress.   Neck: No JVD Cardiac: RRR, no murmurs, rubs, or gallops.    Respiratory: Clear to auscultation bilaterally. GI: Soft, nontender, non-distended  MS: No edema; No deformity; R-radial cath site with some dried blood (evidently from skin tear with bandage removal, No hematoma Neuro:  Nonfocal  Psych: Normal affect   Labs    Chemistry Recent Labs  Lab 10/02/17 1422 10/03/17 0747  NA 135 139  K 4.0 4.4  CL 103 108  CO2 14* 22  GLUCOSE 345* 302*  BUN 19 33*  CREATININE 0.82 1.14  CALCIUM 9.4 9.3  PROT 7.3  --   ALBUMIN 4.0  --   AST 44*  --   ALT 40  --   ALKPHOS 43  --   BILITOT 0.4  --   GFRNONAA >60 60*  GFRAA >60 >60  ANIONGAP 18* 9     Hematology Recent Labs  Lab 10/02/17 1422 10/03/17 0747  WBC 16.1* 15.7*  RBC 4.71 4.06*  HGB 14.9 13.3  HCT 45.0 39.6  MCV 95.5 97.5  MCH 31.6 32.8  MCHC 33.1 33.6  RDW 13.6 14.3  PLT 146* 223    Cardiac Enzymes Recent Labs  Lab 10/03/17 0025 10/03/17 0747  TROPONINI 0.21*  0.26*   No results for input(s): TROPIPOC in the last 168 hours.   BNPNo results for input(s): BNP, PROBNP in the last 168 hours.   DDimer No results for input(s): DDIMER in the last 168 hours.   Radiology    Dg Chest 2 View  Result Date: 10/02/2017 CLINICAL DATA:  Pre-op for open heart and new valve surgery - hx of diabetes, htn, anemia, GERD, exsmoker EXAM: CHEST - 2 VIEW COMPARISON:  Chest x-ray dated 01/30/2017. FINDINGS: The heart size and mediastinal contours are within normal limits. Both lungs are clear. No acute or suspicious osseous finding. IMPRESSION: No active cardiopulmonary disease. Electronically Signed   By: Franki Cabot M.D.   On: 10/02/2017 20:11   Ct Coronary Morph W/cta Cor W/score W/ca W/cm &/or Wo/cm  Addendum Date: 10/02/2017   ADDENDUM REPORT: 10/02/2017 14:45 CLINICAL DATA:  Aortic stenosis SAVR EXAM: Cardiac TAVR CT TECHNIQUE: The patient was scanned on a [employer]. A 120 kV retrospective scan was triggered in the descending thoracic aorta at 111 HU's. Gantry rotation  speed was 270 msecs and collimation was .9 mm. No beta blockade or nitro were given. The 3D data set was reconstructed in 5% intervals of the R-R cycle. Systolic and diastolic phases were analyzed on a dedicated work station using MPR, MIP and VRT modes. The patient received 80 cc of contrast. Initial scan had motion artifact in the aortic root and annulus The patient returned for repeat scanning with 2nd scan having poor opacification but no motion FINDINGS: Aortic Valve: Tri leaflet and heavily calcified. Right coronary cups moves with fusion of non and left cusps Aorta: No aneurysm moderate mixed plaque through out. Normal arch vessels Sinotubular Junction: 27 mm Ascending Thoracic Aorta: 33 mm Aortic Arch: 26 mm Descending Thoracic Aorta: 26 mm Sinus of Valsalva Measurements: Non-coronary: 29.5 mm Right -coronary: 28 mm Left -coronary: 29 mm Coronary Artery Height above Annulus: Left Main: 11.5 mm above annulus Right Coronary: 13.5 mm above annulus Virtual Basal Annulus Measurements: Maximum/Minimum Diameter: 27.8 mm x 22.4 mm Area: 496 mm 2 IMPRESSION: 1. Normal aortic root 3.3 cm with moderate mixed atherosclerotic debris Normal arch vessels 2. Severely calcified tri leaflet AV with restricted leaflet motion Right cusp moves most [place] Electronically Signed   By: Jenkins Rouge M.D.   On: 10/02/2017 14:45   Result Date: 10/02/2017 EXAM: OVER-READ INTERPRETATION  CT CHEST The following report is an over-read performed by radiologist Dr. Salvatore Marvel of [facility], [place] on 10/02/2017. This over-read does not include interpretation of cardiac or coronary anatomy or pathology. The coronary CTA interpretation by the cardiologist is attached. COMPARISON:  01/30/2017 chest radiograph.  11/27/2006 chest CT. FINDINGS: Please see the separate concurrent chest CT angiogram report for details. IMPRESSION: Please see the separate concurrent chest CT angiogram report for details. Electronically Signed: By:  Ilona Sorrel M.D. On: 10/02/2017 14:29   Ct Angio Chest Aorta W &/or Wo Contrast  Result Date: 10/02/2017 CLINICAL DATA:  Severe symptomatic aortic stenosis. TAVR evaluation. EXAM: CT ANGIOGRAPHY CHEST, ABDOMEN AND PELVIS TECHNIQUE: Multidetector CT imaging through the chest, abdomen and pelvis was performed using the standard protocol during bolus administration of intravenous contrast. Multiplanar reconstructed images and MIPs were obtained and reviewed to evaluate the vascular anatomy. CONTRAST:  162mL ISOVUE-370 IOPAMIDOL (ISOVUE-370) INJECTION 76% COMPARISON:  11/27/2006 chest CT. FINDINGS: CTA CHEST FINDINGS Cardiovascular: Top-normal heart size. No significant pericardial effusion/thickening. Severe thickening and calcification of the aortic valve. Three-vessel coronary atherosclerosis. Atherosclerotic  nonaneurysmal thoracic aorta. Normal caliber pulmonary arteries. No central pulmonary emboli. Mediastinum/Nodes: No discrete thyroid nodules. Unremarkable esophagus. No pathologically enlarged axillary, mediastinal or hilar lymph nodes. Simple 1.5 x 1.0 cm right anterior mediastinal cystic structure (series 6/image 39), new since 11/27/2006 chest CT. Lungs/Pleura: No pneumothorax. No pleural effusion. Mild-to-moderate centrilobular emphysema with mild diffuse bronchial wall thickening. There are ground-glass pulmonary nodules in the right upper lobe measuring 1.8 cm (series 7/image 24) and 1.7 cm (series 7/image 40), not definitely seen on the 2008 chest CT. No acute consolidative airspace disease, lung masses or additional significant pulmonary nodules. Musculoskeletal: No aggressive appearing focal osseous lesions. Moderate thoracic spondylosis. CTA ABDOMEN AND PELVIS FINDINGS Hepatobiliary: Normal liver size. Scattered tiny granulomatous liver calcifications. No liver masses. Normal gallbladder with no radiopaque cholelithiasis. No biliary ductal dilatation. Pancreas: Normal, with no mass or duct  dilation. Spleen: Normal size. No mass. Adrenals/Urinary Tract: Normal adrenals. Scattered subcentimeter hypodense renal cortical lesions in both kidneys, too small to characterize, which require no follow-up. No hydronephrosis. Normal bladder. Stomach/Bowel: Normal non-distended stomach. Normal caliber small bowel with no small bowel wall thickening. Appendectomy. Mild sigmoid diverticulosis. No large bowel wall thickening or significant pericolonic fat stranding. Moderate colonic stool volume. Vascular/Lymphatic: Atherosclerotic nonaneurysmal abdominal aorta. Patent portal, splenic and renal veins. No pathologically enlarged lymph nodes in the abdomen or pelvis. Reproductive: Mildly enlarged prostate. Other: No pneumoperitoneum, ascites or focal fluid collection. Musculoskeletal: No aggressive appearing focal osseous lesions. Moderate lumbar spondylosis. VASCULAR MEASUREMENTS PERTINENT TO TAVR: AORTA: Minimal Aortic Diameter-17.2 x 13.9 mm (infrarenal abdominal aorta on series 6/image 134) Severity of Aortic Calcification-moderate RIGHT PELVIS: Right Common Iliac Artery - Minimal Diameter-8.7 x 7.1 mm Tortuosity-mild Calcification-moderate Right External Iliac Artery - Minimal Diameter-6.5 x 6.2 mm Tortuosity-mild Calcification-mild Right Common Femoral Artery - Minimal Diameter-7.2 x 5.7 mm Tortuosity-mild Calcification-moderate LEFT PELVIS: Left Common Iliac Artery - Minimal Diameter-9.3 x 7.4 mm Tortuosity-mild Calcification-moderate to severe Left External Iliac Artery - Minimal Diameter-7.1 x 5.0 mm Tortuosity-mild-to-moderate Calcification-mild Left Common Femoral Artery - Minimal Diameter-5.7 x 5.5 mm Tortuosity-mild Calcification-moderate Review of the MIP images confirms the above findings. IMPRESSION: 1. Vascular findings and measurements pertinent to potential TAVR procedure, as detailed above. 2. Severe thickening and calcification of the aortic valve, compatible with the provided clinical history of  severe symptomatic aortic stenosis. 3. Two right upper lobe ground-glass pulmonary nodules, largest 1.8 cm. Non-contrast chest CT at 3-6 months is recommended. If nodules persist, subsequent management will be based upon the most suspicious nodule(s). This recommendation follows the consensus statement: Guidelines for Management of Incidental Pulmonary Nodules Detected on CT Images:From the Fleischner Society 2017; published online before print (10.1148/radiol.[id number]). 4. Small simple 1.5 cm cystic structure in the right anterior mediastinum. Differential includes pericardial cyst, benign thymic cyst or cystic thymoma. This structure can also be reassessed on the follow-up chest CT in 3-6 months. 5. Three-vessel coronary atherosclerosis. 6. Aortic Atherosclerosis (ICD10-I70.0) and Emphysema (ICD10-J43.9). 7. Mildly enlarged prostate. 8. Mild sigmoid diverticulosis. Electronically Signed   By: Ilona Sorrel M.D.   On: 10/02/2017 14:28   Ct Angio Abd/pel W/ And/or W/o  Result Date: 10/02/2017 CLINICAL DATA:  Severe symptomatic aortic stenosis. TAVR evaluation. EXAM: CT ANGIOGRAPHY CHEST, ABDOMEN AND PELVIS TECHNIQUE: Multidetector CT imaging through the chest, abdomen and pelvis was performed using the standard protocol during bolus administration of intravenous contrast. Multiplanar reconstructed images and MIPs were obtained and reviewed to evaluate the vascular anatomy. CONTRAST:  171mL ISOVUE-370 IOPAMIDOL (ISOVUE-370) INJECTION 76% COMPARISON:  11/27/2006  chest CT. FINDINGS: CTA CHEST FINDINGS Cardiovascular: Top-normal heart size. No significant pericardial effusion/thickening. Severe thickening and calcification of the aortic valve. Three-vessel coronary atherosclerosis. Atherosclerotic nonaneurysmal thoracic aorta. Normal caliber pulmonary arteries. No central pulmonary emboli. Mediastinum/Nodes: No discrete thyroid nodules. Unremarkable esophagus. No pathologically enlarged axillary, mediastinal or  hilar lymph nodes. Simple 1.5 x 1.0 cm right anterior mediastinal cystic structure (series 6/image 39), new since 11/27/2006 chest CT. Lungs/Pleura: No pneumothorax. No pleural effusion. Mild-to-moderate centrilobular emphysema with mild diffuse bronchial wall thickening. There are ground-glass pulmonary nodules in the right upper lobe measuring 1.8 cm (series 7/image 24) and 1.7 cm (series 7/image 40), not definitely seen on the 2008 chest CT. No acute consolidative airspace disease, lung masses or additional significant pulmonary nodules. Musculoskeletal: No aggressive appearing focal osseous lesions. Moderate thoracic spondylosis. CTA ABDOMEN AND PELVIS FINDINGS Hepatobiliary: Normal liver size. Scattered tiny granulomatous liver calcifications. No liver masses. Normal gallbladder with no radiopaque cholelithiasis. No biliary ductal dilatation. Pancreas: Normal, with no mass or duct dilation. Spleen: Normal size. No mass. Adrenals/Urinary Tract: Normal adrenals. Scattered subcentimeter hypodense renal cortical lesions in both kidneys, too small to characterize, which require no follow-up. No hydronephrosis. Normal bladder. Stomach/Bowel: Normal non-distended stomach. Normal caliber small bowel with no small bowel wall thickening. Appendectomy. Mild sigmoid diverticulosis. No large bowel wall thickening or significant pericolonic fat stranding. Moderate colonic stool volume. Vascular/Lymphatic: Atherosclerotic nonaneurysmal abdominal aorta. Patent portal, splenic and renal veins. No pathologically enlarged lymph nodes in the abdomen or pelvis. Reproductive: Mildly enlarged prostate. Other: No pneumoperitoneum, ascites or focal fluid collection. Musculoskeletal: No aggressive appearing focal osseous lesions. Moderate lumbar spondylosis. VASCULAR MEASUREMENTS PERTINENT TO TAVR: AORTA: Minimal Aortic Diameter-17.2 x 13.9 mm (infrarenal abdominal aorta on series 6/image 134) Severity of Aortic Calcification-moderate  RIGHT PELVIS: Right Common Iliac Artery - Minimal Diameter-8.7 x 7.1 mm Tortuosity-mild Calcification-moderate Right External Iliac Artery - Minimal Diameter-6.5 x 6.2 mm Tortuosity-mild Calcification-mild Right Common Femoral Artery - Minimal Diameter-7.2 x 5.7 mm Tortuosity-mild Calcification-moderate LEFT PELVIS: Left Common Iliac Artery - Minimal Diameter-9.3 x 7.4 mm Tortuosity-mild Calcification-moderate to severe Left External Iliac Artery - Minimal Diameter-7.1 x 5.0 mm Tortuosity-mild-to-moderate Calcification-mild Left Common Femoral Artery - Minimal Diameter-5.7 x 5.5 mm Tortuosity-mild Calcification-moderate Review of the MIP images confirms the above findings. IMPRESSION: 1. Vascular findings and measurements pertinent to potential TAVR procedure, as detailed above. 2. Severe thickening and calcification of the aortic valve, compatible with the provided clinical history of severe symptomatic aortic stenosis. 3. Two right upper lobe ground-glass pulmonary nodules, largest 1.8 cm. Non-contrast chest CT at 3-6 months is recommended. If nodules persist, subsequent management will be based upon the most suspicious nodule(s). This recommendation follows the consensus statement: Guidelines for Management of Incidental Pulmonary Nodules Detected on CT Images:From the Fleischner Society 2017; published online before print (10.1148/radiol.[id number]). 4. Small simple 1.5 cm cystic structure in the right anterior mediastinum. Differential includes pericardial cyst, benign thymic cyst or cystic thymoma. This structure can also be reassessed on the follow-up chest CT in 3-6 months. 5. Three-vessel coronary atherosclerosis. 6. Aortic Atherosclerosis (ICD10-I70.0) and Emphysema (ICD10-J43.9). 7. Mildly enlarged prostate. 8. Mild sigmoid diverticulosis. Electronically Signed   By: Ilona Sorrel M.D.   On: 10/02/2017 14:28    Cardiac Studies   Cath 10/2  Ost LAD to Prox LAD lesion is 30% stenosed.  Mid LAD-1  lesion is 60% stenosed.  Mid LAD-2 lesion is 70% stenosed.  Prox Cx to Mid Cx lesion is 100% stenosed.  Balloon angioplasty was performed  using a BALLOON SAPPHIRE 2.0X12.  Post intervention, there is a 25% residual stenosis.  Ost Cx to Prox Cx lesion is 80% stenosed.  Mid RCA to Dist RCA lesion is 60% stenosed.  Mid RCA lesion is 70% stenosed.  RPDA lesion is 99% stenosed.  Lat 2nd Mrg lesion is 90% stenosed.  2nd Mrg lesion is 80% stenosed.  ECHO today  Patient Profile     77 y.o. male p/w chest pain and STEMI per EMS with Q-wave on arrival in [facility] after finding son dead at his home. Balloon Angio of Prox-Mid Cx lesion, improved from 100% to 15%. AVR/CABG had been planned for 10/08/17, moved up to 10/03/17.  Assessment & Plan    1) STEMI: Presented following STEMI on EMS ECG after their arrival for his son's death (DOA). S/P baloon angioplasty of prox-mid Cx lesion. Severe multi-vessel disease; previously schedule for AVR/CABG on 10/08/17, moved up to today. - CABG/AVR today - ASA; On tirofibran, Start Plavis following surgery - Atorvastatin 80mg , Metoprolol 12.5mg  BID  2) AS: History of mod-severe AS; AVR/CABG previosuly scheduled for 10/08/17, moved up to today given acute MI. - CABG/AVR today  3) HTN: BP stable this AM, Home Lisinopril held 4) HLD: Home Simvastatin 40mg  has been switched to Atorvastatin 80mg  5) DM2: A1c 8.5, increased from previous. On Metformin and Glipizide. SSI inpatient, will need better control for risk reduction.  For questions or updates, please contact [place] Please consult [email or web address] for contact info under        Signed, Neva Seat, MD  10/03/2017, 9:59 AM    I have examined the patient and reviewed assessment and plan and discussed with patient.  Agree with above as stated.  RIght radial site intact.  For surgery today. Start DAPT when safe from a bleeding standpoint postoperatively.   Larae Grooms

## 2017-10-03 NOTE — Anesthesia Postprocedure Evaluation (Signed)
Anesthesia Post Note  Patient: Erik Keith  Procedure(s) Performed: AORTIC VALVE REPLACEMENT (AVR) (N/A Chest) CORONARY ARTERY BYPASS GRAFTING (CABG)x three, USING LEFT INTERNAL MAMMARY ARTERY AND RIGHT GREATER SAPHENOUS VEIN HARVESTED ENDOSCOPICALLY (N/A Chest)     Patient location during evaluation: SICU Anesthesia Type: General Level of consciousness: sedated Pain management: pain level controlled Vital Signs Assessment: post-procedure vital signs reviewed and stable Respiratory status: patient remains intubated per anesthesia plan Cardiovascular status: stable Postop Assessment: no apparent nausea or vomiting Anesthetic complications: no    Last Vitals:  Vitals:   10/03/17 0900 10/03/17 1708  BP: (!) 124/59 (!) 99/51  Pulse:  80  Resp: (!) 23 12  Temp:    SpO2:  95%    Last Pain:  Vitals:   10/03/17 0800  TempSrc:   PainSc: 0-No pain                 Tiajuana Amass

## 2017-10-03 NOTE — Brief Op Note (Signed)
10/02/2017 - 10/03/2017  4:44 PM  PATIENT:  Erik Keith  77 y.o. male  PRE-OPERATIVE DIAGNOSIS:  aortic stenosis and coronary artery disease  POST-OPERATIVE DIAGNOSIS:  aortic stenosis and coronary artery disease  PROCEDURE:  Procedure(s):  AORTIC VALVE REPLACEMENT  -23 mm Edwards Inspiris Resilia Bioprosthetic Valve  CORONARY ARTERY BYPASS GRAFTING x 4 -Left Internal Mammary Artery to Left Anterior Descending -Sequential Saphenous Vein Graft to Obtuse Marginal 1 and 2 -Saphenous Vein Graft to Posterior Descending Artery of the Right Coronary Artery  ENDOSCOPIC HARVEST GREATER SAPHENOUS VEIN -Right Leg  SURGEON:  Surgeon(s) and Role:    Rexene Alberts, MD - Primary  PHYSICIAN ASSISTANT: Ellwood Handler PA-C  ANESTHESIA:   general  EBL:  1000 mL   BLOOD ADMINISTERED: CELLSAVER  DRAINS: Left Pleural Chest Tubes, Mediastinal Chest Drains   LOCAL MEDICATIONS USED:  NONE  SPECIMEN:  No Specimen  DISPOSITION OF SPECIMEN:  N/A  COUNTS:  YES  TOURNIQUET:  * No tourniquets in log *  DICTATION: .Dragon Dictation  PLAN OF CARE: Admit to inpatient   PATIENT DISPOSITION:  ICU - intubated and hemodynamically stable.   Delay start of Pharmacological VTE agent (>24hrs) due to surgical blood loss or risk of bleeding: yes

## 2017-10-03 NOTE — Progress Notes (Signed)
RT NOTE:  Cardiac Rapid Wean initiated. Pt follows commands.  

## 2017-10-03 NOTE — Anesthesia Preprocedure Evaluation (Signed)
Anesthesia Evaluation  Patient identified by MRN, date of birth, ID band Patient awake    Reviewed: Allergy & Precautions, NPO status , Patient's Chart, lab work & pertinent test results, reviewed documented beta blocker date and time   Airway Mallampati: II  TM Distance: >3 FB Neck ROM: Full    Dental  (+) Dental Advisory Given, Edentulous Upper, Edentulous Lower   Pulmonary former smoker,    breath sounds clear to auscultation       Cardiovascular hypertension, Pt. on medications and Pt. on home beta blockers + angina + CAD and + Past MI  + Valvular Problems/Murmurs AS  Rhythm:Regular Rate:Normal + Systolic murmurs    Neuro/Psych negative neurological ROS     GI/Hepatic Neg liver ROS, GERD  ,  Endo/Other  diabetes, Type 2  Renal/GU negative Renal ROS     Musculoskeletal  (+) Arthritis ,   Abdominal   Peds  Hematology negative hematology ROS (+)   Anesthesia Other Findings   Reproductive/Obstetrics                             Lab Results  Component Value Date   WBC 15.7 (H) 10/03/2017   HGB 13.3 10/03/2017   HCT 39.6 10/03/2017   MCV 97.5 10/03/2017   PLT 223 10/03/2017   Lab Results  Component Value Date   CREATININE 1.14 10/03/2017   BUN 33 (H) 10/03/2017   NA 139 10/03/2017   K 4.4 10/03/2017   CL 108 10/03/2017   CO2 22 10/03/2017    Anesthesia Physical Anesthesia Plan  ASA: IV  Anesthesia Plan: General   Post-op Pain Management:    Induction: Intravenous  PONV Risk Score and Plan: 2 and Ondansetron, Dexamethasone and Treatment [place] vary due to age or medical condition  Airway Management Planned: Oral ETT  Additional Equipment: Arterial line, CVP, PA Cath, TEE and Ultrasound Guidance Line Placement  Intra-op Plan:   Post-operative Plan: Post-operative intubation/ventilation  Informed Consent: I have reviewed the patients History and Physical, chart, labs  and discussed the procedure including the risks, benefits and alternatives for the proposed anesthesia with the patient or authorized representative who has indicated his/her understanding and acceptance.   Dental advisory given  Plan Discussed with: CRNA  Anesthesia Plan Comments:         Anesthesia Quick Evaluation

## 2017-10-03 NOTE — Transfer of Care (Signed)
Immediate Anesthesia Transfer of Care Note  Patient: Erik Keith  Procedure(s) Performed: AORTIC VALVE REPLACEMENT (AVR) (N/A Chest) CORONARY ARTERY BYPASS GRAFTING (CABG)x three, USING LEFT INTERNAL MAMMARY ARTERY AND RIGHT GREATER SAPHENOUS VEIN HARVESTED ENDOSCOPICALLY (N/A Chest)  Patient Location: PACU  Anesthesia Type:General  Level of Consciousness: Patient remains intubated per anesthesia plan  Airway & Oxygen Therapy: Patient remains intubated per anesthesia plan and Patient placed on Ventilator (see vital sign flow sheet for setting)  Post-op Assessment: Report given to RN  Post vital signs: Reviewed and stable  Last Vitals:  Vitals Value Taken Time  BP 111/77 10/03/2017  5:13 PM  Temp 36.3 C 10/03/2017  5:19 PM  Pulse 80 10/03/2017  5:19 PM  Resp 12 10/03/2017  5:19 PM  SpO2 97 % 10/03/2017  5:19 PM  Vitals shown include unvalidated device data.  Last Pain:  Vitals:   10/03/17 0800  TempSrc:   PainSc: 0-No pain      Patients Stated Pain Goal: 1 (07/37/10 6269)  Complications: No apparent anesthesia complications

## 2017-10-03 NOTE — Progress Notes (Signed)
CT surgery p.m. Rounds  Patient intubated on ventilator with stable hemodynamics Chest tube output remains 100/h.  INR 1.6 and FFP infusing with platelets to follow Postop hematocrit 29%

## 2017-10-03 NOTE — Consult Note (Signed)
[address]       [place] [address]             [phone]          CARDIOTHORACIC SURGERY CONSULTATION REPORT  PCP is Raina Mina., MD  Primary Cardiologist is Burnell Blanks, MD Referring Provider is Jettie Booze, MD  Reason for consultation:  Severe aortic stenosis and CAD s/p acute STEMI  HPI:  Patient is a 77 year old male with history of coronary artery disease status post PCI x2 in 2008, aortic stenosis, hypertension, hyperlipidemia, type 2 diabetes mellitus, iron deficiency anemia, GE reflux disease, and arthritis of the left shoulder who was recently evaluated for management of severe three-vessel coronary artery disease and severe aortic stenosis.  The patient was seen in consultation on September 20, 2017 and plans were made for elective aortic valve replacement and coronary artery bypass grafting next week.  Patient underwent routine preoperative testing yesterday.  Yesterday evening the patient went home and found his son laying dead on the living room couch.  EMS was called but the patient's son was pronounced dead on arrival.  At that time the patient suddenly developed substernal chest pain and an EKG revealed inferior wall ST elevation consistent with ST segment elevation myocardial infarction.  Patient was brought directly to the Cath Lab where angiography revealed 99% subtotal occlusion of the left circumflex coronary artery with TIMI I flow.  The patient was treated with balloon angioplasty without stent placement and symptoms resolved.  Cardiothoracic surgical consultation was requested.  The patient reports that his chest pain and chest tightness completely resolved after his trip to the Cath Lab.  He is understandably distraught regarding his son's death.  He denies shortness of breath.  He reports no other significant problems and the remainder of his review of systems is unchanged.   Past Medical History:  Diagnosis Date    . Aortic valve stenosis, severe   . CAD S/P percutaneous coronary angioplasty    12-2006  STENT TO LAD;  07-2007 DEStenting TO RCA   . GERD (gastroesophageal reflux disease)   . Hypertension   . Incidental pulmonary nodule, less than or equal to 8m 10/02/2017   Ground glass opacities RUL seen on CTA  . Iron deficiency anemia   . Mixed hyperlipidemia   . OA (osteoarthritis) of shoulder    LEFT SHOULDER AND AC JOINT  . Type 2 diabetes mellitus ([place]     Past Surgical History:  Procedure Laterality Date  . APPENDECTOMY    . BACK SURGERY  x3  . CORONARY/GRAFT ACUTE MI REVASCULARIZATION N/A 10/02/2017   Procedure: Coronary/Graft Acute MI Revascularization;  Surgeon: VJettie Booze MD;  [facility];  Service: Cardiovascular;  Laterality: N/A;  . LEFT HEART CATH AND CORONARY ANGIOGRAPHY N/A 10/02/2017   Procedure: LEFT HEART CATH AND CORONARY ANGIOGRAPHY;  Surgeon: VJettie Booze MD;  [facility];  Service: Cardiovascular;  Laterality: N/A;  . RIGHT/LEFT HEART CATH AND CORONARY ANGIOGRAPHY N/A 09/12/2017   Procedure: RIGHT/LEFT HEART CATH AND CORONARY ANGIOGRAPHY;  Surgeon: MBurnell Blanks MD;  [facility];  Service: Cardiovascular;  Laterality: N/A;  . TONSILLECTOMY      Family History  Problem Relation Age of Onset  . CVA Father   . Heart attack Sister   . Heart attack Brother        3 brothers with CAD    Social  History   Socioeconomic History  . Marital status: Married    Spouse name: Lenell Antu  . Number of children: 2  . Years of education: Not on file  . Highest education level: Not on file  Occupational History  . Occupation: Retired-worked at [employer]  . Financial resource strain: Not on file  . Food insecurity:    Worry: Not on file    Inability: Not on file  . Transportation needs:    Medical: Not on file    Non-medical: Not on file  Tobacco Use  . Smoking status: Former  Smoker    Years: 40.00    Last attempt to quit: 2002    Years since quitting: 17.7  . Smokeless tobacco: Never Used  Substance and Sexual Activity  . Alcohol use: Never    Frequency: Never  . Drug use: Never  . Sexual activity: Not on file  Lifestyle  . Physical activity:    Days per week: Not on file    Minutes per session: Not on file  . Stress: Not on file  Relationships  . Social connections:    Talks on phone: Not on file    Gets together: Not on file    Attends religious service: Not on file    Active member of club or organization: Not on file    Attends meetings of clubs or organizations: Not on file    Relationship status: Not on file  . Intimate partner violence:    Fear of current or ex partner: Not on file    Emotionally abused: Not on file    Physically abused: Not on file    Forced sexual activity: Not on file  Other Topics Concern  . Not on file  Social History Narrative  . Not on file    Prior to Admission medications   Medication Sig Start Date End Date Taking? Authorizing Provider  aspirin EC 81 MG tablet Take 81 mg by mouth daily.    [provider]  b complex vitamins tablet Take 1 tablet by mouth 4 (four) times a week.     [provider]  ferrous sulfate 324 (65 Fe) MG TBEC Take 325 mg by mouth daily.     [provider]  gabapentin (NEURONTIN) 300 MG capsule Take 600 mg by mouth at bedtime.     [provider]  glipiZIDE (GLUCOTROL XL) 5 MG 24 hr tablet Take 5 mg by mouth 2 (two) times daily.    [provider]  Glucos-Chond-Hyal Ac-Ca Fructo (MOVE FREE JOINT HEALTH ADVANCE PO) Take 1 tablet by mouth 3 (three) times a week.     [provider]  HYDROcodone-acetaminophen (NORCO) 10-325 MG tablet Take 1 tablet by mouth 2 (two) times daily.     [provider]  metFORMIN (GLUCOPHAGE) 500 MG tablet Take 500 mg by mouth 2 (two) times daily.     [provider]  naproxen sodium (ALEVE)  220 MG tablet Take 220-440 mg by mouth 2 (two) times daily as needed (FOR PAIN.).    [provider]  nitroGLYCERIN (NITROSTAT) 0.4 MG SL tablet Place 0.4 mg under the tongue every 5 (five) minutes as needed for chest pain.  07/12/17   [provider]  pantoprazole (PROTONIX) 40 MG tablet Take 40 mg by mouth at bedtime.     [provider]  Polyethylene Glycol 3350 (MIRALAX PO) Take 17 g by mouth daily. WITH COFFEE    [provider]  predniSONE (DELTASONE) 50 MG tablet Take Prednisone 50 mg 13 hours prior to CT Scan, then Take Prednisone 50 mg 7 hours prior to CT Scan then Take Prednisone 50 mg and Benadryl 50 mg 1 hour prior to CT scan 09/20/17   Rexene Alberts, MD  ramipril (ALTACE) 5 MG capsule Take 5 mg by mouth 2 (two) times daily.    [provider]  simvastatin (ZOCOR) 40 MG tablet Take 40 mg by mouth every evening.     [provider]  zolpidem (AMBIEN) 10 MG tablet Take 10 mg by mouth at bedtime as needed for sleep.    [provider]    Current Facility-Administered Medications  Medication Dose Route Frequency Provider Last Rate Last Dose  . 0.9 %  sodium chloride infusion  250 mL Intravenous PRN Jettie Booze, MD      . acetaminophen (TYLENOL) tablet 650 mg  650 mg Oral Q4H PRN Jettie Booze, MD      . aspirin EC tablet 81 mg  81 mg Oral Daily Jettie Booze, MD      . atorvastatin (LIPITOR) tablet 80 mg  80 mg Oral q1800 Jettie Booze, MD      . gabapentin (NEURONTIN) capsule 600 mg  600 mg Oral QHS Jettie Booze, MD   600 mg at 10/03/17 0206  . glipiZIDE (GLUCOTROL XL) 24 hr tablet 5 mg  5 mg Oral BID Jettie Booze, MD      . HYDROcodone-acetaminophen [facility]) 10-325 MG per tablet 1 tablet  1 tablet Oral BID Jettie Booze, MD   1 tablet at 10/03/17 0205  . insulin aspart (novoLOG) injection 0-15 Units  0-15 Units Subcutaneous TID WC Magda Paganini A, MD      . metoprolol  tartrate (LOPRESSOR) tablet 12.5 mg  12.5 mg Oral BID Jettie Booze, MD   12.5 mg at 10/03/17 0205  . nitroGLYCERIN (NITROSTAT) SL tablet 0.4 mg  0.4 mg Sublingual Q5 min PRN Jettie Booze, MD      . ondansetron [facility]) injection 4 mg  4 mg Intravenous Q6H PRN Jettie Booze, MD      . pantoprazole (PROTONIX) EC tablet 40 mg  40 mg Oral QHS Jettie Booze, MD   40 mg at 10/03/17 0206  . sodium chloride flush (NS) 0.9 % injection 3 mL  3 mL Intravenous Q12H Jettie Booze, MD   3 mL at 10/03/17 0430  . sodium chloride flush (NS) 0.9 % injection 3 mL  3 mL Intravenous PRN Jettie Booze, MD      . tirofiban (AGGRASTAT) infusion 50 mcg/mL 100 mL  0.15 mcg/kg/min Intravenous Continuous Jettie Booze, MD 16.52 mL/hr at 10/03/17 0627 0.15 mcg/kg/min at 10/03/17 1224  . zolpidem (AMBIEN) tablet 5 mg  5 mg Oral QHS PRN Jettie Booze, MD        Allergies  Allergen Reactions  . Contrast Media [Iodinated Diagnostic Agents] Hives  . Penicillins Hives    Has patient had a PCN reaction causing immediate rash, facial/tongue/throat swelling, SOB or lightheadedness with hypotension: unkn Has patient had a PCN reaction causing severe rash involving mucus membranes or skin necrosis: unkn Has patient had a PCN reaction that required hospitalization: unkn Has patient had a PCN reaction occurring within the last 10 years: no If all of the above answers are "NO", then [place] proceed with Cephalosporin use.       Review of  Systems:  Unchanged from previously with exception of acute grief in association of circumstances    Physical Exam:   BP 112/68   Pulse 63   Temp 97.7 F (36.5 C) (Oral)   Resp 17   Ht '5\' 11"'  (1.803 m)   Wt 97.9 kg   SpO2 (!) 88%   BMI 30.10 kg/m   General:  Upset but otherwise well-appearing  HEENT:  Unremarkable   Neck:   no JVD, no bruits, no adenopathy   Chest:   clear to auscultation, symmetrical breath sounds, no wheezes,  no rhonchi   CV:   RRR, grade III/VI systolic murmur murmur   Abdomen:  soft, non-tender, no masses   Extremities:  warm, well-perfused, pulses palpable, no lower extremity edema  Rectal/GU  Deferred  Neuro:   Grossly non-focal and symmetrical throughout  Skin:   Clean and dry, no rashes, no breakdown  Diagnostic Tests:  Lab Results: Recent Labs    10/02/17 1422  WBC 16.1*  HGB 14.9  HCT 45.0  PLT 146*   BMET:  Recent Labs    10/02/17 1422  NA 135  K 4.0  CL 103  CO2 14*  GLUCOSE 345*  BUN 19  CREATININE 0.82  CALCIUM 9.4    CBG (last 3)  Recent Labs    10/02/17 1353  GLUCAP 399*   PT/INR:   Recent Labs    10/02/17 1422  LABPROT 14.8  INR 1.17    CXR:  CHEST - 2 VIEW  COMPARISON:  Chest x-ray dated 01/30/2017.  FINDINGS: The heart size and mediastinal contours are within normal limits. Both lungs are clear. No acute or suspicious osseous finding.  IMPRESSION: No active cardiopulmonary disease.   Electronically Signed   By: Franki Cabot M.D.   On: 10/02/2017 20:11   Coronary/Graft Acute MI Revascularization  LEFT HEART CATH AND CORONARY ANGIOGRAPHY  Conclusion     Ost LAD to Prox LAD lesion is 30% stenosed.  Mid LAD-1 lesion is 60% stenosed.  Mid LAD-2 lesion is 70% stenosed.  Prox Cx to Mid Cx lesion is 100% stenosed.  Balloon angioplasty was performed using a BALLOON SAPPHIRE 2.0X12.  Post intervention, there is a 25% residual stenosis.  Ost Cx to Prox Cx lesion is 80% stenosed.  Mid RCA to Dist RCA lesion is 60% stenosed.  Mid RCA lesion is 70% stenosed.  RPDA lesion is 99% stenosed.  Lat 2nd Mrg lesion is 90% stenosed.  2nd Mrg lesion is 80% stenosed.     Recommend uninterrupted dual antiplatelet therapy with Aspirin 65m daily and Clopidogrel 737mdaily for a minimum of 12 months (ACS - Class I recommendation).   DAPT can start after CABG.  For now, will continue IV tirofiban for 18 hours. Depending on if  the timing of surgery is delayed from 10/8, could start Plavix and stop 5 days prior to surgery.  Will discuss with Dr. OwRoxy Mannsn AM.   Increase statin potency.  Start beta blocker.  Indications   Acute inferoposterior myocardial infarction (H[place][I21.19 (ICD-10-CM)]  Procedural Details/Technique   Technical Details The risks, benefits, and details of the procedure were explained to the patient. The patient verbalized understanding and wanted to proceed. Informed written consent was obtained.  PROCEDURE TECHNIQUE: After Xylocaine anesthesia a 75F slender sheath was placed in the right radial artery with a single anterior needle wall stick. IV Heparin was given. Right coronary angiography was done using a Judkins R4 guide catheter. Left coronary angiography was  done using a Judkins L3.5 guide catheter. Left ventriculography was not done. A TR band was used for hemostasis.  IV heparin and tirofiban for anticoagulation. EBU 3.5 guide. THere was difficulty seating the guide Catheter and wiring the lesion, which caused a delay in reperfusion.  Contrast: 175 cc     Estimated blood loss <50 mL.  During this procedure the patient was administered the following to achieve and maintain moderate conscious sedation: Versed 2 mg, Fentanyl 50 mcg, while the patient's heart rate, blood pressure, and oxygen saturation were continuously monitored. The period of conscious sedation was 55 minutes, of which I was present face-to-face 100% of this time.  Complications   Complications documented before study signed (10/02/2017 11:56 PM EDT)    No complications were associated with this study.  Documented by Jettie Booze, MD - 10/02/2017 11:34 PM EDT    Coronary Findings   Diagnostic  Dominance: Right  Left Anterior Descending  Vessel is large.  Ost LAD to Prox LAD lesion 30% stenosed  Ost LAD to Prox LAD lesion is 30% stenosed. The lesion was previously treated using a stent (unknown type) over 2  years ago.  Mid LAD-1 lesion 60% stenosed  Mid LAD-1 lesion is 60% stenosed.  Mid LAD-2 lesion 70% stenosed  Mid LAD-2 lesion is 70% stenosed.  Left Circumflex  Ost Cx to Prox Cx lesion 80% stenosed  Ost Cx to Prox Cx lesion is 80% stenosed. The lesion is calcified.  Prox Cx to Mid Cx lesion 100% stenosed  Prox Cx to Mid Cx lesion is 100% stenosed.  First Obtuse Marginal Branch  Vessel is large in size.  Second Obtuse Marginal Branch  Vessel is moderate in size.  2nd Mrg lesion 80% stenosed  2nd Mrg lesion is 80% stenosed.  Lateral Second Obtuse Marginal Branch  Lat 2nd Mrg lesion 90% stenosed  Lat 2nd Mrg lesion is 90% stenosed.  Third Obtuse Marginal Branch  Vessel is moderate in size.  Right Coronary Artery  Mid RCA lesion 70% stenosed  Mid RCA lesion is 70% stenosed.  Mid RCA to Dist RCA lesion 60% stenosed  Mid RCA to Dist RCA lesion is 60% stenosed. The lesion was previously treated using a stent (unknown type) over 2 years ago.  Right Posterior Descending Artery  RPDA lesion 99% stenosed  RPDA lesion is 99% stenosed.  Intervention   Prox Cx to Mid Cx lesion  Angioplasty  WIRE FIGHTER CROSSING guidewire used to cross lesion. Balloon angioplasty was performed using a BALLOON SAPPHIRE 2.0X12. Maximum pressure: 8 atm. 1.0 mm balloon was needed to cross the lesion. THis balloon ruptured after several inflations.  Post-Intervention Lesion Assessment  The intervention was successful. Pre-interventional TIMI flow is 1. Post-intervention TIMI flow is 3. No complications occurred at this lesion.  There is a 25% residual stenosis post intervention.  Coronary Diagrams   Diagnostic Diagram       Post-Intervention Diagram           Impression:  Patient has severe aortic stenosis and severe three-vessel coronary artery disease.  He presented with acute ST segment elevation myocardial infarction yesterday evening with subtotal occlusion of the left circumflex coronary  artery.  This was successfully temporized using primary balloon angioplasty without stent placement.  Definitive management of the patient's left circumflex territory is not favorable for PCI, and I agree the patient would best be treated with surgical aortic valve replacement and coronary artery bypass grafting.  The patient appears to be doing  quite well at this time with no significant complications related to his acute myocardial infarction.  At this point there is no reason to delay definitive surgery, and given the patient's understandably upsetting personal circumstances it probably makes sense to proceed with surgery as soon as possible.   Plan:  I have discussed the timing of surgical intervention with the patient.  He desires to proceed with surgery as soon as possible.  We again discussed the indications, risk, and potential benefits of aortic valve replacement and coronary artery bypass grafting.  He understands and accepts all associated risks of surgery.  All of his questions have been answered.  We will proceed with surgery later this morning.   I spent in excess of 60 minutes during the conduct of this hospital consultation and >50% of this time involved direct face-to-face encounter for counseling and/or coordination of the patient's care.    Valentina Gu. Roxy Manns, MD 10/03/2017 6:43 AM

## 2017-10-03 NOTE — Anesthesia Procedure Notes (Signed)
Central Venous Catheter Insertion Performed by: Albertha Ghee, MD, anesthesiologist Start/End10/03/2017 10:11 AM, 10/03/2017 10:24 AM Patient location: Pre-op. Preanesthetic checklist: patient identified, IV checked, site marked, risks and benefits discussed, surgical consent, monitors and equipment checked, pre-op evaluation, timeout performed and anesthesia consent Position: Trendelenburg Lidocaine 1% used for infiltration and patient sedated Hand hygiene performed , maximum sterile barriers used  and Seldinger technique used Catheter size: 9 Fr Central line and PA cath was placed.MAC introducer Swan type:thermodilation Procedure performed using ultrasound guided technique. Ultrasound Notes:anatomy identified, needle tip was noted to be adjacent to the nerve/plexus identified, no ultrasound evidence of intravascular and/or intraneural injection and image(s) printed for medical record Attempts: 1 Following insertion, line sutured, dressing applied and Biopatch. Post procedure assessment: blood return through all ports, free fluid flow and no air  Patient tolerated the procedure well with no immediate complications.

## 2017-10-03 NOTE — Anesthesia Procedure Notes (Signed)
Procedure Name: Intubation Date/Time: 10/03/2017 11:20 AM Performed by: Inda Coke, CRNA Pre-anesthesia Checklist: Patient identified, Emergency Drugs available, Suction available and Patient being monitored Patient Re-evaluated:Patient Re-evaluated prior to induction Oxygen Delivery Method: Circle System Utilized Preoxygenation: Pre-oxygenation with 100% oxygen Induction Type: IV induction Ventilation: Mask ventilation without difficulty and Oral airway inserted - appropriate to patient size Laryngoscope Size: Mac and 4 Grade View: Grade I Tube type: Oral Tube size: 8.0 mm Number of attempts: 1 Airway Equipment and Method: Stylet and Oral airway Placement Confirmation: ETT inserted through vocal cords under direct vision,  positive ETCO2 and breath sounds checked- equal and bilateral Secured at: 23 cm Tube secured with: Tape Dental Injury: Teeth and Oropharynx as per pre-operative assessment

## 2017-10-03 NOTE — Anesthesia Procedure Notes (Signed)
Arterial Line Insertion Performed by: Eligha Bridegroom, CRNA, CRNA  Patient location: OOR procedure area. Right, radial was placed Catheter size: 20 G Maximum sterile barriers used   Attempts: 2 Procedure performed without using ultrasound guided technique. Following insertion, dressing applied and Biopatch. Post procedure assessment: normal  Patient tolerated the procedure well with no immediate complications.

## 2017-10-03 NOTE — Procedures (Signed)
Extubation Procedure Note  Pt completes Cardiac Rapid Wean without event. Follows all commands. ABG +, NIF -28, VC 1.0L, Cuff Leak +, No stridor.   Patient Details:   Name: Erik Keith DOB: 1940/04/19 MRN: [id number]   Airway Documentation:    Vent end date: 10/03/17 Vent end time: 2255   Evaluation  O2 sats: stable throughout Complications: No apparent complications Patient did tolerate procedure well. Bilateral Breath Sounds: Clear   Yes  Marissa Nestle 10/03/2017, 11:01 PM

## 2017-10-03 NOTE — Op Note (Signed)
CARDIOTHORACIC SURGERY OPERATIVE NOTE  Date of Procedure:  10/03/2017  Preoperative Diagnosis:   Severe Aortic Stenosis  Severe Multi-vessel Coronary Artery Disease  S/P Acute ST Segment Elevation Myocardial Infarction  Postoperative Diagnosis: Same  Procedure:    Aortic Valve Replacement  Edwards Inspiris Resilia Stented Bovine Pericardial Tissue Valve (size 34mm, ref # [id number], serial # [id number])   Coronary Artery Bypass Grafting x 4  Left Internal Mammary Artery to Distal Left Anterior Descending Coronary Artery  Saphenous Vein Graft to Posterior Descending Coronary Artery  Saphenous Vein Graft to First Obtuse Marginal Branch of Left Circumflex Coronary Artery  Sequential Saphenous Vein Graft to Second Obtuse Marginal Branch of Left Circumflex Coronary  Endoscopic Vein Harvest from Right Thigh and Lower Leg  Surgeon: Valentina Gu. Roxy Manns, MD  Assistant: Ellwood Handler, PA-C  Anesthesia: Suzette Battiest, MD  Operative Findings:  Severe aortic stenosis  Normal left ventricular systolic function  Good quality left internal mammary artery conduit  Good quality saphenous vein conduit  Good quality target vessels for grafting          BRIEF CLINICAL NOTE AND INDICATIONS FOR SURGERY  Patient is a 77 year old male with history of coronary artery disease status post PCI x2 in 2008, aortic stenosis, hypertension, hyperlipidemia, type 2 diabetes mellitus, iron deficiency anemia, GE reflux disease, and arthritis of the left shoulder who was recently evaluated for management of severe three-vessel coronary artery disease and severe aortic stenosis.  The patient was seen in consultation on September 20, 2017 and plans were made for elective aortic valve replacement and coronary artery bypass grafting next week.  Patient underwent routine preoperative testing yesterday.  Yesterday evening the patient went home and found his son laying dead on the living room couch.  EMS was  called but the patient's son was pronounced dead on arrival.  At that time the patient suddenly developed substernal chest pain and an EKG revealed inferior wall ST elevation consistent with ST segment elevation myocardial infarction.  Patient was brought directly to the Cath Lab where angiography revealed 99% subtotal occlusion of the left circumflex coronary artery with TIMI I flow.  The patient was treated with balloon angioplasty without stent placement and symptoms resolved.  Cardiothoracic surgical consultation was requested.  The patient has been seen in consultation and counseled at length regarding the indications, risks and potential benefits of surgery.  All questions have been answered, and the patient provides full informed consent for the operation as described.    DETAILS OF THE OPERATIVE PROCEDURE  Preparation:  The patient is brought to the operating room on the above mentioned date and central monitoring was established by the anesthesia team including placement of Swan-Ganz catheter and radial arterial line. The patient is placed in the supine position on the operating table.  Intravenous antibiotics are administered. General endotracheal anesthesia is induced uneventfully. A Foley catheter is placed.  Baseline transesophageal echocardiogram was performed.  Findings were notable for severe aortic stenosis and normal left ventricular systolic function.  There were no significant wall motion abnormalities noted on examination of left ventricular contractility.  There was mild aortic insufficiency and trace central mitral regurgitation.  No other abnormalities were noted.  The patient's chest, abdomen, both groins, and both lower extremities are prepared and draped in a sterile manner. A time out procedure is performed.   Surgical Approach and Conduit Harvest:  A median sternotomy incision was performed and the left internal mammary artery is dissected from the chest wall and  prepared  for bypass grafting. The left internal mammary artery is notably good quality conduit. Simultaneously, saphenous vein is obtained from the patient's right thigh and leg using endoscopic vein harvest technique. The saphenous vein is notably good quality conduit. After removal of the saphenous vein, the small surgical incisions in the lower extremity are closed with absorbable suture. Following systemic heparinization, the left internal mammary artery was transected distally noted to have excellent flow.   Extracorporeal Cardiopulmonary Bypass and Myocardial Protection:  The pericardium is opened. The ascending aorta is normal in appearance. The ascending aorta and the right atrium are cannulated for cardioplegia bypass.  Adequate heparinization is verified.    A retrograde cardioplegia cannula is placed through the right atrium into the coronary sinus.  The operative field was continuously flooded with carbon dioxide gas.  The entire pre-bypass portion of the operation was notable for stable hemodynamics.  Cardiopulmonary bypass was begun and a left ventricular vent placed through the right superior pulmonary vein.  The surface of the heart inspected. Distal target vessels are selected for coronary artery bypass grafting. A cardioplegia cannula is placed in the ascending aorta.  A temperature probe was placed in the interventricular septum.  The patient is cooled to 28C systemic temperature.  The aortic cross clamp is applied and cardioplegia is delivered initially in an antegrade fashion through the aortic root using modified [place] cold blood cardioplegia ([place] blood cardioplegia protocol).   The initial cardioplegic arrest is rapid with early diastolic arrest.  Repeat doses of cardioplegia are administered at 90 minutes and at least every 30 minutes thereafter through the coronary sinus catheter and through subsequently placed vein grafts in order to maintain completely flat  electrocardiogram.  Myocardial protection was felt to be excellent.   Coronary Artery Bypass Grafting:   The posterior descending branch of the right coronary artery was grafted using a reversed saphenous vein graft in an end-to-side fashion.  At the site of distal anastomosis the target vessel was good quality and measured approximately 1.5 mm in diameter.  There was diffuse disease in the distal segment of the posterior descending coronary artery beyond the insertion of the vein graft.  The first obtuse marginal branch of the left circumflex coronary artery was grafted using a reversed saphenous vein graft in an side-to-side fashion.  At the site of distal anastomosis the target vessel was good quality and measured approximately 1.8 mm in diameter.  The second obtuse marginal branch of the left circumflex coronary artery was grafted using a reversed saphenous vein graft in an end-to-side fashion off of the terminal segment of the vein graft placed to the first obtuse marginal branch.  At the site of distal anastomosis the target vessel was good quality and measured approximately 1.7 mm in diameter.  The distal left anterior coronary artery was grafted with the left internal mammary artery in an end-to-side fashion.  At the site of distal anastomosis the target vessel was good quality and measured approximately 2.0 mm in diameter.   Aortic Valve Replacement:  An oblique transverse aortotomy incision was performed.  The aortic valve was inspected and notable for a trileaflet valve with severe aortic stenosis.  The aortic valve leaflets were excised sharply and the aortic annulus decalcified.  Decalcification was notably straightforward.  The aortic annulus was sized to accept a 23 mm prosthesis.  The aortic root and left ventricle were irrigated with copious cold saline solution.  Aortic valve replacement was performed using interrupted horizontal mattress 2-0 Ethibond pledgeted  sutures with  pledgets in the subannular position.  An Edwards Inspiris Resilia stented bovine pericardial tissue valve (size 23 mm, ref # [id number], serial # [id number]) was implanted uneventfully. The valve seated appropriately with adequate space beneath the left main and right coronary artery.  All sutures were secured using a Cor-knot device.  The aortotomy was closed using a 2-layer closure of running 4-0 Prolene suture.   Procedure Completion:  All proximal vein graft anastomoses were placed directly to the ascending aorta prior to removal of the aortic cross clamp.  The septal myocardial temperature rose rapidly after reperfusion of the left internal mammary artery graft.  One final dose of warm retrograde "reanimation dose" cardioplegia was administered through the coronary sinus catheter while all air was evacuated through the aortic root.  The aortic cross clamp was removed after a total cross clamp time of 129 minutes.  All proximal and distal coronary anastomoses were inspected for hemostasis and appropriate graft orientation. Epicardial pacing wires are fixed to the right ventricular outflow tract and to the right atrial appendage. The patient is rewarmed to 37C temperature. The aortic and left ventricular vents were removed.  The patient is weaned and disconnected from cardiopulmonary bypass.  The patient's rhythm at separation from bypass was AV paced.  The patient was weaned from cardiopulmonary bypass without any inotropic support. Total cardiopulmonary bypass time for the operation was 155 minutes.  Followup transesophageal echocardiogram performed after separation from bypass revealed a well-seated bioprosthetic tissue valve in the aortic position that was functioning normally.  There was no perivalvular leak.  There were otherwise no changes from the preoperative exam.  The aortic and venous cannula were removed uneventfully. Protamine was administered to reverse the anticoagulation. The mediastinum  and pleural space were inspected for hemostasis and irrigated with saline solution. The mediastinum and  pleural space were drained using 3 chest tubes placed through separate stab incisions inferiorly.  The soft tissues anterior to the aorta were reapproximated loosely. The sternum is closed with double strength sternal wire. The soft tissues anterior to the sternum were closed in multiple layers and the skin is closed with a running subcuticular skin closure.  The post-bypass portion of the operation was notable for stable rhythm and hemodynamics.  No blood products were administered during the operation.   Disposition:  The patient tolerated the procedure well and is transported to the surgical intensive care in stable condition. There are no intraoperative complications. All sponge instrument and needle counts are verified correct at completion of the operation.   Valentina Gu. Roxy Manns MD 10/03/2017 4:49 PM

## 2017-10-04 ENCOUNTER — Encounter (HOSPITAL_COMMUNITY): Payer: Self-pay | Admitting: Thoracic Surgery (Cardiothoracic Vascular Surgery)

## 2017-10-04 ENCOUNTER — Inpatient Hospital Stay (HOSPITAL_COMMUNITY): Payer: Medicare HMO

## 2017-10-04 LAB — GLUCOSE, CAPILLARY
GLUCOSE-CAPILLARY: 110 mg/dL — AB (ref 70–99)
GLUCOSE-CAPILLARY: 120 mg/dL — AB (ref 70–99)
GLUCOSE-CAPILLARY: 124 mg/dL — AB (ref 70–99)
GLUCOSE-CAPILLARY: 126 mg/dL — AB (ref 70–99)
GLUCOSE-CAPILLARY: 127 mg/dL — AB (ref 70–99)
GLUCOSE-CAPILLARY: 128 mg/dL — AB (ref 70–99)
GLUCOSE-CAPILLARY: 143 mg/dL — AB (ref 70–99)
GLUCOSE-CAPILLARY: 91 mg/dL (ref 70–99)
GLUCOSE-CAPILLARY: 95 mg/dL (ref 70–99)
Glucose-Capillary: 94 mg/dL (ref 70–99)
Glucose-Capillary: 128 mg/dL — ABNORMAL HIGH (ref 70–99)
Glucose-Capillary: 129 mg/dL — ABNORMAL HIGH (ref 70–99)
Glucose-Capillary: 161 mg/dL — ABNORMAL HIGH (ref 70–99)
Glucose-Capillary: 145 mg/dL — ABNORMAL HIGH (ref 70–99)
Glucose-Capillary: 195 mg/dL — ABNORMAL HIGH (ref 70–99)
Glucose-Capillary: 129 mg/dL — ABNORMAL HIGH (ref 70–99)
Glucose-Capillary: 93 mg/dL (ref 70–99)
Glucose-Capillary: 163 mg/dL — ABNORMAL HIGH (ref 70–99)
Glucose-Capillary: 188 mg/dL — ABNORMAL HIGH (ref 70–99)

## 2017-10-04 LAB — BPAM PLATELET PHERESIS
Blood Product Expiration Date: 201910032359
Blood Product Expiration Date: 201910032359
ISSUE DATE / TIME: 201910031853
ISSUE DATE / TIME: 201910032140
Unit Type and Rh: 600
Unit Type and Rh: 7300

## 2017-10-04 LAB — POCT I-STAT, CHEM 8
BUN: 28 mg/dL — ABNORMAL HIGH (ref 8–23)
Calcium, Ion: 1.13 mmol/L — ABNORMAL LOW (ref 1.15–1.40)
Chloride: 106 mmol/L (ref 98–111)
Creatinine, Ser: 1.1 mg/dL (ref 0.61–1.24)
Glucose, Bld: 237 mg/dL — ABNORMAL HIGH (ref 70–99)
HCT: 33 % — ABNORMAL LOW (ref 39.0–52.0)
Hemoglobin: 11.2 g/dL — ABNORMAL LOW (ref 13.0–17.0)
Potassium: 4.7 mmol/L (ref 3.5–5.1)
Sodium: 140 mmol/L (ref 135–145)
TCO2: 23 mmol/L (ref 22–32)

## 2017-10-04 LAB — CBC
HCT: 34.8 % — ABNORMAL LOW (ref 39.0–52.0)
HEMATOCRIT: 23.7 % — AB (ref 39.0–52.0)
HEMATOCRIT: 30.3 % — AB (ref 39.0–52.0)
HEMOGLOBIN: 7.4 g/dL — AB (ref 13.0–17.0)
Hemoglobin: 9.9 g/dL — ABNORMAL LOW (ref 13.0–17.0)
Hemoglobin: 11.3 g/dL — ABNORMAL LOW (ref 13.0–17.0)
MCH: 32 pg (ref 26.0–34.0)
MCH: 30.5 pg (ref 26.0–34.0)
MCH: 30.5 pg (ref 26.0–34.0)
MCHC: 31.2 g/dL (ref 30.0–36.0)
MCHC: 32.7 g/dL (ref 30.0–36.0)
MCHC: 32.5 g/dL (ref 30.0–36.0)
MCV: 102.6 fL — AB (ref 78.0–100.0)
MCV: 93.2 fL (ref 78.0–100.0)
MCV: 94.1 fL (ref 78.0–100.0)
PLATELETS: 164 10*3/uL (ref 150–400)
PLATELETS: 132 10*3/uL — AB (ref 150–400)
Platelets: 156 10*3/uL (ref 150–400)
RBC: 2.31 MIL/uL — AB (ref 4.22–5.81)
RBC: 3.25 MIL/uL — ABNORMAL LOW (ref 4.22–5.81)
RBC: 3.7 MIL/uL — AB (ref 4.22–5.81)
RDW: 14.8 % (ref 11.5–15.5)
RDW: 19 % — AB (ref 11.5–15.5)
RDW: 19.9 % — AB (ref 11.5–15.5)
WBC: 15.3 10*3/uL — AB (ref 4.0–10.5)
WBC: 12.7 10*3/uL — ABNORMAL HIGH (ref 4.0–10.5)
WBC: 11.6 10*3/uL — ABNORMAL HIGH (ref 4.0–10.5)

## 2017-10-04 LAB — BASIC METABOLIC PANEL
ANION GAP: 5 (ref 5–15)
BUN: 23 mg/dL (ref 8–23)
CALCIUM: 7.5 mg/dL — AB (ref 8.9–10.3)
CO2: 24 mmol/L (ref 22–32)
Chloride: 114 mmol/L — ABNORMAL HIGH (ref 98–111)
Creatinine, Ser: 0.97 mg/dL (ref 0.61–1.24)
GFR calc Af Amer: >60 mL/min (ref >60)
GFR calc non Af Amer: >60 mL/min (ref >60)
GLUCOSE: 182 mg/dL — AB (ref 70–99)
Potassium: 4.4 mmol/L (ref 3.5–5.1)
Sodium: 143 mmol/L (ref 135–145)

## 2017-10-04 LAB — PREPARE PLATELET PHERESIS
UNIT DIVISION: 0
Unit division: 0

## 2017-10-04 LAB — BPAM FFP
BLOOD PRODUCT EXPIRATION DATE: 201910082359
Blood Product Expiration Date: 201910082359
ISSUE DATE / TIME: 201910031930
ISSUE DATE / TIME: 201910031930
UNIT TYPE AND RH: 7300
Unit Type and Rh: 7300

## 2017-10-04 LAB — CREATININE, SERUM
Creatinine, Ser: 1 mg/dL (ref 0.61–1.24)
Creatinine, Ser: 1.23 mg/dL (ref 0.61–1.24)
GFR calc Af Amer: >60 mL/min (ref >60)
GFR calc non Af Amer: 54 mL/min — ABNORMAL LOW (ref >60)

## 2017-10-04 LAB — PREPARE FRESH FROZEN PLASMA: Unit division: 0

## 2017-10-04 LAB — POCT I-STAT 3, ART BLOOD GAS (G3+)
Bicarbonate: 25.2 mmol/L (ref 20.0–28.0)
O2 Saturation: 94 %
PH ART: 7.341 — AB (ref 7.350–7.450)
TCO2: 27 mmol/L (ref 22–32)
pCO2 arterial: 46.6 mmHg (ref 32.0–48.0)
pO2, Arterial: 77 mmHg — ABNORMAL LOW (ref 83.0–108.0)

## 2017-10-04 LAB — MAGNESIUM
Magnesium: 3.5 mg/dL — ABNORMAL HIGH (ref 1.7–2.4)
Magnesium: 2.9 mg/dL — ABNORMAL HIGH (ref 1.7–2.4)
Magnesium: 2.9 mg/dL — ABNORMAL HIGH (ref 1.7–2.4)

## 2017-10-04 LAB — APTT: aPTT: 27 seconds (ref 24–36)

## 2017-10-04 LAB — PREPARE RBC (CROSSMATCH)

## 2017-10-04 MED ORDER — SIMVASTATIN 40 MG PO TABS
40.0000 mg | ORAL_TABLET | Freq: Every evening | ORAL | Status: DC
  Administered 2017-10-05 – 2017-10-07 (×3): 40 mg via ORAL
  Filled 2017-10-04 (×3): qty 1

## 2017-10-04 MED ORDER — KETOROLAC TROMETHAMINE 15 MG/ML IJ SOLN: 15.0000 mg | Freq: Once | INTRAMUSCULAR | Status: DC

## 2017-10-04 MED ORDER — INSULIN ASPART 100 UNIT/ML ~~LOC~~ SOLN
0.0000 [IU] | SUBCUTANEOUS | Status: DC
  Administered 2017-10-04 (×2): 4 [IU] via SUBCUTANEOUS
  Administered 2017-10-04: 8 [IU] via SUBCUTANEOUS
  Administered 2017-10-05 (×3): 4 [IU] via SUBCUTANEOUS
  Administered 2017-10-05: 8 [IU] via SUBCUTANEOUS
  Administered 2017-10-05 (×2): 4 [IU] via SUBCUTANEOUS
  Administered 2017-10-06: 2 [IU] via SUBCUTANEOUS
  Administered 2017-10-06: 4 [IU] via SUBCUTANEOUS

## 2017-10-04 MED ORDER — INSULIN DETEMIR 100 UNIT/ML ~~LOC~~ SOLN
20.0000 [IU] | Freq: Once | SUBCUTANEOUS | Status: AC
  Administered 2017-10-04: 20 [IU] via SUBCUTANEOUS
  Filled 2017-10-04: qty 0.2

## 2017-10-04 MED ORDER — ASPIRIN EC 325 MG PO TBEC
325.0000 mg | DELAYED_RELEASE_TABLET | Freq: Every day | ORAL | Status: AC
  Administered 2017-10-04: 325 mg via ORAL
  Filled 2017-10-04: qty 1

## 2017-10-04 MED ORDER — CLOPIDOGREL BISULFATE 75 MG PO TABS
75.0000 mg | ORAL_TABLET | Freq: Every day | ORAL | Status: DC
  Administered 2017-10-06 – 2017-10-08 (×3): 75 mg via ORAL
  Filled 2017-10-04 (×3): qty 1

## 2017-10-04 MED ORDER — SODIUM CHLORIDE 0.9% IV SOLUTION
Freq: Once | INTRAVENOUS | Status: AC
  Administered 2017-10-04: 05:00:00 via INTRAVENOUS

## 2017-10-04 MED ORDER — ASPIRIN EC 81 MG PO TBEC
81.0000 mg | DELAYED_RELEASE_TABLET | Freq: Every day | ORAL | Status: DC
  Administered 2017-10-05 – 2017-10-08 (×4): 81 mg via ORAL
  Filled 2017-10-04 (×4): qty 1

## 2017-10-04 MED ORDER — PANTOPRAZOLE SODIUM 40 MG PO TBEC
40.0000 mg | DELAYED_RELEASE_TABLET | Freq: Every day | ORAL | Status: DC
  Administered 2017-10-04 – 2017-10-07 (×4): 40 mg via ORAL
  Filled 2017-10-04 (×4): qty 1

## 2017-10-04 MED ORDER — ORAL CARE MOUTH RINSE
15.0000 mL | Freq: Two times a day (BID) | OROMUCOSAL | Status: DC
  Administered 2017-10-04 – 2017-10-06 (×6): 15 mL via OROMUCOSAL

## 2017-10-04 MED ORDER — KETOROLAC TROMETHAMINE 15 MG/ML IJ SOLN
15.0000 mg | Freq: Once | INTRAMUSCULAR | Status: AC
  Administered 2017-10-04: 15 mg via INTRAVENOUS
  Filled 2017-10-04: qty 1

## 2017-10-04 MED ORDER — GABAPENTIN 300 MG PO CAPS
600.0000 mg | ORAL_CAPSULE | Freq: Every day | ORAL | Status: DC
  Administered 2017-10-04 – 2017-10-07 (×4): 600 mg via ORAL
  Filled 2017-10-04 (×4): qty 2

## 2017-10-04 MED ORDER — PHENOL 1.4 % MT LIQD
1.0000 | OROMUCOSAL | Status: DC | PRN
  Administered 2017-10-04: 1 via OROMUCOSAL
  Filled 2017-10-04: qty 177

## 2017-10-04 NOTE — Progress Notes (Addendum)
[facility] DAILY ICU PROGRESS NOTE                   [address]            [place] [address]          [phone]   1 Day Post-Op Procedure(s) (LRB): AORTIC VALVE REPLACEMENT (AVR) (N/A) CORONARY ARTERY BYPASS GRAFTING (CABG)x three, USING LEFT INTERNAL MAMMARY ARTERY AND RIGHT GREATER SAPHENOUS VEIN HARVESTED ENDOSCOPICALLY (N/A)  Total Length of Stay:  LOS: 2 days   Subjective:  Complains of pain.  States not getting much relief with pain medication. He denies nausea/vomiting  Objective: Vital signs in last 24 hours: Temp:  [96.8 F (36 C)-100.6 F (38.1 C)] 100.4 F (38 C) (10/04 0700) Pulse Rate:  [59-93] 90 (10/04 0700) Cardiac Rhythm: Atrial paced (10/03 2000) Resp:  [12-30] 21 (10/04 0700) BP: (78-127)/(51-82) 107/73 (10/04 0700) SpO2:  [89 %-100 %] 89 % (10/04 0700) Arterial Line BP: (86-126)/(42-66) 113/65 (10/04 0605) FiO2 (%):  [40 %-50 %] 40 % (10/03 2224) Weight:  [98.4 kg] 98.4 kg (10/04 0500)  Filed Weights   10/03/17 0500 10/03/17 0728 10/04/17 0500  Weight: 97.9 kg 97.9 kg 98.4 kg    Weight change: 3.6 kg   Hemodynamic parameters for last 24 hours: PAP: (31-59)/(11-29) 39/20 CO:  [2.8 L/min-6 L/min] 4.4 L/min CI:  [1.6 L/min/m2-32 L/min/m2] 2 L/min/m2  Intake/Output from previous day: 10/03 0701 - 10/04 0700 In: 9888 [I.V.:4571.1; OEVOJ:5009; IV Piggyback:2480.9] Out: 3818 [Urine:5095; Blood:1000; Chest Tube:1600]  Current Meds: Scheduled Meds: . acetaminophen  1,000 mg Oral Q6H   Or  . acetaminophen (TYLENOL) oral liquid 160 mg/5 mL  1,000 mg Per Tube Q6H  . aspirin EC  325 mg Oral Daily  . [START ON 10/05/2017] aspirin EC  81 mg Oral Daily  . bisacodyl  10 mg Oral Daily   Or  . bisacodyl  10 mg Rectal Daily  . docusate sodium  200 mg Oral Daily  . gabapentin  600 mg Oral QHS  . insulin aspart  0-24 Units Subcutaneous Q4H  . insulin detemir  20 Units Subcutaneous Once  . ketorolac  15 mg Intravenous Once  . mouth rinse   15 mL Mouth Rinse BID  . metoprolol tartrate  12.5 mg Oral BID  . pantoprazole  40 mg Oral QHS  . [START ON 10/05/2017] simvastatin  40 mg Oral QPM  . sodium chloride flush  3 mL Intravenous Q12H   Continuous Infusions: . sodium chloride 0 mL/hr at 10/04/17 0609  . famotidine (PEPCID) IV Stopped (10/03/17 2304)  . insulin (NOVOLIN-R) infusion 6.5 mL/hr at 10/04/17 0700  . lactated ringers    . lactated ringers Stopped (10/03/17 1827)  . lactated ringers 20 mL/hr at 10/04/17 0700  . levofloxacin (LEVAQUIN) IV    . phenylephrine (NEO-SYNEPHRINE) Adult infusion 30 mcg/min (10/04/17 0700)   PRN Meds:.lactated ringers, metoprolol tartrate, morphine injection, ondansetron (ZOFRAN) IV, oxyCODONE, sodium chloride flush, traMADol  General appearance: alert, cooperative and no distress Heart: regular rate and rhythm Lungs: clear to auscultation bilaterally Abdomen: soft, non-tender; bowel sounds normal; no masses,  no organomegaly Extremities: edema trace Wound: clean and dry EVH wounds, aquacel in place on sternotomy  Lab Results: CBC: Recent Labs    10/03/17 2300 10/03/17 2324 10/04/17 0404  WBC 15.3*  --  12.7*  HGB 7.4* 6.5* 9.9*  HCT 23.7* 19.0* 30.3*  PLT 164  --  156   BMET:  Recent Labs  10/03/17 0747  10/03/17 2324 10/04/17 0404  NA 139   < > 145 143  K 4.4   < > 4.3 4.4  CL 108   < > 110 114*  CO2 22  --   --  24  GLUCOSE 302*   < > 125* 182*  BUN 33*   < > 24* 23  CREATININE 1.14   < > 0.90 0.97  CALCIUM 9.3  --   --  7.5*   < > = values in this interval not displayed.    CMET: Lab Results  Component Value Date   WBC 12.7 (H) 10/04/2017   HGB 9.9 (L) 10/04/2017   HCT 30.3 (L) 10/04/2017   PLT 156 10/04/2017   GLUCOSE 182 (H) 10/04/2017   ALT 40 10/02/2017   AST 44 (H) 10/02/2017   NA 143 10/04/2017   K 4.4 10/04/2017   CL 114 (H) 10/04/2017   CREATININE 0.97 10/04/2017   BUN 23 10/04/2017   CO2 24 10/04/2017   INR 1.59 10/03/2017   HGBA1C 8.5  (H) 10/02/2017      PT/INR:  Recent Labs    10/03/17 1726  LABPROT 18.8*  INR 1.59   Radiology: Dg Chest Port 1 View  Result Date: 10/03/2017 CLINICAL DATA:  Postop EXAM: PORTABLE CHEST 1 VIEW COMPARISON:  10/02/2017 FINDINGS: Interval intubation, tip of the endotracheal tube is 4.3 cm superior to the carina. Esophageal tube tip is below the diaphragm but non included. Right IJ Swan-Ganz catheter tip overlies the pulmonary outflow tract. Interval post sternotomy change in aortic valve replacement. Placement of mediastinal and chest drainage catheters. Small left pleural effusion with consolidation at the left base. No definitive pneumothorax is seen. There is mild cardiomegaly. Aortic atherosclerosis. IMPRESSION: 1. Placement of support lines and tubes as above 2. Interval postsurgical changes of the mediastinum 3. Small left pleural effusion with consolidation at the left lung base 4. Mild cardiomegaly Electronically Signed   By: Donavan Foil M.D.   On: 10/03/2017 17:36     Assessment/Plan: S/P Procedure(s) (LRB): AORTIC VALVE REPLACEMENT (AVR) (N/A) CORONARY ARTERY BYPASS GRAFTING (CABG)x three, USING LEFT INTERNAL MAMMARY ARTERY AND RIGHT GREATER SAPHENOUS VEIN HARVESTED ENDOSCOPICALLY (N/A)  1. CV- NSR with Atrial pacing, EKG obtained was done with pacer on, remains on Neosynephrine will wean as tolerated, start low dose BB 2. Pulm- CXR, with small left effusion, CT output was >1000 overnight will remain in place today 3. Renal- creatinine okay, weight up about 9 lbs, Staples be able to start lasix later today if off Neo 4. Expected post operative blood loss anemia, mild Hgb at 9.9 5. Expected post operative thrombocytopenia, mild at 156 6. Pain control- will give 1 dose of Toradol 7. Dispo- patient stable, start low dose BB, leave chest tubes in place due to increased output, CXR with small left pleural effusion, Hoehn be able to start lasix later today, in NSR, POD#1 progression  orders     Ellwood Handler 10/04/2017 7:46 AM   I have seen and examined the patient and agree with the assessment and plan as outlined.  Rexene Alberts, MD 10/04/2017 8:00 AM

## 2017-10-04 NOTE — Discharge Instructions (Signed)
Discharge Instructions:  1. You Marling shower, please wash incisions daily with soap and water and keep dry.  If you wish to cover wounds with dressing you Corvin do so but please keep clean and change daily.  No tub baths or swimming until incisions have completely healed.  If your incisions become red or develop any drainage please call our office at [phone]  2. No Driving until cleared by Dr. Roxy Manns office and you are no longer using narcotic pain medications  3. Monitor your weight daily.. Please use the same scale and weigh at same time... If you gain 5-10 lbs in 48 hours with associated lower extremity swelling, please contact our office at [phone]  4. Fever of 101.5 for at least 24 hours with no source, please contact our office at [phone]  5. Activity- up as tolerated, please walk at least 3 times per day.  Avoid strenuous activity, no lifting, pushing, or pulling with your arms over 8-10 lbs for a minimum of 6 weeks  6. If any questions or concerns arise, please do not hesitate to contact our office at [phone]

## 2017-10-04 NOTE — Discharge Summary (Addendum)
Physician Discharge Summary  Patient ID: Erik Keith MRN: [id number] DOB/AGE: 02/19/1940 77 y.o.  Admit date: 10/02/2017 Discharge date: 10/08/2017  Admission Diagnoses:  Patient Active Problem List   Diagnosis Date Noted  . Incidental pulmonary nodule, less than or equal to 6mm 10/02/2017  . STEMI involving left circumflex coronary artery ([place]) 10/02/2017  . Acute inferoposterior myocardial infarction ([place]) 10/02/2017  . Essential hypertension   . Mixed hyperlipidemia   . Coronary artery disease involving native coronary artery of native heart with unstable angina pectoris ([place])   . Severe aortic stenosis    Discharge Diagnoses:   Patient Active Problem List   Diagnosis Date Noted  . 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 Incidental pulmonary nodule, less than or equal to 54mm 10/02/2017  . STEMI involving left circumflex coronary artery ([place]) 10/02/2017  . Acute inferoposterior myocardial infarction ([place]) 10/02/2017  . Essential hypertension   . Mixed hyperlipidemia   . Coronary artery disease involving native coronary artery of native heart with unstable angina pectoris ([place])   . Severe aortic stenosis    Discharged Condition: good  History of Present Illness:  Erik Keith is a 77 white male with history of coronary artery disease status post PCI x2 in 2008, aortic stenosis, hypertension, hyperlipidemia, type 2 diabetes mellitus, iron deficiency anemia, GE reflux disease, and arthritis of the left shoulder who has been referred for surgical consultation to discuss treatment options for management of severe symptomatic aortic stenosis and multivessel coronary artery disease.  Patient's cardiac history dates back to 2008 when he presented with symptomatic coronary artery disease.  He was treated with PCI and stenting of the left anterior descending coronary artery. Approximately 6 months later he underwent PCI  and stenting of the right coronary artery.  He did well for an extended period of time and was followed by Dr. Bettina Gavia in [place].  More recently he has been followed by Dr.McGukin.  Echocardiogram performed in June 2019 reportedly revealed normal left ventricular systolic function with severe aortic stenosis.  He developed progressive symptoms of left shoulder pain and had made tentative plans for left shoulder arthroscopy.  Prior to surgery he was evaluated by anesthesia and felt that formal cardiac evaluation was indicated.  He was referred to [facility] and underwent echocardiogram September 05, 2017.  Echocardiogram confirmed the presence of moderate to severe aortic stenosis with normal left ventricular systolic function.  Peak velocity across aortic valve measured 3.6 m/s corresponding to mean transvalvular gradient estimated 28 mmHg.  The aortic valve is notably trileaflet with severely restricted leaflet mobility.  The DVI was reported 0.26 and aortic valve area calculated 0.8 cm.  Diagnostic cardiac catheterization was performed on September 12, 2017.  Catheterization revealed continued patency of the previous stents but significant progression of disease with severe three-vessel coronary artery disease.  Left ventricular end-diastolic pressure was normal.  The patient's coronary artery disease was felt to be relatively poorly suited for an attempted percutaneous coronary intervention.  Plans were made for elective aortic valve replacement and coronary artery bypass grafting next week.     Patient underwent routine preoperative testing October 02, 2017.  That evening the patient went home and found his son laying dead on the living room couch.  EMS was called but the patient's son was pronounced dead on arrival.  At that time the patient suddenly developed substernal chest pain and an EKG revealed inferior wall  ST elevation consistent with ST segment elevation myocardial infarction.  Patient was brought  directly to the Cath Lab where angiography revealed 99% subtotal occlusion of the left circumflex coronary artery with TIMI I flow.  The patient was treated with balloon angioplasty without stent placement and symptoms resolved.  Cardiothoracic surgical consultation was requested.  Hospital Course:   The patient was again evaluated by Dr. Roxy Manns.  The patient reported that his chest pain and chest tightness completely resolved after his trip to the Cath Lab.  He is understandably distraught regarding his son's death.  He denies shortness of breath.  It was felt the patient would require emergency surgery.  The risks and benefits of the procedure were explained to the patient and he was agreeable to proceed.  He was taken to the operating room on October 03, 2017.  He underwent CABG x 4 and AVR.  He also underwent endoscopic harvest of greater saphenous vein from right leg.  He tolerated the procedure without difficulty and was taken to the SICU in stable condition.  He was extubated the evening of surgery.  He was coagulopathic overnight and required transfusion of packed cells.  During his stay in the SICU the patient was weaned off Neo-synephrine as tolerated.  He was started on low dose Lopressor for HR and BP control.  However, he was SB so he was continued on the external pacer and Lopressor was stopped. His chest tubes and arterial lines were removed without difficulty.  Chest tubes were removed on 10/06. He was weaned off the Insulin drip and started on scheduled Insulin. His pre op HGA1C was 8.5. He will need close follow up with his medical doctor after discharge.  He developed Atrial Fibrillation and was treated with IV Amiodarone.  He converted to NSR, but continued to have brief episodes of atrial fibrillation.  He was transitioned to an oral regimen of Amiodarone and started on a low dose BB.  He was hyperglycemic despite insulin regimen and he was restarted on his home diabetic medications.  He was  medically stable for transfer to the telemetry unit on 10/07/2017.  He continued to progress without difficulties.  He has been maintaining NSR.  His pacing wires were removed without difficulty.  He is ambulating independently. His incisions are healing without evidence of infection. He is medically stable for discharge home today.  Significant Diagnostic Studies: cardiac graphics:   Echocardiogram:   - Left ventricle: The cavity size was normal. Wall thickness was   increased in a pattern of severe LVH. Systolic function was   normal. The estimated ejection fraction was in the range of 60%   to 65%. Wall motion was normal; there were no regional wall   motion abnormalities. Doppler parameters are consistent with   abnormal left ventricular relaxation (grade 1 diastolic   dysfunction). - Ventricular septum: Septal motion showed abnormal function and   dyssynergy. - Aortic valve: Valve mobility was restricted. There was moderate   to severe stenosis. There was mild regurgitation. Peak velocity   (S): 360 cm/s. Mean gradient (S): 28 mm Hg. Valve area (VTI): 0.9   cm^2. Valve area (Vmax): 0.77 cm^2. Valve area (Vmean): 0.8 cm^2. - Mitral valve: There was mild regurgitation. - Left atrium: Volume/bsa, ES, (1-plane Simpson&'s, A2C): 27.1   ml/m^2.  Angiography:    Mid RCA to Dist RCA lesion is 60% stenosed.  Mid RCA lesion is 70% stenosed.  RPDA lesion is 99% stenosed.  Prox Cx to Mid Cx  lesion is 99% stenosed.  Ost Cx to Prox Cx lesion is 80% stenosed.  Ost LAD to Prox LAD lesion is 30% stenosed.  Mid LAD-1 lesion is 60% stenosed.  Mid LAD-2 lesion is 70% stenosed.  LV end diastolic pressure is normal.   1. Severe triple vessel CAD 2. Moderately severe stenosis in the mid LAD. Patent stent proximal LAD 3. Severe stenosis involving a heavily calcified segment of the proximal Circumflex. Subtotal occlusion of the mid segment of the large branching obtuse marginal. The stenosis  involves both sub-branches of the obtuse marginal 4. The RCA is a large dominant vessel with a proximal aneurysmal segment followed by an old stent in the mid vessel. There is moderately severe disease in the mid vessel prior to the old stent and within the old stent.  5. Severe aortic stenosis (peak to peak gradient 40 mmHg, mean gradient 33.6 mmHg, AVA 0.87 cm2). By echo the valve leaflets appear to be thickened and calcified and they do not open well. Dimensionless index below 0.25 and AVA 0.77cm2 by echo.   Treatments: surgery:   Procedure:        Aortic Valve Replacement             Edwards Inspiris Resilia Stented Bovine Pericardial Tissue Valve (size 72mm, ref # [id number], serial # [id number])   Coronary Artery Bypass Grafting x 4             Left Internal Mammary Artery to Distal Left Anterior Descending Coronary Artery             Saphenous Vein Graft to Posterior Descending Coronary Artery             Saphenous Vein Graft to First Obtuse Marginal Branch of Left Circumflex Coronary Artery             Sequential Saphenous Vein Graft to Second Obtuse Marginal Branch of Left Circumflex Coronary             Endoscopic Vein Harvest from Right Thigh and Lower Leg  Discharge Exam: Blood pressure 114/75, pulse 78, temperature 98.2 F (36.8 C), temperature source Axillary, resp. rate 15, height 5\' 11"  (1.803 m), weight 91.6 kg, SpO2 94 %.  General appearance: alert, cooperative and no distress Heart: regular rate and rhythm Lungs: clear to auscultation bilaterally Abdomen: soft, non-tender; bowel sounds normal; no masses,  no organomegaly Extremities: edema trace Wound: clean and dry  Disposition: Home    Discharge Medications:  The patient has been discharged on:   1.Beta Blocker:  Yes [ x  ]                              No   [   ]                              If No, reason:  2.Ace Inhibitor/ARB: Yes [   ]                                     No  [  x  ]                                      If No, reason:  labile BP  3.Statin:   Yes [ x  ]                  No  [   ]                  If No, reason:  4.Ecasa:  Yes  [ x  ]                  No   [   ]                  If No, reason:     Discharge Instructions    AMB Referral to Cardiac Rehabilitation - Phase II   Complete by:  As directed    Diagnosis:   STEMI PTCA       Allergies as of 10/08/2017      Reactions   Contrast Media [iodinated Diagnostic Agents] Hives   Penicillins Hives   Has patient had a PCN reaction causing immediate rash, facial/tongue/throat swelling, SOB or lightheadedness with hypotension: unkn Has patient had a PCN reaction causing severe rash involving mucus membranes or skin necrosis: unkn Has patient had a PCN reaction that required hospitalization: unkn Has patient had a PCN reaction occurring within the last 10 years: no If all of the above answers are "NO", then [place] proceed with Cephalosporin use.      Medication List    STOP taking these medications   naproxen sodium 220 MG tablet Commonly known as:  ALEVE   predniSONE 50 MG tablet Commonly known as:  DELTASONE   ramipril 5 MG capsule Commonly known as:  ALTACE   simvastatin 40 MG tablet Commonly known as:  ZOCOR     TAKE these medications   acetaminophen 500 MG tablet Commonly known as:  TYLENOL Take 2 tablets (1,000 mg total) by mouth every 6 (six) hours as needed for mild pain or fever.   amiodarone 200 MG tablet Commonly known as:  PACERONE Take 2 tablets (400 mg total) by mouth 2 (two) times daily with a meal. X 7 days, then decrease to 200 mg BID   aspirin EC 81 MG tablet Take 81 mg by mouth daily.   atorvastatin 20 MG tablet Commonly known as:  LIPITOR Take 1 tablet (20 mg total) by mouth daily at 6 PM.   b complex vitamins tablet Take 1 tablet by mouth 4 (four) times a week.   clopidogrel 75 MG tablet Commonly known as:  PLAVIX Take 1 tablet (75 mg total) by mouth daily. Start taking on:   10/09/2017   ferrous sulfate 324 (65 Fe) MG Tbec Take 325 mg by mouth daily.   gabapentin 300 MG capsule Commonly known as:  NEURONTIN Take 600 mg by mouth at bedtime.   glipiZIDE 5 MG 24 hr tablet Commonly known as:  GLUCOTROL XL Take 5 mg by mouth 2 (two) times daily.   HYDROcodone-acetaminophen 10-325 MG tablet Commonly known as:  NORCO Take 1 tablet by mouth 2 (two) times daily.   metFORMIN 500 MG tablet Commonly known as:  GLUCOPHAGE Take 500 mg by mouth 2 (two) times daily.   metoprolol tartrate 25 MG tablet Commonly known as:  LOPRESSOR Take 0.5 tablets (12.5 mg total) by mouth 2 (two) times daily.   MIRALAX PO Take 17 g by mouth daily. WITH COFFEE   MOVE FREE JOINT HEALTH ADVANCE PO Take 1 tablet by mouth 3 (three) times a week.  nitroGLYCERIN 0.4 MG SL tablet Commonly known as:  NITROSTAT Place 0.4 mg under the tongue every 5 (five) minutes as needed for chest pain.   Oxycodone HCl 10 MG Tabs Take 1 tablet (10 mg total) by mouth every 4 (four) hours as needed for severe pain.   pantoprazole 40 MG tablet Commonly known as:  PROTONIX Take 40 mg by mouth at bedtime.   zolpidem 10 MG tablet Commonly known as:  AMBIEN Take 10 mg by mouth at bedtime as needed for sleep.            Durable Medical Equipment  (From admission, onward)         Start     Ordered   10/08/17 1012  For home use only DME Walker rolling  Once    Question:  Patient needs a walker to treat with the following condition  Answer:  Weakness   10/08/17 1012         Follow-up Information    Rexene Alberts, MD Follow up on 11/04/2017.   Specialty:  Cardiothoracic Surgery Why:  Appointment is at 2:00, please get CXR at 1:30 at [facility] located on the firs floor of our office building Contact information: [place] [address] [phone]        Imogene Burn, PA-C Follow up on 10/22/2017.   Specialty:  Cardiology Why:  Appointment  is at [employer] information: [address] [place] [address] [place] [address] [place] Follow up.   Why:  rolling walker arranged- to be delivered to room prior to discharge Contact information: [address] [place]  [id number] [phone]           Signed: Ellwood Handler 10/08/2017, 1:51 PM

## 2017-10-04 NOTE — Progress Notes (Signed)
Patient ID: Erik Keith, male   DOB: [birth year], 77 y.o.   MRN: [id number] TCTS Evening Rounds:  Hemodynamically stable off neo with BP low normal in the 90's. Atrial paced 90's Urine output ok  Chest tube output 250 cc since this am. Will leave tubes in.

## 2017-10-05 ENCOUNTER — Inpatient Hospital Stay (HOSPITAL_COMMUNITY): Payer: Medicare HMO

## 2017-10-05 DIAGNOSIS — Z953 Presence of xenogenic heart valve: Secondary | ICD-10-CM

## 2017-10-05 LAB — TYPE AND SCREEN
ABO/RH(D): B NEG
ANTIBODY SCREEN: NEGATIVE
Unit division: 0
Unit division: 0
Unit division: 0

## 2017-10-05 LAB — GLUCOSE, CAPILLARY
GLUCOSE-CAPILLARY: 222 mg/dL — AB (ref 70–99)
GLUCOSE-CAPILLARY: 125 mg/dL — AB (ref 70–99)
GLUCOSE-CAPILLARY: 156 mg/dL — AB (ref 70–99)
GLUCOSE-CAPILLARY: 199 mg/dL — AB (ref 70–99)
GLUCOSE-CAPILLARY: 215 mg/dL — AB (ref 70–99)
Glucose-Capillary: 172 mg/dL — ABNORMAL HIGH (ref 70–99)
Glucose-Capillary: 197 mg/dL — ABNORMAL HIGH (ref 70–99)
Glucose-Capillary: 182 mg/dL — ABNORMAL HIGH (ref 70–99)
Glucose-Capillary: 240 mg/dL — ABNORMAL HIGH (ref 70–99)
Glucose-Capillary: 184 mg/dL — ABNORMAL HIGH (ref 70–99)

## 2017-10-05 LAB — CBC
HCT: 34.4 % — ABNORMAL LOW (ref 39.0–52.0)
Hemoglobin: 11 g/dL — ABNORMAL LOW (ref 13.0–17.0)
MCH: 30.5 pg (ref 26.0–34.0)
MCHC: 32 g/dL (ref 30.0–36.0)
MCV: 95.3 fL (ref 78.0–100.0)
Platelets: 125 10*3/uL — ABNORMAL LOW (ref 150–400)
RBC: 3.61 MIL/uL — ABNORMAL LOW (ref 4.22–5.81)
RDW: 19.3 % — ABNORMAL HIGH (ref 11.5–15.5)
WBC: 8.8 10*3/uL (ref 4.0–10.5)

## 2017-10-05 LAB — BPAM RBC
BLOOD PRODUCT EXPIRATION DATE: 201910112359
Blood Product Expiration Date: 201910102359
Blood Product Expiration Date: 201911012359
ISSUE DATE / TIME: 201910032351
ISSUE DATE / TIME: 201910040127
ISSUE DATE / TIME: 201910040429
UNIT TYPE AND RH: 1700
Unit Type and Rh: 1700
Unit Type and Rh: 1700

## 2017-10-05 LAB — BASIC METABOLIC PANEL
Anion gap: 7 (ref 5–15)
BUN: 19 mg/dL (ref 8–23)
CALCIUM: 8.1 mg/dL — AB (ref 8.9–10.3)
CO2: 24 mmol/L (ref 22–32)
CREATININE: 0.97 mg/dL (ref 0.61–1.24)
Chloride: 109 mmol/L (ref 98–111)
GFR calc Af Amer: >60 mL/min (ref >60)
GFR calc non Af Amer: >60 mL/min (ref >60)
Glucose, Bld: 224 mg/dL — ABNORMAL HIGH (ref 70–99)
POTASSIUM: 4.4 mmol/L (ref 3.5–5.1)
Sodium: 140 mmol/L (ref 135–145)

## 2017-10-05 MED ORDER — AMIODARONE HCL IN DEXTROSE 360-4.14 MG/200ML-% IV SOLN
60.0000 mg/h | INTRAVENOUS | Status: AC
  Administered 2017-10-05 (×2): 60 mg/h via INTRAVENOUS

## 2017-10-05 MED ORDER — AMIODARONE LOAD VIA INFUSION
150.0000 mg | Freq: Once | INTRAVENOUS | Status: AC
  Administered 2017-10-05: 150 mg via INTRAVENOUS
  Filled 2017-10-05: qty 83.34

## 2017-10-05 MED ORDER — INSULIN DETEMIR 100 UNIT/ML ~~LOC~~ SOLN
30.0000 [IU] | Freq: Every day | SUBCUTANEOUS | Status: DC
  Administered 2017-10-05: 30 [IU] via SUBCUTANEOUS
  Filled 2017-10-05 (×2): qty 0.3

## 2017-10-05 MED ORDER — FUROSEMIDE 10 MG/ML IJ SOLN
40.0000 mg | Freq: Once | INTRAMUSCULAR | Status: AC
  Administered 2017-10-05: 40 mg via INTRAVENOUS
  Filled 2017-10-05: qty 4

## 2017-10-05 MED ORDER — AMIODARONE HCL IN DEXTROSE 360-4.14 MG/200ML-% IV SOLN
30.0000 mg/h | INTRAVENOUS | Status: DC
  Administered 2017-10-06: 30 mg/h via INTRAVENOUS
  Filled 2017-10-05 (×2): qty 200

## 2017-10-05 MED ORDER — METOPROLOL TARTRATE 5 MG/5ML IV SOLN
5.0000 mg | Freq: Once | INTRAVENOUS | Status: AC | PRN
  Administered 2017-10-05: 5 mg via INTRAVENOUS

## 2017-10-05 MED ORDER — AMIODARONE HCL IN DEXTROSE 360-4.14 MG/200ML-% IV SOLN
INTRAVENOUS | Status: AC
  Administered 2017-10-05: 150 mg via INTRAVENOUS
  Filled 2017-10-05: qty 200

## 2017-10-05 MED ORDER — METOPROLOL TARTRATE 5 MG/5ML IV SOLN
INTRAVENOUS | Status: AC
  Administered 2017-10-05: 5 mg via INTRAVENOUS
  Filled 2017-10-05: qty 5

## 2017-10-05 MED ORDER — METOCLOPRAMIDE HCL 5 MG/ML IJ SOLN
10.0000 mg | Freq: Four times a day (QID) | INTRAMUSCULAR | Status: AC
  Administered 2017-10-05 – 2017-10-06 (×4): 10 mg via INTRAVENOUS
  Filled 2017-10-05 (×4): qty 2

## 2017-10-05 NOTE — Plan of Care (Signed)
  Problem: Activity: Goal: Risk for activity intolerance will decrease Outcome: Progressing   Problem: Coping: Goal: Level of anxiety will decrease Outcome: Progressing   Problem: Elimination: Goal: Will not experience complications related to urinary retention Outcome: Progressing   Problem: Pain Managment: Goal: General experience of comfort will improve Outcome: Progressing   Problem: Safety: Goal: Ability to remain free from injury will improve Outcome: Progressing   Problem: Skin Integrity: Goal: Risk for impaired skin integrity will decrease Outcome: Progressing   Problem: Activity: Goal: Ability to tolerate increased activity will improve Outcome: Progressing

## 2017-10-05 NOTE — Progress Notes (Addendum)
2 Days Post-Op Procedure(s) (LRB): AORTIC VALVE REPLACEMENT (AVR) (N/A) CORONARY ARTERY BYPASS GRAFTING (CABG)x three, USING LEFT INTERNAL MAMMARY ARTERY AND RIGHT GREATER SAPHENOUS VEIN HARVESTED ENDOSCOPICALLY (N/A) Subjective: Back hurts which is chronic.  Objective: Vital signs in last 24 hours: Temp:  [96.9 F (36.1 C)-99.9 F (37.7 C)] 96.9 F (36.1 C) (10/05 0353) Pulse Rate:  [60-94] 80 (10/05 0600) Cardiac Rhythm: Atrial paced (10/05 0400) Resp:  [13-29] 13 (10/05 0600) BP: (89-122)/(53-82) 122/70 (10/05 0600) SpO2:  [88 %-96 %] 96 % (10/05 0600) Weight:  [97.5 kg] 97.5 kg (10/05 0500)  Hemodynamic parameters for last 24 hours: PAP: (45-48)/(24-28) 45/24 CO:  [4 L/min] 4 L/min CI:  [1.8 L/min/m2] 1.8 L/min/m2  Intake/Output from previous day: 10/04 0701 - 10/05 0700 In: 1393.3 [P.O.:1080; I.V.:213.3; IV Piggyback:100] Out: 7989 [Urine:1095; Chest Tube:602] Intake/Output this shift: No intake/output data recorded.  General appearance: alert and cooperative Neurologic: intact Heart: regular rate and rhythm, S1, S2 normal, no murmur, click, rub or gallop Lungs: clear to auscultation bilaterally Extremities: edema mild Wound: dressings dry  Lab Results: Recent Labs    10/04/17 1639 10/04/17 1646 10/05/17 0530  WBC 11.6*  --  8.8  HGB 11.3* 11.2* 11.0*  HCT 34.8* 33.0* 34.4*  PLT 132*  --  125*   BMET:  Recent Labs    10/04/17 0404  10/04/17 1646 10/05/17 0530  NA 143  --  140 140  K 4.4  --  4.7 4.4  CL 114*  --  106 109  CO2 24  --   --  24  GLUCOSE 182*  --  237* 224*  BUN 23  --  28* 19  CREATININE 0.97   < > 1.10 0.97  CALCIUM 7.5*  --   --  8.1*   < > = values in this interval not displayed.    PT/INR:  Recent Labs    10/03/17 1726  LABPROT 18.8*  INR 1.59   ABG    Component Value Date/Time   PHART 7.341 (L) 10/04/2017 0016   HCO3 25.2 10/04/2017 0016   TCO2 23 10/04/2017 1646   ACIDBASEDEF 1.0 10/03/2017 2252   O2SAT 94.0  10/04/2017 0016   CBG (last 3)  Recent Labs    10/04/17 1946 10/04/17 2344 10/05/17 0355  GLUCAP 222* 172* 197*   CXR: left base atelectasis, new small right ptx.  Assessment/Plan: S/P Procedure(s) (LRB): AORTIC VALVE REPLACEMENT (AVR) (N/A) CORONARY ARTERY BYPASS GRAFTING (CABG)x three, USING LEFT INTERNAL MAMMARY ARTERY AND RIGHT GREATER SAPHENOUS VEIN HARVESTED ENDOSCOPICALLY (N/A)  POD 2  Hemodynamically stable off pressors. HR 40-50 sinus under pacer so will continue atrial pacing and DC Lopressor. He got it last night for some reason.  Chest tube output decreasing.  DM: glucose still too high. Will increase Levemir to 30 and continue SSI.  Volume excess: wt is 7 lbs over preop. Start diuresis.  IS, ambulation.  Repeat CXR in am.   LOS: 3 days    Gaye Pollack 10/05/2017

## 2017-10-05 NOTE — Progress Notes (Signed)
Patient became diaphoretic and nauseated and then went into a atrial flutter with a rate between 136-166. Blood pressure stable at 112/81. Rhythm confirmed by EKG. Dr. Cyndia Bent paged. Verbal orders for 5mg  Metoprolol IV given and administered. Patient continued to have arrhythmia with rapid rate. Patient paced in AAI at 26.  Underlying rate in the 30s when rate reduced on pacer. Pacer changed to VVI and rate stabilized to 70bpm. Dr. Cyndia Bent notified by RN and requested to come assess patient and pacer. Dr. Cyndia Bent came to the bedside to assess the patient and changed the pacer to DDD with a rate of 80, then patient went back into a rapid rate. Dr. Cyndia Bent ordering amiodarone.. RN remains at the bedside.

## 2017-10-05 NOTE — Progress Notes (Signed)
Patient ID: Erik Keith, male   DOB: 30-Sep-1940, 77 y.o.   MRN: [id number] TCTS Evening Rounds:  Hemodynamically stable on amiodarone drip AV paced at 80 Urine output good. sats 95%.

## 2017-10-06 ENCOUNTER — Inpatient Hospital Stay (HOSPITAL_COMMUNITY): Payer: Medicare HMO

## 2017-10-06 LAB — GLUCOSE, CAPILLARY
GLUCOSE-CAPILLARY: 160 mg/dL — AB (ref 70–99)
GLUCOSE-CAPILLARY: 202 mg/dL — AB (ref 70–99)
Glucose-Capillary: 173 mg/dL — ABNORMAL HIGH (ref 70–99)
Glucose-Capillary: 215 mg/dL — ABNORMAL HIGH (ref 70–99)
Glucose-Capillary: 178 mg/dL — ABNORMAL HIGH (ref 70–99)
Glucose-Capillary: 172 mg/dL — ABNORMAL HIGH (ref 70–99)

## 2017-10-06 LAB — CBC
HEMATOCRIT: 35.2 % — AB (ref 39.0–52.0)
Hemoglobin: 11.2 g/dL — ABNORMAL LOW (ref 13.0–17.0)
MCH: 30.4 pg (ref 26.0–34.0)
MCHC: 31.8 g/dL (ref 30.0–36.0)
MCV: 95.4 fL (ref 78.0–100.0)
Platelets: 148 10*3/uL — ABNORMAL LOW (ref 150–400)
RBC: 3.69 MIL/uL — ABNORMAL LOW (ref 4.22–5.81)
RDW: 17.9 % — ABNORMAL HIGH (ref 11.5–15.5)
WBC: 9.9 10*3/uL (ref 4.0–10.5)

## 2017-10-06 LAB — BASIC METABOLIC PANEL
Anion gap: 7 (ref 5–15)
BUN: 18 mg/dL (ref 8–23)
CO2: 28 mmol/L (ref 22–32)
CREATININE: 0.87 mg/dL (ref 0.61–1.24)
Calcium: 8.4 mg/dL — ABNORMAL LOW (ref 8.9–10.3)
Chloride: 103 mmol/L (ref 98–111)
GFR calc Af Amer: >60 mL/min (ref >60)
GFR calc non Af Amer: >60 mL/min (ref >60)
GLUCOSE: 190 mg/dL — AB (ref 70–99)
Potassium: 3.6 mmol/L (ref 3.5–5.1)
Sodium: 138 mmol/L (ref 135–145)

## 2017-10-06 MED ORDER — TRAMADOL HCL 50 MG PO TABS
50.0000 mg | ORAL_TABLET | ORAL | Status: DC | PRN
  Administered 2017-10-06 (×2): 50 mg via ORAL
  Filled 2017-10-06 (×2): qty 1

## 2017-10-06 MED ORDER — POTASSIUM CHLORIDE CRYS ER 20 MEQ PO TBCR
20.0000 meq | EXTENDED_RELEASE_TABLET | Freq: Two times a day (BID) | ORAL | Status: DC
  Administered 2017-10-07 – 2017-10-08 (×3): 20 meq via ORAL
  Filled 2017-10-06 (×3): qty 1

## 2017-10-06 MED ORDER — INSULIN DETEMIR 100 UNIT/ML ~~LOC~~ SOLN
40.0000 [IU] | Freq: Every day | SUBCUTANEOUS | Status: DC
  Administered 2017-10-06 – 2017-10-08 (×3): 40 [IU] via SUBCUTANEOUS
  Filled 2017-10-06 (×3): qty 0.4

## 2017-10-06 MED ORDER — FUROSEMIDE 40 MG PO TABS
40.0000 mg | ORAL_TABLET | Freq: Every day | ORAL | Status: AC
  Administered 2017-10-06 – 2017-10-08 (×3): 40 mg via ORAL
  Filled 2017-10-06 (×3): qty 1

## 2017-10-06 MED ORDER — POTASSIUM CHLORIDE CRYS ER 20 MEQ PO TBCR
20.0000 meq | EXTENDED_RELEASE_TABLET | ORAL | Status: AC
  Administered 2017-10-06 (×3): 20 meq via ORAL
  Filled 2017-10-06 (×3): qty 1

## 2017-10-06 MED ORDER — INSULIN ASPART 100 UNIT/ML ~~LOC~~ SOLN
0.0000 [IU] | Freq: Three times a day (TID) | SUBCUTANEOUS | Status: DC
  Administered 2017-10-06: 4 [IU] via SUBCUTANEOUS
  Administered 2017-10-06: 8 [IU] via SUBCUTANEOUS
  Administered 2017-10-07 (×2): 4 [IU] via SUBCUTANEOUS
  Administered 2017-10-07: 2 [IU] via SUBCUTANEOUS
  Administered 2017-10-08: 4 [IU] via SUBCUTANEOUS
  Administered 2017-10-08: 2 [IU] via SUBCUTANEOUS

## 2017-10-06 MED ORDER — AMIODARONE HCL 200 MG PO TABS
200.0000 mg | ORAL_TABLET | Freq: Two times a day (BID) | ORAL | Status: DC
  Administered 2017-10-06 (×2): 200 mg via ORAL
  Filled 2017-10-06 (×2): qty 1

## 2017-10-06 NOTE — Consult Note (Signed)
Spoke with Lurline Hare, she is aware of DC [employer] order

## 2017-10-06 NOTE — Progress Notes (Signed)
Patient ID: Erik Keith, male   DOB: 12-Jun-1940, 77 y.o.   MRN: [id number] TCTS Evening Rounds  Hemodynamically stable in sinus rhythm  sats 95%  Good urine output   Ambulated around the ICU.

## 2017-10-06 NOTE — Progress Notes (Signed)
3 Days Post-Op Procedure(s) (LRB): AORTIC VALVE REPLACEMENT (AVR) (N/A) CORONARY ARTERY BYPASS GRAFTING (CABG)x three, USING LEFT INTERNAL MAMMARY ARTERY AND RIGHT GREATER SAPHENOUS VEIN HARVESTED ENDOSCOPICALLY (N/A) Subjective: Feels better, up in chair, slept some.  Objective: Vital signs in last 24 hours: Temp:  [97.6 F (36.4 C)-98 F (36.7 C)] 98 F (36.7 C) (10/06 0733) Pulse Rate:  [70-124] 80 (10/06 0700) Cardiac Rhythm: A-V Sequential paced (10/06 0400) Resp:  [11-24] 12 (10/06 0700) BP: (85-146)/(55-106) 146/80 (10/06 0600) SpO2:  [90 %-98 %] 96 % (10/06 0700) Weight:  [94.6 kg] 94.6 kg (10/06 0600)  Hemodynamic parameters for last 24 hours:    Intake/Output from previous day: 10/05 0701 - 10/06 0700 In: 447.9 [I.V.:437.9] Out: 3155 [Urine:2895; Chest Tube:260] Intake/Output this shift: No intake/output data recorded.  General appearance: alert and cooperative Neurologic: intact Heart: regular rate and rhythm, S1, S2 normal, no murmur, click, rub or gallop Lungs: clear to auscultation bilaterally Extremities: edema mild Wound: incisions ok  Lab Results: Recent Labs    10/05/17 0530 10/06/17 0330  WBC 8.8 9.9  HGB 11.0* 11.2*  HCT 34.4* 35.2*  PLT 125* 148*   BMET:  Recent Labs    10/05/17 0530 10/06/17 0330  NA 140 138  K 4.4 3.6  CL 109 103  CO2 24 28  GLUCOSE 224* 190*  BUN 19 18  CREATININE 0.97 0.87  CALCIUM 8.1* 8.4*    PT/INR:  Recent Labs    10/03/17 1726  LABPROT 18.8*  INR 1.59   ABG    Component Value Date/Time   PHART 7.341 (L) 10/04/2017 0016   HCO3 25.2 10/04/2017 0016   TCO2 23 10/04/2017 1646   ACIDBASEDEF 1.0 10/03/2017 2252   O2SAT 94.0 10/04/2017 0016   CBG (last 3)  Recent Labs    10/05/17 2313 10/06/17 0306 10/06/17 0729  GLUCAP 184* 160* 173*    Assessment/Plan: S/P Procedure(s) (LRB): AORTIC VALVE REPLACEMENT (AVR) (N/A) CORONARY ARTERY BYPASS GRAFTING (CABG)x three, USING LEFT INTERNAL MAMMARY  ARTERY AND RIGHT GREATER SAPHENOUS VEIN HARVESTED ENDOSCOPICALLY (N/A)  POD 3 Hemodynamically stable  Postop atrial fib: converted on amio. Now sinus 60's. Will convert to po amio. No beta blocker with HR 60's. Check ECG in am.  ASA/Plavix with recent MI.  Diuresed well yesterday. Wt almost at preop but still mild edema. Will give oral lasix and Kdur for a couple days.  Chest tube output low. Will remove. Small right ptx on CXR smaller. Repeat CXR in am.  DM: preop Hgb A1c 8.3. Glucose still a little high postop so will increase Levemir to 40 and switch SSI to [facility]. Resume oral meds once eating better.  Ambulate, IS.   LOS: 4 days    Gaye Pollack 10/06/2017

## 2017-10-07 ENCOUNTER — Encounter: Payer: Medicare HMO | Admitting: Thoracic Surgery (Cardiothoracic Vascular Surgery)

## 2017-10-07 ENCOUNTER — Inpatient Hospital Stay (HOSPITAL_COMMUNITY): Payer: Medicare HMO

## 2017-10-07 LAB — CBC
HEMATOCRIT: 36 % — AB (ref 39.0–52.0)
HEMOGLOBIN: 11.6 g/dL — AB (ref 13.0–17.0)
MCH: 30.6 pg (ref 26.0–34.0)
MCHC: 32.2 g/dL (ref 30.0–36.0)
MCV: 95 fL (ref 78.0–100.0)
Platelets: 184 10*3/uL (ref 150–400)
RBC: 3.79 MIL/uL — AB (ref 4.22–5.81)
RDW: 17.3 % — ABNORMAL HIGH (ref 11.5–15.5)
WBC: 10.9 10*3/uL — AB (ref 4.0–10.5)

## 2017-10-07 LAB — BASIC METABOLIC PANEL
Anion gap: 6 (ref 5–15)
BUN: 17 mg/dL (ref 8–23)
CHLORIDE: 102 mmol/L (ref 98–111)
CO2: 28 mmol/L (ref 22–32)
Calcium: 8.4 mg/dL — ABNORMAL LOW (ref 8.9–10.3)
Creatinine, Ser: 0.92 mg/dL (ref 0.61–1.24)
GFR calc non Af Amer: >60 mL/min (ref >60)
Glucose, Bld: 175 mg/dL — ABNORMAL HIGH (ref 70–99)
POTASSIUM: 3.9 mmol/L (ref 3.5–5.1)
SODIUM: 136 mmol/L (ref 135–145)

## 2017-10-07 LAB — GLUCOSE, CAPILLARY
GLUCOSE-CAPILLARY: 139 mg/dL — AB (ref 70–99)
GLUCOSE-CAPILLARY: 127 mg/dL — AB (ref 70–99)
Glucose-Capillary: 194 mg/dL — ABNORMAL HIGH (ref 70–99)
Glucose-Capillary: 176 mg/dL — ABNORMAL HIGH (ref 70–99)

## 2017-10-07 MED ORDER — AMIODARONE HCL 200 MG PO TABS
400.0000 mg | ORAL_TABLET | Freq: Two times a day (BID) | ORAL | Status: DC
  Administered 2017-10-07 – 2017-10-08 (×3): 400 mg via ORAL
  Filled 2017-10-07 (×3): qty 2

## 2017-10-07 MED ORDER — GLIPIZIDE ER 5 MG PO TB24: 5.0000 mg | ORAL_TABLET | Freq: Two times a day (BID) | ORAL | Status: DC

## 2017-10-07 MED ORDER — METOPROLOL TARTRATE 12.5 MG HALF TABLET
12.5000 mg | ORAL_TABLET | Freq: Two times a day (BID) | ORAL | Status: DC
  Administered 2017-10-07 – 2017-10-08 (×3): 12.5 mg via ORAL
  Filled 2017-10-07 (×3): qty 1

## 2017-10-07 MED ORDER — MOVING RIGHT ALONG BOOK
Freq: Once | Status: AC
  Administered 2017-10-07: 09:00:00
  Filled 2017-10-07: qty 1

## 2017-10-07 MED ORDER — SODIUM CHLORIDE 0.9% FLUSH: 3.0000 mL | INTRAVENOUS | Status: DC | PRN

## 2017-10-07 MED ORDER — GLIPIZIDE ER 5 MG PO TB24
5.0000 mg | ORAL_TABLET | Freq: Every day | ORAL | Status: DC
  Administered 2017-10-07 – 2017-10-08 (×2): 5 mg via ORAL
  Filled 2017-10-07 (×2): qty 1

## 2017-10-07 MED ORDER — POTASSIUM CHLORIDE CRYS ER 20 MEQ PO TBCR
40.0000 meq | EXTENDED_RELEASE_TABLET | Freq: Once | ORAL | Status: AC
  Administered 2017-10-07: 40 meq via ORAL
  Filled 2017-10-07: qty 2

## 2017-10-07 MED ORDER — FERROUS SULFATE 325 (65 FE) MG PO TABS
325.0000 mg | ORAL_TABLET | Freq: Every day | ORAL | Status: DC
  Administered 2017-10-07 – 2017-10-08 (×2): 325 mg via ORAL
  Filled 2017-10-07 (×2): qty 1

## 2017-10-07 MED ORDER — METFORMIN HCL 500 MG PO TABS: 500.0000 mg | ORAL_TABLET | Freq: Two times a day (BID) | ORAL | Status: DC

## 2017-10-07 MED ORDER — SODIUM CHLORIDE 0.9 % IV SOLN: 250.0000 mL | INTRAVENOUS | Status: DC | PRN

## 2017-10-07 MED ORDER — SODIUM CHLORIDE 0.9% FLUSH
3.0000 mL | Freq: Two times a day (BID) | INTRAVENOUS | Status: DC
  Administered 2017-10-07: 3 mL via INTRAVENOUS

## 2017-10-07 MED ORDER — METFORMIN HCL 500 MG PO TABS
500.0000 mg | ORAL_TABLET | Freq: Two times a day (BID) | ORAL | Status: DC
  Administered 2017-10-07 – 2017-10-08 (×3): 500 mg via ORAL
  Filled 2017-10-07 (×3): qty 1

## 2017-10-07 NOTE — Progress Notes (Addendum)
TCTS DAILY ICU PROGRESS NOTE                   [address]            [place] [address]          [phone]   4 Days Post-Op Procedure(s) (LRB): AORTIC VALVE REPLACEMENT (AVR) (N/A) CORONARY ARTERY BYPASS GRAFTING (CABG)x three, USING LEFT INTERNAL MAMMARY ARTERY AND RIGHT GREATER SAPHENOUS VEIN HARVESTED ENDOSCOPICALLY (N/A)  Total Length of Stay:  LOS: 5 days   Subjective:   Patient is doing fairly well.  He states he has been in the chair for the past 2 hours and is getting uncomfortable.  Son is in room and states funeral arrangements for his brother have been made for this weekend and they asked if okay for patient to attend.    Objective: Vital signs in last 24 hours: Temp:  [97.7 F (36.5 C)-98.6 F (37 C)] 97.9 F (36.6 C) (10/07 0739) Pulse Rate:  [62-91] 91 (10/07 0700) Cardiac Rhythm: Normal sinus rhythm (10/07 0744) Resp:  [12-21] 21 (10/07 0600) BP: (102-141)/(58-111) 110/73 (10/07 0700) SpO2:  [89 %-97 %] 91 % (10/07 0700) Weight:  [93.6 kg] 93.6 kg (10/07 0600)  Filed Weights   10/05/17 0500 10/06/17 0600 10/07/17 0600  Weight: 97.5 kg 94.6 kg 93.6 kg    Weight change: -1 kg   Intake/Output from previous day: 10/06 0701 - 10/07 0700 In: 208.1 [P.O.:120; I.V.:88.1] Out: 1620 [Urine:1600; Chest Tube:20]  Current Meds: Scheduled Meds: . acetaminophen  1,000 mg Oral Q6H   Or  . acetaminophen (TYLENOL) oral liquid 160 mg/5 mL  1,000 mg Per Tube Q6H  . amiodarone  200 mg Oral BID  . aspirin EC  81 mg Oral Daily  . bisacodyl  10 mg Oral Daily   Or  . bisacodyl  10 mg Rectal Daily  . clopidogrel  75 mg Oral Daily  . docusate sodium  200 mg Oral Daily  . furosemide  40 mg Oral Daily  . gabapentin  600 mg Oral QHS  . insulin aspart  0-24 Units Subcutaneous TID WC  . insulin detemir  40 Units Subcutaneous Daily  . mouth rinse  15 mL Mouth Rinse BID  . metFORMIN  500 mg Oral BID WC  . moving right along book   Does not apply Once    . pantoprazole  40 mg Oral QHS  . potassium chloride  20 mEq Oral BID  . simvastatin  40 mg Oral QPM  . sodium chloride flush  3 mL Intravenous Q12H  . sodium chloride flush  3 mL Intravenous Q12H   Continuous Infusions: . sodium chloride 0 mL/hr at 10/04/17 0609  . sodium chloride    . lactated ringers Stopped (10/03/17 1827)   PRN Meds:.sodium chloride, ondansetron (ZOFRAN) IV, oxyCODONE, phenol, sodium chloride flush, sodium chloride flush, traMADol  General appearance: alert, cooperative and no distress Heart: regular rate and rhythm Lungs: clear to auscultation bilaterally Abdomen: soft, non-tender; bowel sounds normal; no masses,  no organomegaly Extremities: edema trace Wound: clean and dry  Lab Results: CBC: Recent Labs    10/06/17 0330 10/07/17 0225  WBC 9.9 10.9*  HGB 11.2* 11.6*  HCT 35.2* 36.0*  PLT 148* 184   BMET:  Recent Labs    10/06/17 0330 10/07/17 0225  NA 138 136  K 3.6 3.9  CL 103 102  CO2 28 28  GLUCOSE 190* 175*  BUN 18 17  CREATININE 0.87 0.92  CALCIUM 8.4* 8.4*    CMET: Lab Results  Component Value Date   WBC 10.9 (H) 10/07/2017   HGB 11.6 (L) 10/07/2017   HCT 36.0 (L) 10/07/2017   PLT 184 10/07/2017   GLUCOSE 175 (H) 10/07/2017   ALT 40 10/02/2017   AST 44 (H) 10/02/2017   NA 136 10/07/2017   K 3.9 10/07/2017   CL 102 10/07/2017   CREATININE 0.92 10/07/2017   BUN 17 10/07/2017   CO2 28 10/07/2017   INR 1.59 10/03/2017   HGBA1C 8.5 (H) 10/02/2017      PT/INR: No results for input(s): LABPROT, INR in the last 72 hours. Radiology: Dg Chest Port 1 View  Result Date: 10/07/2017 CLINICAL DATA:  77 year old male postoperative day 4 status post CABG, aortic valve replacement. EXAM: PORTABLE CHEST 1 VIEW COMPARISON:  10/06/2017 earlier. FINDINGS: Portable AP semi upright view at 0532 hours. Right IJ introducer sheath has been removed. Mediastinal and left chest tube have been removed. Epicardial pacer wires remain. No left  pneumothorax identified. The small right pneumothorax seen yesterday is no longer apparent. Mediastinal contours remain stable. Visualized tracheal air column is within normal limits. No pulmonary edema or consolidation. Crowding of markings and mild atelectasis. Small if any pleural effusion. IMPRESSION: 1. Lines and tubes removed. 2. Small right pneumothorax resolved. No pneumothorax visible today. 3. Mild atelectasis.  Small if any pleural effusion. Electronically Signed   By: Genevie Ann M.D.   On: 10/07/2017 07:21     Assessment/Plan: S/P Procedure(s) (LRB): AORTIC VALVE REPLACEMENT (AVR) (N/A) CORONARY ARTERY BYPASS GRAFTING (CABG)x three, USING LEFT INTERNAL MAMMARY ARTERY AND RIGHT GREATER SAPHENOUS VEIN HARVESTED ENDOSCOPICALLY (N/A)  1. CV- PAF, maintaining NSR- will continue Amiodarone, hold BB as he is bradycardic 2. Pulm- wean oxygen as tolerated, CXR shows resolution of previous pneumothorax, continue IS 3. Renal- creatinine WNL, continue lasix, potassium 4. DM- sugars remain elevated, will leave Levemir as is, start Metformin at a reduced dose and can increase as oral intake improves 5. Dispo- patient stable, in NSR, will plan to transfer to 4E today, d/c EPW, okay for patient to attend funeral this weekend.  Just instructed son, that he should take a break, possibly ready for d/c in 48 hours     Erin Barrett 10/07/2017 8:00 AM   I have seen and examined the patient and agree with the assessment and plan as outlined.  Will start low dose metoprolol and increase amiodarone dose since patient still has occasional episodes of PAF with increased HR.  If PAF continues will need to consider starting warfarin and stopping Plavix.  Restart Glucotrol and metformin, continue levemir for now since CBG's still somewhat elevated.  Transfer 4E.  Mobilize. Hopefully ready for d/c home within the next couple days.  Rexene Alberts, MD 10/07/2017 8:30 AM

## 2017-10-07 NOTE — Progress Notes (Signed)
Attempted to call report to 4E at this time

## 2017-10-07 NOTE — Progress Notes (Signed)
Report called to 4E at this time.

## 2017-10-07 NOTE — Plan of Care (Signed)
Erik Keith has transfer orders to 4E. Awaiting a bed at this time.

## 2017-10-07 NOTE — Progress Notes (Addendum)
Inpatient Diabetes Program Recommendations  AACE/ADA: New Consensus Statement on Inpatient Glycemic Control (2015)  Target Ranges:  Prepandial:   less than 140 mg/dL      Peak postprandial:   less than 180 mg/dL (1-2 hours)      Critically ill patients:  140 - 180 mg/dL   Results for Erik Keith, Erik Keith (MRN [id number]) as of 10/07/2017 07:01  Ref. Range 10/06/2017 07:29 10/06/2017 11:34 10/06/2017 15:45 10/06/2017 21:15  Glucose-Capillary Latest Ref Range: 70 - 99 mg/dL 173 (H)  4 units NOVOLOG 215 (H)  8 units NOVOLOG +  40 units LEVEMIR 178 (H)  4 units NOVOLOG 202 (H)   Results for Erik Keith, Erik Keith (MRN [id number]) as of 10/07/2017 07:01  Ref. Range 10/07/2017 06:37  Glucose-Capillary Latest Ref Range: 70 - 99 mg/dL 194 (H)  4 units NOVOLOG    Results for Erik Keith, Erik Keith (MRN [id number]) as of 10/07/2017 07:01  Ref. Range 08/22/2017 14:30 10/02/2017 14:23  Hemoglobin A1C Latest Ref Range: 4.8 - 5.6 % 7.9 (H)  180 mg/dl 8.5 (H)  197 mg/dl   Home DM Meds: Glipizide 5 mg BID       Metformin 500 mg BID  Current Orders: Levemir 40 units Daily       Novolog 0-24 units TID    PCP: Dr. Gilford Rile ([facility] in [place], [place])    MD- Note fasting glucose still mildly elevated despite increase of Levemir to 40 units Daily yesterday.  Also having some post-meal excursions >200 mg/dl as well.  Please consider the following:  1. Increase Levemir slightly to 44 units Daily (10% increase)  2. If patient eating well, could try adding back home oral DM meds of Metformin and Glipizide   Addendum 11am- Met with pt this AM to discuss current A1c and home DM care regimen.  Pt told me he regularly takes his oral DM meds at home.  Checks CBGs once per day.  Has been taking the same regimen for a while.  Follows with Dr. Bea Graff for medical management in [place].    Discussed with pt why we are giving him insulin.  Explained the 2 different insulins we are giving and how the MDs  will likely try to wean him off the insulin as he gets closer to d/c.  Pt told me that the MD did not mention starting insulin for home yet.  Discussed with pt that Dr. Roxy Manns has restarted his home oral DM meds today as well.  Reviewed pt's current A1c of 8.5%.  Explained what an A1c is and what it measures.  Reminded patient that his goal A1c is 7% or less per ADA standards to prevent both acute and long-term complications.  Explained to patient the extreme importance of good glucose control at home.  Encouraged patient to check his CBGs at least bid at home (fasting and another check within the day) and to record all CBGs in a logbook for his PCP to review.    --Will follow patient during hospitalization--  Wyn Quaker RN, MSN, CDE Diabetes Coordinator Inpatient Glycemic Control Team Team Pager: [phone] (8a-5p)

## 2017-10-07 NOTE — Progress Notes (Signed)
TCTS BRIEF SICU PROGRESS NOTE  4 Days Post-Op  S/P Procedure(s) (LRB): AORTIC VALVE REPLACEMENT (AVR) (N/A) CORONARY ARTERY BYPASS GRAFTING (CABG)x three, USING LEFT INTERNAL MAMMARY ARTERY AND RIGHT GREATER SAPHENOUS VEIN HARVESTED ENDOSCOPICALLY (N/A)   Had a good day Ambulated 2 laps around ICU Appetite improved and + BM O2 sats stable on room air NSR w/ stable BP  Plan: Awaiting transfer 4E.  Possible d/c home soon  Rexene Alberts, MD 10/07/2017 6:30 PM

## 2017-10-07 NOTE — Progress Notes (Signed)
Patient dropping his oxygen saturations in the low 80s when he sleeps. RN placed pt back on 2L [place]. Sat 94%

## 2017-10-08 ENCOUNTER — Inpatient Hospital Stay (HOSPITAL_COMMUNITY): Payer: Medicare HMO

## 2017-10-08 ENCOUNTER — Inpatient Hospital Stay: Admit: 2017-10-08 | Payer: Medicare HMO | Admitting: Thoracic Surgery (Cardiothoracic Vascular Surgery)

## 2017-10-08 LAB — BASIC METABOLIC PANEL
ANION GAP: 11 (ref 5–15)
BUN: 19 mg/dL (ref 8–23)
CHLORIDE: 100 mmol/L (ref 98–111)
CO2: 27 mmol/L (ref 22–32)
CREATININE: 1.13 mg/dL (ref 0.61–1.24)
Calcium: 9 mg/dL (ref 8.9–10.3)
GFR calc Af Amer: >60 mL/min (ref >60)
GLUCOSE: 165 mg/dL — AB (ref 70–99)
Potassium: 3.6 mmol/L (ref 3.5–5.1)
SODIUM: 138 mmol/L (ref 135–145)

## 2017-10-08 LAB — GLUCOSE, CAPILLARY
Glucose-Capillary: 129 mg/dL — ABNORMAL HIGH (ref 70–99)
Glucose-Capillary: 167 mg/dL — ABNORMAL HIGH (ref 70–99)

## 2017-10-08 LAB — MAGNESIUM: Magnesium: 2.3 mg/dL (ref 1.7–2.4)

## 2017-10-08 LAB — CBC
HCT: 38.5 % — ABNORMAL LOW (ref 39.0–52.0)
Hemoglobin: 12.2 g/dL — ABNORMAL LOW (ref 13.0–17.0)
MCH: 30.5 pg (ref 26.0–34.0)
MCHC: 31.7 g/dL (ref 30.0–36.0)
MCV: 96.3 fL (ref 80.0–100.0)
Platelets: 293 10*3/uL (ref 150–400)
RBC: 4 MIL/uL — ABNORMAL LOW (ref 4.22–5.81)
RDW: 17.2 % — ABNORMAL HIGH (ref 11.5–15.5)
WBC: 13.8 10*3/uL — AB (ref 4.0–10.5)

## 2017-10-08 SURGERY — REPLACEMENT, AORTIC VALVE, OPEN
Anesthesia: General | Site: Chest

## 2017-10-08 MED ORDER — OXYCODONE HCL 10 MG PO TABS: 10.0000 mg | ORAL_TABLET | ORAL | 0 refills | Status: DC | PRN

## 2017-10-08 MED ORDER — ACETAMINOPHEN 500 MG PO TABS: 1000.0000 mg | ORAL_TABLET | Freq: Four times a day (QID) | ORAL | 0 refills | Status: DC | PRN

## 2017-10-08 MED ORDER — POTASSIUM CHLORIDE CRYS ER 20 MEQ PO TBCR
40.0000 meq | EXTENDED_RELEASE_TABLET | Freq: Once | ORAL | Status: AC
  Administered 2017-10-08: 40 meq via ORAL
  Filled 2017-10-08: qty 2

## 2017-10-08 MED ORDER — AMIODARONE HCL 200 MG PO TABS: 400.0000 mg | ORAL_TABLET | Freq: Two times a day (BID) | ORAL | 1 refills | Status: DC

## 2017-10-08 MED ORDER — METOPROLOL TARTRATE 25 MG PO TABS: 12.5000 mg | ORAL_TABLET | Freq: Two times a day (BID) | ORAL | 3 refills | Status: DC

## 2017-10-08 MED ORDER — ATORVASTATIN CALCIUM 20 MG PO TABS: 20.0000 mg | ORAL_TABLET | Freq: Every day | ORAL | 3 refills | Status: DC

## 2017-10-08 MED ORDER — CLOPIDOGREL BISULFATE 75 MG PO TABS: 75.0000 mg | ORAL_TABLET | Freq: Every day | ORAL | 3 refills | Status: DC

## 2017-10-08 MED ORDER — ATORVASTATIN CALCIUM 20 MG PO TABS: 20.0000 mg | ORAL_TABLET | Freq: Every day | ORAL | Status: DC

## 2017-10-08 NOTE — Progress Notes (Signed)
CARDIAC REHAB PHASE I   PRE:  Rate/Rhythm: 86 SR    BP: sitting 106/75    SaO2: 92 RA  MODE:  Ambulation: 470 ft   POST:  Rate/Rhythm: 86 SR    BP: sitting 114/75     SaO2: 91 RA  Pt moving independently around room, no c/o. Used RW for hall, slow, steady pace. No c/o. SaO2 difficult to register due to cold fingers. 91-92 RA when it did register. Encouraged IS. To EOB, I will f/u later for education with wife. He would like RW for home.  [place], ACSM 10/08/2017 9:10 AM

## 2017-10-08 NOTE — Progress Notes (Signed)
Discharge instructions reviewed with patient and wife. All questions answered.   Emelda Fear, RN

## 2017-10-08 NOTE — Progress Notes (Signed)
CARDIOLOGY RECOMMENDATIONS:  Discharge is anticipated in the next 48 hours. Recommendations for medications and follow up:  Discharge Medications: Continue medications as they are currently listed in the [facility]. Exceptions to the above:  Would continue amiodarone at 400 mg bid for one week then 400 mg daily  Follow Up: The patient's Primary Cardiologist is Shirlee More MD  Follow up in the office in 2 week(s).  Signed,  Hagop Martinique, MD  11:41 AM 10/08/2017  [facility]

## 2017-10-08 NOTE — Progress Notes (Signed)
Patient to X ray at this time.

## 2017-10-08 NOTE — Progress Notes (Addendum)
      [address]       [place] [address]             [phone]      5 Days Post-Op Procedure(s) (LRB): AORTIC VALVE REPLACEMENT (AVR) (N/A) CORONARY ARTERY BYPASS GRAFTING (CABG)x three, USING LEFT INTERNAL MAMMARY ARTERY AND RIGHT GREATER SAPHENOUS VEIN HARVESTED ENDOSCOPICALLY (N/A)   Subjective:  No new complaints.  Patient states he is feeling pretty good.  He is ambulating with minimal difficulty.  + BM  Objective: Vital signs in last 24 hours: Temp:  [97.9 F (36.6 C)-98.5 F (36.9 C)] 98.5 F (36.9 C) (10/08 0413) Pulse Rate:  [66-93] 78 (10/08 0413) Cardiac Rhythm: Normal sinus rhythm (10/08 0700) Resp:  [17-18] 17 (10/08 0504) BP: (98-141)/(58-81) 118/65 (10/08 0413) SpO2:  [90 %-98 %] 96 % (10/08 0413) Weight:  [91.6 kg] 91.6 kg (10/08 0504)  Intake/Output from previous day: 10/07 0701 - 10/08 0700 In: 270 [P.O.:270] Out: 1100 [Urine:1100]  General appearance: alert, cooperative and no distress Heart: regular rate and rhythm Lungs: clear to auscultation bilaterally Abdomen: soft, non-tender; bowel sounds normal; no masses,  no organomegaly Extremities: edema trace Wound: clean and dry  Lab Results: Recent Labs    10/07/17 0225 10/08/17 0711  WBC 10.9* 13.8*  HGB 11.6* 12.2*  HCT 36.0* 38.5*  PLT 184 293   BMET:  Recent Labs    10/06/17 0330 10/07/17 0225  NA 138 136  K 3.6 3.9  CL 103 102  CO2 28 28  GLUCOSE 190* 175*  BUN 18 17  CREATININE 0.87 0.92  CALCIUM 8.4* 8.4*    PT/INR: No results for input(s): LABPROT, INR in the last 72 hours. ABG    Component Value Date/Time   PHART 7.341 (L) 10/04/2017 0016   HCO3 25.2 10/04/2017 0016   TCO2 23 10/04/2017 1646   ACIDBASEDEF 1.0 10/03/2017 2252   O2SAT 94.0 10/04/2017 0016   CBG (last 3)  Recent Labs    10/07/17 1559 10/07/17 2148 10/08/17 0641  GLUCAP 139* 127* 129*    Assessment/Plan: S/P Procedure(s) (LRB): AORTIC VALVE REPLACEMENT (AVR)  (N/A) CORONARY ARTERY BYPASS GRAFTING (CABG)x three, USING LEFT INTERNAL MAMMARY ARTERY AND RIGHT GREATER SAPHENOUS VEIN HARVESTED ENDOSCOPICALLY (N/A)  1. CV- PAF, currently in NSR- on Amiodarone, Lopressor 2. Pulm- no acute issues, continue IS 3. Renal- creatinine WNL, weight is below baseline, final dose of Lasix today ordered for today 4. DM- sugars controlled, continue Metformin, Glipizide 5. Dispo- patient stable, sugars improved, remains in sinus rhythm, possibly ready for d/c today, will discuss with Dr. Roxy Manns    LOS: 6 days    Ellwood Handler 10/08/2017  I have seen and examined the patient and agree with the assessment and plan as outlined.  D/C home today  Rexene Alberts, MD 10/08/2017 9:22 AM

## 2017-10-08 NOTE — Progress Notes (Signed)
CARDIAC REHAB PHASE I   Ed completed with pt, wife, and granddaughter. Good reception. Will refer to [place].  [place], ACSM 10/08/2017 2:02 PM

## 2017-10-08 NOTE — Care Management Note (Signed)
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 Order placed and call made to [facility] with [facility] for DME needs- RW to be delivered to room prior to discharge.   Expected Discharge Date:  10/08/17               Expected Discharge Plan:  Home/Self Care  In-House Referral:  NA  Discharge planning Services  CM Consult  Post Acute Care Choice:  Durable Medical Equipment Choice offered to:  Patient  DME Arranged:  Gilford Rile rolling DME Agency:  [place]:  NA [place] Agency:  NA  Status of Service:  Completed, signed off  If discussed at [place] of Stay Meetings, dates discussed:   Discharge Disposition: home/self care    Additional Comments:  Dawayne Patricia, RN 10/08/2017, 10:18 AM

## 2017-10-08 NOTE — Progress Notes (Signed)
IV and telemetry discontinued. Patient tolerated well.

## 2017-10-08 NOTE — Progress Notes (Addendum)
Chest tube sutures removed at this time. Benzoin and steri strips applied.

## 2017-10-09 MED FILL — Albumin, Human Inj 5%: INTRAVENOUS | Qty: 250 | Status: AC

## 2017-10-09 MED FILL — Lidocaine HCl(Cardiac) IV PF Soln Pref Syr 100 MG/5ML (2%): INTRAVENOUS | Qty: 25 | Status: AC

## 2017-10-09 MED FILL — Mannitol IV Soln 20%: INTRAVENOUS | Qty: 1000 | Status: AC

## 2017-10-09 MED FILL — Electrolyte-R (PH 7.4) Solution: INTRAVENOUS | Qty: 5000 | Status: AC

## 2017-10-09 MED FILL — Sodium Chloride IV Soln 0.9%: INTRAVENOUS | Qty: 3000 | Status: AC

## 2017-10-09 MED FILL — Sodium Bicarbonate IV Soln 8.4%: INTRAVENOUS | Qty: 50 | Status: AC

## 2017-10-09 MED FILL — Heparin Sodium (Porcine) Inj 1000 Unit/ML: INTRAMUSCULAR | Qty: 10 | Status: AC

## 2017-10-22 ENCOUNTER — Encounter: Payer: Self-pay | Admitting: Physician Assistant

## 2017-10-22 ENCOUNTER — Encounter: Payer: Medicare HMO | Admitting: Physician Assistant

## 2017-10-22 ENCOUNTER — Ambulatory Visit: Payer: Medicare HMO | Admitting: Physician Assistant

## 2017-10-22 VITALS — BP 102/72 | HR 43 | Ht 71.0 in | Wt 196.4 lb

## 2017-10-22 DIAGNOSIS — E782 Mixed hyperlipidemia: Secondary | ICD-10-CM

## 2017-10-22 DIAGNOSIS — I9789 Other postprocedural complications and disorders of the circulatory system, not elsewhere classified: Secondary | ICD-10-CM

## 2017-10-22 DIAGNOSIS — Z953 Presence of xenogenic heart valve: Secondary | ICD-10-CM

## 2017-10-22 DIAGNOSIS — E119 Type 2 diabetes mellitus without complications: Secondary | ICD-10-CM | POA: Insufficient documentation

## 2017-10-22 DIAGNOSIS — I4891 Unspecified atrial fibrillation: Secondary | ICD-10-CM | POA: Insufficient documentation

## 2017-10-22 MED ORDER — ASPIRIN EC 81 MG PO TBEC: 81.0000 mg | DELAYED_RELEASE_TABLET | Freq: Every day | ORAL | 3 refills | Status: DC

## 2017-10-22 MED ORDER — NITROGLYCERIN 0.4 MG SL SUBL: 0.4000 mg | SUBLINGUAL_TABLET | SUBLINGUAL | 3 refills | Status: AC | PRN

## 2017-10-22 MED ORDER — METOPROLOL TARTRATE 25 MG PO TABS: 12.5000 mg | ORAL_TABLET | Freq: Two times a day (BID) | ORAL | 3 refills | Status: DC

## 2017-10-22 MED ORDER — CLOPIDOGREL BISULFATE 75 MG PO TABS: 75.0000 mg | ORAL_TABLET | Freq: Every day | ORAL | 3 refills | Status: DC

## 2017-10-22 MED ORDER — ATORVASTATIN CALCIUM 20 MG PO TABS: 20.0000 mg | ORAL_TABLET | Freq: Every day | ORAL | 3 refills | Status: DC

## 2017-10-22 MED ORDER — AMIODARONE HCL 200 MG PO TABS: 200.0000 mg | ORAL_TABLET | Freq: Every day | ORAL | 3 refills | Status: DC

## 2017-10-22 NOTE — Progress Notes (Signed)
Date:  10/22/2017   ID:  Erik Keith, DOB 05/09/1940, MRN [id number]  PCP:  Raina Mina., MD  Cardiologist: Lauree Chandler, MD EPS: None  Chief Complaint  Patient presents with  . Hospitalization Follow-up    History of Present Illness:  Erik Keith is a 77 y.o. male with history of CAD status post PCI of the LAD in 6 months later stenting to the RCA 2008 followed by Dr. Armond Hang & McGukin in [place], aortic stenosis, hypertension, HLD, DM type II, iron deficiency anemia, GERD.  2D echo 06/2017 normal LV function with severe aortic stenosis.   2D echo 09/05/2017 showed moderate to severe aortic stenosis with normal LV function.  Cardiac cath revealed patent stents but significant three-vessel disease.  Patient was scheduled for surgery but then found his son dead on the couch.  He then returned to the hospital with an inferior STEMI and taken to the Cath Lab treated with balloon angioplasty of a 99% left circumflex without stent placement.  Patient underwent emergency CABG x4 and AVR 10/03/2017.  He was initially started on metoprolol but because of sinus bradycardia this was stopped.  Preop hemoglobin A1c was 8.5.  Placed on amiodarone for atrial fibrillation and was in normal sinus rhythm at discharge.  Patient comes in today accompanied by his wife.  He is feeling very well.  He denies any chest pain, palpitations, dyspnea, dyspnea on exertion, dizziness or presyncope.  Plans to do cardiac rehab at [facility].  He wants to be followed by Dr. Angelena Form here in [place].    Past Medical History:  Diagnosis Date  . Aortic valve stenosis, severe   . CAD S/P percutaneous coronary angioplasty    12-2006  STENT TO LAD;  07-2007 DEStenting TO RCA   . GERD (gastroesophageal reflux disease)   . Hypertension   . Incidental pulmonary nodule, less than or equal to 20mm 10/02/2017   Ground glass opacities RUL seen on CTA  . Iron deficiency anemia   . Mixed hyperlipidemia   .  OA (osteoarthritis) of shoulder    LEFT SHOULDER AND AC JOINT  . 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 Type 2 diabetes mellitus ([place])     Past Surgical History:  Procedure Laterality Date  . AORTIC VALVE REPLACEMENT N/A 10/03/2017   Procedure: AORTIC VALVE REPLACEMENT (AVR);  Surgeon: Rexene Alberts, MD;  Location: [facility];  Service: Open Heart Surgery;  Laterality: N/A;  . APPENDECTOMY    . BACK SURGERY  x3  . CORONARY ARTERY BYPASS GRAFT N/A 10/03/2017   Procedure: CORONARY ARTERY BYPASS GRAFTING (CABG)x three, USING LEFT INTERNAL MAMMARY ARTERY AND RIGHT GREATER SAPHENOUS VEIN HARVESTED ENDOSCOPICALLY;  Surgeon: Rexene Alberts, MD;  Location: [facility];  Service: Open Heart Surgery;  Laterality: N/A;  . CORONARY/GRAFT ACUTE MI REVASCULARIZATION N/A 10/02/2017   Procedure: Coronary/Graft Acute MI Revascularization;  Surgeon: Jettie Booze, MD;  Location: [facility];  Service: Cardiovascular;  Laterality: N/A;  . LEFT HEART CATH AND CORONARY ANGIOGRAPHY N/A 10/02/2017   Procedure: LEFT HEART CATH AND CORONARY ANGIOGRAPHY;  Surgeon: Jettie Booze, MD;  Location: [facility];  Service: Cardiovascular;  Laterality: N/A;  . RIGHT/LEFT HEART CATH AND CORONARY ANGIOGRAPHY N/A 09/12/2017   Procedure: RIGHT/LEFT HEART  CATH AND CORONARY ANGIOGRAPHY;  Surgeon: Burnell Blanks, MD;  Location: [facility];  Service: Cardiovascular;  Laterality: N/A;  . TONSILLECTOMY      Current Medications: No outpatient medications have been marked as taking for the 10/22/17 encounter (Office Visit) with Imogene Burn, PA-C.     Allergies:   Contrast media [iodinated diagnostic agents] and Penicillins   Social History   Socioeconomic History  . Marital status: Married     Spouse name: Lenell Antu  . Number of children: 2  . Years of education: Not on file  . Highest education level: Not on file  Occupational History  . Occupation: Retired-worked at [employer]  . Financial resource strain: Not on file  . Food insecurity:    Worry: Not on file    Inability: Not on file  . Transportation needs:    Medical: Not on file    Non-medical: Not on file  Tobacco Use  . Smoking status: Former Smoker    Years: 40.00    Last attempt to quit: 2002    Years since quitting: 17.8  . Smokeless tobacco: Never Used  Substance and Sexual Activity  . Alcohol use: Never    Frequency: Never  . Drug use: Never  . Sexual activity: Not on file  Lifestyle  . Physical activity:    Days per week: Not on file    Minutes per session: Not on file  . Stress: Not on file  Relationships  . Social connections:    Talks on phone: Not on file    Gets together: Not on file    Attends religious service: Not on file    Active member of club or organization: Not on file    Attends meetings of clubs or organizations: Not on file    Relationship status: Not on file  Other Topics Concern  . Not on file  Social History Narrative  . Not on file     Family History:  The patient's family history includes CVA in his father; Heart attack in his brother and sister.   ROS:   Please see the history of present illness.    Review of Systems  Constitution: Positive for decreased appetite.  HENT: Negative.   Cardiovascular: Positive for chest pain.  Respiratory: Negative.   Endocrine: Negative.   Hematologic/Lymphatic: Bruises/bleeds easily.  Musculoskeletal: Positive for back pain.  Gastrointestinal: Negative.   Genitourinary: Negative.   Neurological: Negative.    All other systems reviewed and are negative.   PHYSICAL EXAM:    BP 102/72   Pulse (!) 43   Ht 5\' 11"  (1.803 m)   Wt 196 lb 6.4 oz (89.1 kg)   SpO2 96%   BMI 27.39 kg/m   Physical Exam  GEN: Well  nourished, well developed, in no acute distress  Neck: Bilateral carotid bruits no JVD, or masses Cardiac:RRR; no double murmurs, rubs, or gallops  Respiratory:  clear to auscultation bilaterally, normal work of breathing GI: soft, nontender, nondistended, + BS Ext: without cyanosis, clubbing, or edema, Good distal pulses bilaterally Neuro:  Alert and Oriented x 3 Psych: euthymic mood, full affect  Wt Readings from Last 3 Encounters:  10/22/17 196 lb 6.4 oz (89.1 kg)  10/08/17 202 lb (91.6 kg)  10/02/17 202 lb 6.4 oz (91.8 kg)      Studies/Labs Reviewed:   EKG:  EKG is  ordered today.  The ekg ordered today demonstrates sinus bradycardia at 43 bpm  Recent Labs: 10/02/2017: ALT 40 10/08/2017: BUN 19; Creatinine, Ser 1.13; Hemoglobin 12.2; Magnesium 2.3; Platelets 293; Potassium 3.6; Sodium 138   Lipid Panel No results found for: CHOL, TRIG, HDL, CHOLHDL, VLDL, LDLCALC, LDLDIRECT  Additional studies/ records that were reviewed today include:  Echocardiogram:   - Left ventricle: The cavity size was normal. Wall thickness was   increased in a pattern of severe LVH. Systolic function was   normal. The estimated ejection fraction was in the range of 60%   to 65%. Wall motion was normal; there were no regional wall   motion abnormalities. Doppler parameters are consistent with   abnormal left ventricular relaxation (grade 1 diastolic   dysfunction). - Ventricular septum: Septal motion showed abnormal function and   dyssynergy. - Aortic valve: Valve mobility was restricted. There was moderate   to severe stenosis. There was mild regurgitation. Peak velocity   (S): 360 cm/s. Mean gradient (S): 28 mm Hg. Valve area (VTI): 0.9   cm^2. Valve area (Vmax): 0.77 cm^2. Valve area (Vmean): 0.8 cm^2. - Mitral valve: There was mild regurgitation. - Left atrium: Volume/bsa, ES, (1-plane Simpson&'s, A2C): 27.1   ml/m^2.   Angiography:     Mid RCA to Dist RCA lesion is 60% stenosed.  Mid  RCA lesion is 70% stenosed.  RPDA lesion is 99% stenosed.  Prox Cx to Mid Cx lesion is 99% stenosed.  Ost Cx to Prox Cx lesion is 80% stenosed.  Ost LAD to Prox LAD lesion is 30% stenosed.  Mid LAD-1 lesion is 60% stenosed.  Mid LAD-2 lesion is 70% stenosed.  LV end diastolic pressure is normal.   1. Severe triple vessel CAD 2. Moderately severe stenosis in the mid LAD. Patent stent proximal LAD 3. Severe stenosis involving a heavily calcified segment of the proximal Circumflex. Subtotal occlusion of the mid segment of the large branching obtuse marginal. The stenosis involves both sub-branches of the obtuse marginal 4. The RCA is a large dominant vessel with a proximal aneurysmal segment followed by an old stent in the mid vessel. There is moderately severe disease in the mid vessel prior to the old stent and within the old stent.  5. Severe aortic stenosis (peak to peak gradient 40 mmHg, mean gradient 33.6 mmHg, AVA 0.87 cm2). By echo the valve leaflets appear to be thickened and calcified and they do not open well. Dimensionless index below 0.25 and AVA 0.77cm2 by echo.    Treatments: surgery:    Procedure:         Aortic Valve Replacement             Edwards Inspiris Resilia Stented Bovine Pericardial Tissue Valve (size 60mm, ref # [id number], serial # [id number])    Coronary Artery Bypass Grafting x 4             Left Internal Mammary Artery to Distal Left Anterior Descending Coronary Artery             Saphenous Vein Graft to Posterior Descending Coronary Artery             Saphenous Vein Graft to First Obtuse Marginal Branch of Left Circumflex Coronary Artery             Sequential Saphenous Vein Graft to Second Obtuse Marginal Branch of Left Circumflex Coronary             Endoscopic Vein Harvest from Right Thigh and Lower Leg   Carotid Dopplers 10/02/2017 final Interpretation: Right Carotid: Velocities in the  right ICA are consistent with a 1-39% stenosis.  Left Carotid:  Velocities in the left ICA are consistent with a 1-39% stenosis. Vertebrals: Bilateral vertebral arteries demonstrate antegrade flow.     ASSESSMENT:    1. S/P aortic valve replacement with bioprosthetic valve + CABG x4   2. Mixed hyperlipidemia   3. Postoperative atrial fibrillation (HCC)   4. Type 2 diabetes mellitus with other circulatory complication, without long-term current use of insulin (HCC)      PLAN:  In order of problems listed above:  Status post emergency AVR and CABG times 4-10/03/17 after STEMI and balloon angioplasty to the circumflex in the setting of extreme stress finding his son passed away.  On Plavix and aspirin.  Hyperlipidemia on Lipitor 20 mg daily.  Had a trouble with increased LFTs on this in the past.  Will check fasting lipid panel LFTs in 2 months.  Can be done by PCP.  Postop atrial fibrillation on amiodarone 400 mg daily heart rate at 43 today.  Discussed with Dr. Curt Bears.  Will decrease amiodarone to 200 mg daily but continue low-dose metoprolol 12.5 mg twice daily.  Patient will call if he has any symptoms of dizziness or fatigue.  Follow-up with Dr. Angelena Form in 1 month.  Diabetes mellitus with hemoglobin A1c of 8.5.  His sugars have been running better at home.   Medication Adjustments/Labs and Tests Ordered: Current medicines are reviewed at length with the patient today.  Concerns regarding medicines are outlined above.  Medication changes, Labs and Tests ordered today are listed in the Patient Instructions below. There are no Patient Instructions on file for this visit.   Sumner Boast, PA-C  10/22/2017 12:05 PM    [facility] [address], [place], [place]  [address] Phone: [phone]; Fax: [phone]

## 2017-10-22 NOTE — Patient Instructions (Addendum)
Medication Instructions:  Your physician has recommended you make the following change in your medication:   DECREASE: amiodarone to 200 mg once a day  If you need a refill on your cardiac medications before your next appointment, please call your pharmacy.   Lab work: Please have your fasting lipids and liver function panel checked at your Primary Care Provider's office  If you have labs (blood work) drawn today and your tests are completely normal, you will receive your results only by: Marland Kitchen MyChart Message (if you have MyChart) OR . A paper copy in the mail If you have any lab test that is abnormal or we need to change your treatment, we will call you to review the results.  Testing/Procedures: None ordered  Follow-Up: . With Dr. Angelena Form on 11/22/17 at 1:40 PM  Any Other Special Instructions Will Be Listed Below (If Applicable).

## 2017-10-25 ENCOUNTER — Ambulatory Visit: Payer: Medicare HMO | Admitting: Cardiology

## 2017-10-30 ENCOUNTER — Other Ambulatory Visit: Payer: Self-pay | Admitting: Physician Assistant

## 2017-10-31 ENCOUNTER — Other Ambulatory Visit: Payer: Self-pay | Admitting: Thoracic Surgery (Cardiothoracic Vascular Surgery)

## 2017-10-31 DIAGNOSIS — Z951 Presence of aortocoronary bypass graft: Secondary | ICD-10-CM

## 2017-11-04 ENCOUNTER — Encounter: Payer: Self-pay | Admitting: Surgical

## 2017-11-04 ENCOUNTER — Ambulatory Visit
Admission: RE | Admit: 2017-11-04 | Discharge: 2017-11-04 | Disposition: A | Discharge status: Home / Self Care | Payer: Medicare HMO | Source: Ambulatory Visit | Attending: Thoracic Surgery (Cardiothoracic Vascular Surgery) | Admitting: Thoracic Surgery (Cardiothoracic Vascular Surgery)

## 2017-11-04 ENCOUNTER — Ambulatory Visit (INDEPENDENT_AMBULATORY_CARE_PROVIDER_SITE_OTHER): Payer: Self-pay | Admitting: Surgical

## 2017-11-04 VITALS — BP 120/76 | HR 43 | Resp 20 | Ht 71.0 in | Wt 200.0 lb

## 2017-11-04 DIAGNOSIS — Z952 Presence of prosthetic heart valve: Secondary | ICD-10-CM

## 2017-11-04 DIAGNOSIS — I25110 Atherosclerotic heart disease of native coronary artery with unstable angina pectoris: Secondary | ICD-10-CM

## 2017-11-04 DIAGNOSIS — Z951 Presence of aortocoronary bypass graft: Secondary | ICD-10-CM

## 2017-11-04 DIAGNOSIS — I350 Nonrheumatic aortic (valve) stenosis: Secondary | ICD-10-CM

## 2017-11-04 NOTE — Progress Notes (Signed)
[address]       [place] [address]             [phone]      Erik Keith Medical Record #[id number] Date of Birth: 07/26/1940  Referring: Jettie Booze, MD Primary Care: Raina Mina., MD Primary Cardiologist: Lauree Chandler, MD   Chief Complaint:   POST OP FOLLOW UP Date of Procedure:                10/03/2017  Preoperative Diagnosis:       ? Severe Aortic Stenosis ? Severe Multi-vessel Coronary Artery Disease ? S/P Acute ST Segment Elevation Myocardial Infarction  Postoperative Diagnosis:    Same  Procedure:        Aortic Valve Replacement             Edwards Inspiris Resilia Stented Bovine Pericardial Tissue Valve (size 38mm, ref # [id number], serial # [id number])   Coronary Artery Bypass Grafting x 4             Left Internal Mammary Artery to Distal Left Anterior Descending Coronary Artery             Saphenous Vein Graft to Posterior Descending Coronary Artery             Saphenous Vein Graft to First Obtuse Marginal Branch of Left Circumflex Coronary Artery             Sequential Saphenous Vein Graft to Second Obtuse Marginal Branch of Left Circumflex Coronary             Endoscopic Vein Harvest from Right Thigh and Lower Leg  Surgeon:        Valentina Gu. Roxy Manns, MD  Assistant:       Ellwood Handler, PA-C  Anesthesia:    Suzette Battiest, MD  Operative Findings: ? Severe aortic stenosis ? Normal left ventricular systolic function ? Good quality left internal mammary artery conduit ? Good quality saphenous vein conduit ? Good quality target vessels for grafting   History of Present Illness:    The patient is a 77 year old male status post the above described procedure seen in the office on today's date for routine postsurgical follow-up.  Overall he is feeling as though he is doing quite well.  He continues to take his chronic pain meds for previous back surgery at the same dosage as preoperatively.   He denies fevers, chills or other significant constitutional symptoms.  He denies shortness of breath or chest pain.  He does have some sternal discomfort that is variable at times but mostly well controlled.  He has had no specific difficulties with his incisions.  He does have a mild amount of lower extremity edema.  He does notice that his heart rate is low at times sometimes as low as into the 30s but mostly in the 40s.  He is currently on amiodarone and metoprolol for postoperative rhythm management including atrial fibrillation.  Overall he and his family are quite pleased with his progress.      Past Medical History:  Diagnosis Date  . Aortic valve stenosis, severe   . CAD S/P percutaneous coronary angioplasty    12-2006  STENT TO LAD;  07-2007 DEStenting TO RCA   . GERD (gastroesophageal reflux disease)   . Hypertension   . Incidental pulmonary nodule, less than or equal to 27mm 10/02/2017   Ground glass opacities RUL seen on CTA  . Iron  deficiency anemia   . Mixed hyperlipidemia   . OA (osteoarthritis) of shoulder    LEFT SHOULDER AND AC JOINT  . 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 Type 2 diabetes mellitus (HCC)      Social History   Tobacco Use  Smoking Status Former Smoker  . Years: 40.00  . Last attempt to quit: 2002  . Years since quitting: 17.8  Smokeless Tobacco Never Used    Social History   Substance and Sexual Activity  Alcohol Use Never  . Frequency: Never     Allergies  Allergen Reactions  . Contrast Media [Iodinated Diagnostic Agents] Hives  . Penicillins Hives    Has patient had a PCN reaction causing immediate rash, facial/tongue/throat swelling, SOB or lightheadedness with hypotension: unkn Has patient had a PCN reaction causing severe rash involving  mucus membranes or skin necrosis: unkn Has patient had a PCN reaction that required hospitalization: unkn Has patient had a PCN reaction occurring within the last 10 years: no If all of the above answers are "NO", then Zollars proceed with Cephalosporin use.     Current Outpatient Medications  Medication Sig Dispense Refill  . acetaminophen (TYLENOL) 500 MG tablet Take 2 tablets (1,000 mg total) by mouth every 6 (six) hours as needed for mild pain or fever. 30 tablet 0  . amiodarone (PACERONE) 200 MG tablet Take 1 tablet (200 mg total) by mouth daily. 90 tablet 3  . aspirin EC 81 MG tablet Take 1 tablet (81 mg total) by mouth daily. 90 tablet 3  . atorvastatin (LIPITOR) 20 MG tablet Take 1 tablet (20 mg total) by mouth daily at 6 PM. 90 tablet 3  . b complex vitamins tablet Take 1 tablet by mouth 4 (four) times a week.     . clopidogrel (PLAVIX) 75 MG tablet Take 1 tablet (75 mg total) by mouth daily. 90 tablet 3  . ferrous sulfate 324 (65 Fe) MG TBEC Take 325 mg by mouth daily.     Marland Kitchen gabapentin (NEURONTIN) 300 MG capsule Take 600 mg by mouth at bedtime.     Marland Kitchen glipiZIDE (GLUCOTROL XL) 5 MG 24 hr tablet Take 5 mg by mouth 2 (two) times daily.    . Glucos-Chond-Hyal Ac-Ca Fructo (MOVE FREE JOINT HEALTH ADVANCE PO) Take 1 tablet by mouth 3 (three) times a week.     Marland Kitchen HYDROcodone-acetaminophen (NORCO) 10-325 MG tablet Take 1 tablet by mouth 2 (two) times daily.     . metFORMIN (GLUCOPHAGE) 500 MG tablet Take 500 mg by mouth 2 (two) times daily.     . metoprolol tartrate (LOPRESSOR) 25 MG tablet Take 0.5 tablets (12.5 mg total) by mouth 2 (two) times daily. 90 tablet 3  . nitroGLYCERIN (NITROSTAT) 0.4 MG SL tablet Place 1 tablet (0.4 mg total) under the tongue every 5 (five) minutes as needed for chest pain. 25 tablet 3  . oxyCODONE 10 MG TABS Take 1 tablet (10 mg total) by mouth every 4 (four) hours as needed for severe pain. 30 tablet 0  . pantoprazole (PROTONIX) 40 MG tablet Take 40 mg by mouth at  bedtime.     . Polyethylene Glycol 3350 (MIRALAX PO) Take 17 g by mouth daily. WITH COFFEE    . zolpidem (AMBIEN) 10 MG  tablet Take 10 mg by mouth at bedtime as needed for sleep.     No current facility-administered medications for this visit.        Physical Exam: Resp 20   Ht 5\' 11"  (1.803 m)   Wt 90.7 kg   SpO2 96% Comment: RA  BMI 27.89 kg/m   General appearance: alert, cooperative and no distress Heart: regular rate and rhythm Lungs: Minimally diminished in the lower bases Abdomen: Benign Extremities: Trace bilateral lower extremity edema Wound: Incisions well-healed without evidence of infection   Diagnostic Studies & Laboratory data:     Recent Radiology Findings:   Dg Chest 2 View  Result Date: 11/04/2017 CLINICAL DATA:  Postop CABG and aortic valve replacement 10/03/2017. Mild shortness of breath. EXAM: CHEST - 2 VIEW COMPARISON:  10/08/2017, 10/07/2017 and earlier. FINDINGS: Sternotomy for CABG and aortic valve replacement. Cardiac silhouette normal in size, unchanged. Thoracic aorta atherosclerotic, unchanged. Hilar and mediastinal contours otherwise unremarkable. Normal pulmonary vascularity. Persistent small BILATERAL pleural effusions and associated mild passive atelectasis in the lower lobes, unchanged. No new pulmonary parenchymal abnormalities. Mild degenerative changes involving the thoracic and UPPER lumbar spine. IMPRESSION: Persistent small BILATERAL pleural effusions and mild passive atelectasis in the lower lobes. No acute cardiopulmonary disease otherwise. Electronically Signed   By: Evangeline Dakin M.D.   On: 11/04/2017 14:35      Recent Lab Findings: Lab Results  Component Value Date   WBC 13.8 (H) 10/08/2017   HGB 12.2 (L) 10/08/2017   HCT 38.5 (L) 10/08/2017   PLT 293 10/08/2017   GLUCOSE 165 (H) 10/08/2017   ALT 40 10/02/2017   AST 44 (H) 10/02/2017   NA 138 10/08/2017   K 3.6 10/08/2017   CL 100 10/08/2017   CREATININE 1.13 10/08/2017    BUN 19 10/08/2017   CO2 27 10/08/2017   INR 1.59 10/03/2017   HGBA1C 8.5 (H) 10/02/2017      Assessment / Plan: Overall patient continues to make excellent postoperative recovery following his aortic valve replacement and CABG.  I am concerned that his heart rate is running too low and have told him to discontinue his metoprolol at this time.  He does have follow-up scheduled with cardiology and his amiodarone dose was recently reduced to 200 mg once daily.  If his heart rate continues to run low we [place] have to stop the amiodarone.  We discussed trying to improve control of his blood sugars with nutrition management and he understands the role of lower carbohydrate/sugars in the diet to hopefully improve glycemic control.  He seems to be motivated in this regard as well.  We discussed activity progression and driving restrictions.  We will see again in the office in 2 months with a repeat chest x-ray.  Today's x-ray was reviewed and the report is listed above.  He does have a very small pleural effusions which do not require intervention at this time.          John Giovanni, PA-C 11/04/2017 3:01 PM

## 2017-11-04 NOTE — Patient Instructions (Signed)
Discussed activity progression including lifting restrictions and driving.

## 2017-11-22 ENCOUNTER — Ambulatory Visit: Payer: Medicare HMO | Admitting: Cardiovascular Disease

## 2017-11-22 ENCOUNTER — Encounter: Payer: Self-pay | Admitting: Cardiovascular Disease

## 2017-11-22 VITALS — BP 132/64 | HR 69 | Ht 71.0 in | Wt 194.2 lb

## 2017-11-22 DIAGNOSIS — I480 Paroxysmal atrial fibrillation: Secondary | ICD-10-CM | POA: Diagnosis not present

## 2017-11-22 DIAGNOSIS — I350 Nonrheumatic aortic (valve) stenosis: Secondary | ICD-10-CM

## 2017-11-22 DIAGNOSIS — E782 Mixed hyperlipidemia: Secondary | ICD-10-CM | POA: Diagnosis not present

## 2017-11-22 DIAGNOSIS — I2510 Atherosclerotic heart disease of native coronary artery without angina pectoris: Secondary | ICD-10-CM | POA: Diagnosis not present

## 2017-11-22 MED ORDER — METOPROLOL SUCCINATE ER 25 MG PO TB24: 25.0000 mg | ORAL_TABLET | Freq: Every day | ORAL | 2 refills | Status: DC

## 2017-11-22 NOTE — Progress Notes (Signed)
Chief Complaint  Patient presents with  . Follow-up    CAD   History of Present Illness:77 yo male with history of severe aortic stenosis, CAD, GERD, HTN, hyperlipidemia, iron deficiency anemia, arthritis, diabetes who is here today for follow up. I saw him as a new patient for further discussion regarding his aortic stenosis and possible TAVR 09/05/17. He had been followed in the past by Dr. Bettina Gavia in [place] but most recently had been seen by Dr.McGukin in [facility] when Dr. Bettina Gavia left that practice. Echo June 2019 at [facility] with normal LV systolic function, PPJK=93-26%, mild LVH, severe aortic stenosis with mean gradient 45 mmHg and AVA 0.61 cm2. There was mild aortic valve insufficiency. He was at [facility] August 2019 to have shoulder surgery and given his severe aortic stenosis his shoulder surgery was cancelled. He also has CAD and has had a stent placed in the LAD in 2008 and a stent placed in the RCA in 2009. Last cardiac cath April 2017 with patent stents and mild non-obstructive CAD otherwise. Workup for TAVR was started when I saw him in September 2019. Cardiac cath 09/12/17 with severe three vessel CAD. We discussed CABG and surgical AVR. He was seen by Dr. Roxy Manns with CT surgery 09/20/17 and surgical AVR withi CABG was planned. He then presented to [facility] 10/02/17 with an acute lateral STEMI. This occurred after finding his son dead. His Circumflex was occluded. His Circumflex was treated with balloon angioplasty and emergent 4V CABG and AVR was performed 10/03/17. Beta blocker stopped post op due to bradycardia. He had post-op atrial fibrillation treated with amiodarone. He was in sinus at time of discharge.   He is here today for follow up. The patient denies any chest pain, dyspnea, palpitations, lower extremity edema, orthopnea, PND, dizziness, near syncope or syncope. He feels great. He wants to exercise.    Primary Care Physician: Raina Mina., MD   Past Medical History:    Diagnosis Date  . Aortic valve stenosis, severe   . CAD S/P percutaneous coronary angioplasty    12-2006  STENT TO LAD;  07-2007 DEStenting TO RCA   . GERD (gastroesophageal reflux disease)   . Hypertension   . Incidental pulmonary nodule, less than or equal to 20mm 10/02/2017   Ground glass opacities RUL seen on CTA  . Iron deficiency anemia   . Mixed hyperlipidemia   . OA (osteoarthritis) of shoulder    LEFT SHOULDER AND AC JOINT  . 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 Type 2 diabetes mellitus ([place])     Past Surgical History:  Procedure Laterality Date  . AORTIC VALVE REPLACEMENT N/A 10/03/2017   Procedure: AORTIC VALVE REPLACEMENT (AVR);  Surgeon: Rexene Alberts, MD;  Location: [place];  Service: Open Heart Surgery;  Laterality: N/A;  . APPENDECTOMY    . BACK SURGERY  x3  . CORONARY ARTERY BYPASS GRAFT N/A 10/03/2017   Procedure: CORONARY ARTERY BYPASS GRAFTING (CABG)x three, USING LEFT INTERNAL MAMMARY ARTERY AND RIGHT GREATER SAPHENOUS VEIN HARVESTED ENDOSCOPICALLY;  Surgeon: Rexene Alberts, MD;  Location: [place];  Service: Open Heart Surgery;  Laterality: N/A;  . CORONARY/GRAFT ACUTE MI REVASCULARIZATION N/A 10/02/2017   Procedure: Coronary/Graft Acute MI Revascularization;  Surgeon: Jettie Booze,  MD;  Location: [place] CV [facility];  Service: Cardiovascular;  Laterality: N/A;  . LEFT HEART CATH AND CORONARY ANGIOGRAPHY N/A 10/02/2017   Procedure: LEFT HEART CATH AND CORONARY ANGIOGRAPHY;  Surgeon: Jettie Booze, MD;  Location: [place] [facility];  Service: Cardiovascular;  Laterality: N/A;  . RIGHT/LEFT HEART CATH AND CORONARY ANGIOGRAPHY N/A 09/12/2017   Procedure: RIGHT/LEFT HEART CATH AND CORONARY ANGIOGRAPHY;  Surgeon: Burnell Blanks, MD;  Location: [place]  CV LAB;  Service: Cardiovascular;  Laterality: N/A;  . TONSILLECTOMY      Current Outpatient Medications  Medication Sig Dispense Refill  . acetaminophen (TYLENOL) 500 MG tablet Take 2 tablets (1,000 mg total) by mouth every 6 (six) hours as needed for mild pain or fever. 30 tablet 0  . aspirin EC 81 MG tablet Take 1 tablet (81 mg total) by mouth daily. 90 tablet 3  . atorvastatin (LIPITOR) 20 MG tablet Take 1 tablet (20 mg total) by mouth daily at 6 PM. 90 tablet 3  . b complex vitamins tablet Take 1 tablet by mouth 4 (four) times a week.     . clopidogrel (PLAVIX) 75 MG tablet Take 1 tablet (75 mg total) by mouth daily. 90 tablet 3  . ferrous sulfate 324 (65 Fe) MG TBEC Take 325 mg by mouth daily.     Marland Kitchen gabapentin (NEURONTIN) 300 MG capsule Take 600 mg by mouth at bedtime.     Marland Kitchen glipiZIDE (GLUCOTROL XL) 5 MG 24 hr tablet Take 5 mg by mouth 2 (two) times daily.    . Glucos-Chond-Hyal Ac-Ca Fructo (MOVE FREE JOINT HEALTH ADVANCE PO) Take 1 tablet by mouth 3 (three) times a week.     Marland Kitchen HYDROcodone-acetaminophen (NORCO) 10-325 MG tablet Take 1 tablet by mouth 2 (two) times daily.     . metFORMIN (GLUCOPHAGE) 500 MG tablet Take 500 mg by mouth 2 (two) times daily.     . nitroGLYCERIN (NITROSTAT) 0.4 MG SL tablet Place 1 tablet (0.4 mg total) under the tongue every 5 (five) minutes as needed for chest pain. 25 tablet 3  . pantoprazole (PROTONIX) 40 MG tablet Take 40 mg by mouth at bedtime.     . Polyethylene Glycol 3350 (MIRALAX PO) Take 17 g by mouth daily. WITH COFFEE    . zolpidem (AMBIEN) 10 MG tablet Take 10 mg by mouth at bedtime as needed for sleep.    . metoprolol succinate (TOPROL XL) 25 MG 24 hr tablet Take 1 tablet (25 mg total) by mouth daily. 90 tablet 2   No current facility-administered medications for this visit.     Allergies  Allergen Reactions  . Contrast Media [Iodinated Diagnostic Agents] Hives  . Penicillins Hives    Has patient had a PCN reaction causing immediate  rash, facial/tongue/throat swelling, SOB or lightheadedness with hypotension: unkn Has patient had a PCN reaction causing severe rash involving mucus membranes or skin necrosis: unkn Has patient had a PCN reaction that required hospitalization: unkn Has patient had a PCN reaction occurring within the last 10 years: no If all of the above answers are "NO", then [place] proceed with Cephalosporin use.     Social History   Socioeconomic History  . Marital status: Married    Spouse name: Lenell Antu  . Number of children: 2  . Years of education: Not on file  . Highest education level: Not on file  Occupational History  . Occupation: Retired-worked at [employer]  . Financial resource strain:  Not on file  . Food insecurity:    Worry: Not on file    Inability: Not on file  . Transportation needs:    Medical: Not on file    Non-medical: Not on file  Tobacco Use  . Smoking status: Former Smoker    Years: 40.00    Last attempt to quit: 2002    Years since quitting: 17.9  . Smokeless tobacco: Never Used  Substance and Sexual Activity  . Alcohol use: Never    Frequency: Never  . Drug use: Never  . Sexual activity: Not on file  Lifestyle  . Physical activity:    Days per week: Not on file    Minutes per session: Not on file  . Stress: Not on file  Relationships  . Social connections:    Talks on phone: Not on file    Gets together: Not on file    Attends religious service: Not on file    Active member of club or organization: Not on file    Attends meetings of clubs or organizations: Not on file    Relationship status: Not on file  . Intimate partner violence:    Fear of current or ex partner: Not on file    Emotionally abused: Not on file    Physically abused: Not on file    Forced sexual activity: Not on file  Other Topics Concern  . Not on file  Social History Narrative  . Not on file    Family History  Problem Relation Age of Onset  . CVA Father   .  Heart attack Sister   . Heart attack Brother        3 brothers with CAD    Review of Systems:  As stated in the HPI and otherwise negative.   BP 132/64   Pulse 69   Ht 5\' 11"  (1.803 m)   Wt 194 lb 3.2 oz (88.1 kg)   SpO2 96%   BMI 27.09 kg/m   Physical Examination: General: Well developed, well nourished, NAD  HEENT: OP clear, mucus membranes moist  SKIN: warm, dry. No rashes. Neuro: No focal deficits  Musculoskeletal: Muscle strength 5/5 all ext  Psychiatric: Mood and affect normal  Neck: No JVD, no carotid bruits, no thyromegaly, no lymphadenopathy.  Lungs:Clear bilaterally, no wheezes, rhonci, crackles Cardiovascular: Regular rate and rhythm. No murmurs, gallops or rubs. Abdomen:Soft. Bowel sounds present. Non-tender.  Extremities: No lower extremity edema. Pulses are 2 + in the bilateral DP/PT.  EKG:  EKG is ordered today. The ekg ordered today demonstrates NSR, rate 69 bpm. LAE. T wave inversions inferior, lateral and anterolateral leads.   Recent Labs: 10/02/2017: ALT 40 10/08/2017: BUN 19; Creatinine, Ser 1.13; Hemoglobin 12.2; Magnesium 2.3; Platelets 293; Potassium 3.6; Sodium 138   Lipid Panel No results found for: CHOL, TRIG, HDL, CHOLHDL, VLDL, LDLCALC, LDLDIRECT   Wt Readings from Last 3 Encounters:  11/22/17 194 lb 3.2 oz (88.1 kg)  11/04/17 200 lb (90.7 kg)  10/22/17 196 lb 6.4 oz (89.1 kg)     Other studies Reviewed: Additional studies/ records that were reviewed today include: .office records, EKG, echo report Review of the above records demonstrates:    Assessment and Plan:   1. Severe aortic valve stenosis: He is s/p AVR on 10/03/17. Continue ASA. SBE prophylaxis when indicated.   2. CAD s/p CABG without angina: Doing well. No chest pain. Continue ASA, Plavix, beta blocker and statin  3. Post-op atrial fibrillation: Sinus today.  Will restart Toprol 25 mg daily. Will stop amiodarone with no recurrence of atrial fibrillation. Will not start  anti-coagulation.   4. Hyperlipidemia: Continue statin.   Current medicines are reviewed at length with the patient today.  The patient does not have concerns regarding medicines.  The following changes have been made:  no change  Labs/ tests ordered today include:   Orders Placed This Encounter  Procedures  . EKG 12-Lead     Disposition:   FU with me in 6 months   Signed, Lauree Chandler, MD 11/22/2017 2:26 PM    [address], [place], [place]  [address] Phone: [phone]; Fax: [phone]

## 2017-11-22 NOTE — Patient Instructions (Signed)
Medication Instructions:  Your physician has recommended you make the following change in your medication:  Stop amiodarone. Start Toprol 25 mg by mouth daily.   If you need a refill on your cardiac medications before your next appointment, please call your pharmacy.   Lab work: none If you have labs (blood work) drawn today and your tests are completely normal, you will receive your results only by: Marland Kitchen MyChart Message (if you have MyChart) OR . A paper copy in the mail If you have any lab test that is abnormal or we need to change your treatment, we will call you to review the results.  Testing/Procedures: none  Follow-Up: At [facility], you and your health needs are our priority.  As part of our continuing mission to provide you with exceptional heart care, we have created designated Provider Care Teams.  These Care Teams include your primary Cardiologist (physician) and Advanced Practice Providers (APPs -  Physician Assistants and Nurse Practitioners) who all work together to provide you with the care you need, when you need it. You will need a follow up appointment in 6 months.  Please call our office 2 months in advance to schedule this appointment.  You Inch see Lauree Chandler, MD or one of the following Advanced Practice Providers on your designated Care Team:   [place], PA-C Melina Copa, PA-C . Ermalinda Barrios, PA-C  Any Other Special Instructions Will Be Listed Below (If Applicable).

## 2018-01-02 ENCOUNTER — Telehealth: Payer: Self-pay

## 2018-01-02 NOTE — Telephone Encounter (Signed)
Mr. Hertzberg contacted the office questioning if he can take Prednisone if prescribed when going to his PCP tomorrow for a sore throat.  He stated that "they usually put me on it when I have a sore throat".  I advised him that there is no contraindications to take prednisone if prescribed with his current medications.  However, his PCP would advise what he needs/ will prescribe for the sore throat.  Patient acknowledged receipt.

## 2018-01-06 ENCOUNTER — Ambulatory Visit: Payer: Medicare HMO | Admitting: Thoracic Surgery (Cardiothoracic Vascular Surgery)

## 2018-01-11 ENCOUNTER — Emergency Department (HOSPITAL_COMMUNITY)
Admission: EM | Admit: 2018-01-11 | Discharge: 2018-01-11 | Disposition: A | Discharge status: Home / Self Care | Payer: Medicare HMO | Attending: Emergency Medicine | Admitting: Emergency Medicine

## 2018-01-11 ENCOUNTER — Encounter (HOSPITAL_COMMUNITY): Payer: Self-pay

## 2018-01-11 DIAGNOSIS — R001 Bradycardia, unspecified: Secondary | ICD-10-CM | POA: Insufficient documentation

## 2018-01-11 DIAGNOSIS — Z87891 Personal history of nicotine dependence: Secondary | ICD-10-CM | POA: Diagnosis not present

## 2018-01-11 DIAGNOSIS — I10 Essential (primary) hypertension: Secondary | ICD-10-CM | POA: Diagnosis not present

## 2018-01-11 DIAGNOSIS — Z7982 Long term (current) use of aspirin: Secondary | ICD-10-CM | POA: Insufficient documentation

## 2018-01-11 DIAGNOSIS — Z7984 Long term (current) use of oral hypoglycemic drugs: Secondary | ICD-10-CM | POA: Insufficient documentation

## 2018-01-11 DIAGNOSIS — E119 Type 2 diabetes mellitus without complications: Secondary | ICD-10-CM | POA: Insufficient documentation

## 2018-01-11 DIAGNOSIS — Z79899 Other long term (current) drug therapy: Secondary | ICD-10-CM | POA: Insufficient documentation

## 2018-01-11 NOTE — Discharge Instructions (Addendum)
Reduce your metoprolol dose to 12.5 daily.  Follow-up with your cardiologist and cardiothoracic surgeon as discussed.  Return to the ED if you become symptomatic with lightheadedness, weakness, dizziness.

## 2018-01-11 NOTE — ED Provider Notes (Signed)
[place] EMERGENCY DEPARTMENT Provider Note   CSN: [id number] Arrival date & time: 01/11/18  1155     History   Chief Complaint Chief Complaint  Patient presents with  . Bradycardia    HPI Erik Keith is a 78 y.o. male.  Patient sent from primary care doctor for bradycardia.  Patient found to have sinus bradycardia in the 40s and was sent for evaluation.  Patient is asymptomatic.  No chest pain.  Patient has some low back pain from a fall several days ago.  He states that he lost his balance.  No loss of consciousness.  Does not have any dizziness, headache, shortness of breath, chest pain upon my evaluation.  Has not felt lightheaded or dizzy while ambulating over the last several days.  Currently patient is on metoprolol 25 mg daily that was recently increased from 12.5.  The history is provided by the patient.  Illness  Location:  General Severity:  Mild Onset quality:  Gradual Timing:  Constant Progression:  Unchanged Chronicity:  Chronic Associated symptoms: no abdominal pain, no chest pain, no congestion, no cough, no ear pain, no fever, no rash, no rhinorrhea, no shortness of breath, no sore throat and no vomiting     Past Medical History:  Diagnosis Date  . Aortic valve stenosis, severe   . CAD S/P percutaneous coronary angioplasty    12-2006  STENT TO LAD;  07-2007 DEStenting TO RCA   . GERD (gastroesophageal reflux disease)   . Hypertension   . Incidental pulmonary nodule, less than or equal to 16mm 10/02/2017   Ground glass opacities RUL seen on CTA  . Iron deficiency anemia   . Mixed hyperlipidemia   . OA (osteoarthritis) of shoulder    LEFT SHOULDER AND AC JOINT  . 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 Type 2 diabetes mellitus [facility])      Patient Active Problem List   Diagnosis Date Noted  . Postoperative atrial fibrillation ([place]) 10/22/2017  . Diabetes mellitus ([place]) 10/22/2017  . 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 Incidental pulmonary nodule, less than or equal to 80mm 10/02/2017  . STEMI involving left circumflex coronary artery ([place]) 10/02/2017  . Acute inferoposterior myocardial infarction ([place]) 10/02/2017  . Essential hypertension   . Mixed hyperlipidemia   . Coronary artery disease involving native coronary artery of native heart with unstable angina pectoris ([place])   . Severe aortic stenosis     Past Surgical History:  Procedure Laterality Date  . AORTIC VALVE REPLACEMENT N/A 10/03/2017   Procedure: AORTIC VALVE REPLACEMENT (AVR);  Surgeon: Rexene Alberts, MD;  Location: [place];  Service: Open Heart Surgery;  Laterality: N/A;  . APPENDECTOMY    . BACK SURGERY  x3  . CORONARY ARTERY BYPASS GRAFT N/A 10/03/2017   Procedure: CORONARY ARTERY BYPASS GRAFTING (CABG)x three, USING LEFT INTERNAL MAMMARY ARTERY AND RIGHT GREATER SAPHENOUS VEIN HARVESTED ENDOSCOPICALLY;  Surgeon: Rexene Alberts, MD;  Location: [place];  Service: Open Heart Surgery;  Laterality: N/A;  . CORONARY/GRAFT ACUTE MI REVASCULARIZATION N/A 10/02/2017   Procedure: Coronary/Graft Acute MI Revascularization;  Surgeon: Jettie Booze, MD;  Location: [place] [facility];  Service: Cardiovascular;  Laterality: N/A;  . LEFT HEART CATH AND CORONARY ANGIOGRAPHY N/A 10/02/2017   Procedure: LEFT HEART CATH AND CORONARY ANGIOGRAPHY;  Surgeon: Jettie Booze, MD;  Location: [place] [facility];  Service: Cardiovascular;  Laterality: N/A;  . RIGHT/LEFT HEART CATH AND CORONARY ANGIOGRAPHY N/A 09/12/2017   Procedure: RIGHT/LEFT HEART CATH AND CORONARY ANGIOGRAPHY;  Surgeon: Burnell Blanks, MD;  Location: [place] [facility];  Service: Cardiovascular;  Laterality: N/A;  .  TONSILLECTOMY          Home Medications    Prior to Admission medications   Medication Sig Start Date End Date Taking? Authorizing Provider  acetaminophen (TYLENOL) 500 MG tablet Take 2 tablets (1,000 mg total) by mouth every 6 (six) hours as needed for mild pain or fever. 10/08/17   Barrett, Lodema Hong, PA-C  aspirin EC 81 MG tablet Take 1 tablet (81 mg total) by mouth daily. 10/22/17   Imogene Burn, PA-C  atorvastatin (LIPITOR) 20 MG tablet Take 1 tablet (20 mg total) by mouth daily at 6 PM. 10/22/17   Imogene Burn, PA-C  b complex vitamins tablet Take 1 tablet by mouth 4 (four) times a week.     [provider]  clopidogrel (PLAVIX) 75 MG tablet Take 1 tablet (75 mg total) by mouth daily. 10/22/17   Imogene Burn, PA-C  ferrous sulfate 324 (65 Fe) MG TBEC Take 325 mg by mouth daily.     [provider]  gabapentin (NEURONTIN) 300 MG capsule Take 600 mg by mouth at bedtime.     [provider]  glipiZIDE (GLUCOTROL XL) 5 MG 24 hr tablet Take 5 mg by mouth 2 (two) times daily.    [provider]  Glucos-Chond-Hyal Ac-Ca Fructo (MOVE FREE JOINT HEALTH ADVANCE PO) Take 1 tablet by mouth 3 (three) times a week.     [provider]  HYDROcodone-acetaminophen (NORCO) 10-325 MG tablet Take 1 tablet by mouth 2 (two) times daily.     [provider]  metFORMIN (GLUCOPHAGE) 500 MG tablet Take 500 mg by mouth 2 (two) times daily.     [provider]  metoprolol succinate (TOPROL XL) 25 MG 24 hr tablet Take 1 tablet (25 mg total) by mouth daily. 11/22/17   Burnell Blanks, MD  nitroGLYCERIN (NITROSTAT) 0.4 MG SL tablet Place 1 tablet (0.4 mg total) under the tongue every 5 (five) minutes as needed for chest pain. 10/22/17   Imogene Burn, PA-C  pantoprazole (PROTONIX) 40 MG tablet Take 40 mg by mouth at bedtime.     [provider]  Polyethylene Glycol 3350 (MIRALAX PO) Take 17 g by mouth daily. WITH COFFEE     [provider]  zolpidem (AMBIEN) 10 MG tablet Take 10 mg by mouth at bedtime as needed for sleep.    [provider]    Family History Family History  Problem Relation Age of Onset  . CVA Father   . Heart attack Sister   . Heart attack Brother        3 brothers with CAD    Social History Social History   Tobacco Use  . Smoking status: Former Smoker    Years: 40.00    Last attempt to quit: 2002    Years since quitting: 18.0  . Smokeless tobacco: Never Used  Substance Use Topics  . Alcohol use: Never    Frequency: Never  . Drug use: Never     Allergies   Contrast  media [iodinated diagnostic agents] and Penicillins   Review of Systems Review of Systems  Constitutional: Negative for chills and fever.  HENT: Negative for congestion, ear pain, rhinorrhea and sore throat.   Eyes: Negative for pain and visual disturbance.  Respiratory: Negative for cough and shortness of breath.   Cardiovascular: Negative for chest pain and palpitations.  Gastrointestinal: Negative for abdominal pain and vomiting.  Genitourinary: Negative for dysuria and hematuria.  Musculoskeletal: Negative for arthralgias and back pain.  Skin: Negative for color change and rash.  Neurological: Negative for seizures and syncope.  All other systems reviewed and are negative.    Physical Exam Updated Vital Signs  ED Triage Vitals  Enc Vitals Group     BP 01/11/18 1331 (!) 170/79     Pulse Rate 01/11/18 1331 (!) 43     Resp 01/11/18 1331 16     Temp --      Temp src --      SpO2 01/11/18 1331 98 %     Weight 01/11/18 1220 198 lb (89.8 kg)     Height 01/11/18 1220 5\' 11"  (1.803 m)     Head Circumference --      Peak Flow --      Pain Score 01/11/18 1219 8     Pain Loc --      Pain Edu? --      Excl. in [place]? --     Physical Exam Vitals signs and nursing note reviewed.  Constitutional:      General: He is not in acute distress.    Appearance: He is well-developed. He is  not ill-appearing.  HENT:     Head: Normocephalic and atraumatic.     Nose: Nose normal.     Mouth/Throat:     Mouth: Mucous membranes are moist.  Eyes:     Extraocular Movements: Extraocular movements intact.     Conjunctiva/sclera: Conjunctivae normal.     Pupils: Pupils are equal, round, and reactive to light.  Neck:     Musculoskeletal: Neck supple.  Cardiovascular:     Rate and Rhythm: Regular rhythm. Bradycardia present.     Pulses: Normal pulses.     Heart sounds: Normal heart sounds. No murmur.  Pulmonary:     Effort: No respiratory distress.     Breath sounds: Normal breath sounds.  Abdominal:     General: There is no distension.     Palpations: Abdomen is soft.     Tenderness: There is no abdominal tenderness.  Musculoskeletal: Normal range of motion.  Skin:    General: Skin is warm and dry.     Capillary Refill: Capillary refill takes less than 2 seconds.  Neurological:     General: No focal deficit present.     Mental Status: He is alert and oriented to person, place, and time.     Cranial Nerves: No cranial nerve deficit.     Sensory: No sensory deficit.     Motor: No weakness.     Coordination: Coordination normal.  Psychiatric:        Mood and Affect: Mood normal.      ED Treatments / Results  Labs (all labs ordered are listed, but only abnormal results are displayed) Labs Reviewed - No data to display  EKG EKG Interpretation  Date/Time:  Saturday January 11 2018 12:23:40 EST Ventricular Rate:  47 PR Interval:    QRS Duration: 109 QT Interval:  502 QTC Calculation: 444 R Axis:   73  Text Interpretation:  Sinus bradycardia UNCHANGED FROM PRIOR Confirmed by Lennice Sites [phone]) on 01/11/2018 1:34:43 PM   Radiology No results found.  Procedures Procedures (including critical care time)  Medications Ordered in ED Medications - No data to display   Initial Impression / Assessment and Plan / ED Course  I have reviewed the triage vital  signs and the nursing notes.  Pertinent labs & imaging results that were available during my care of the patient were reviewed by me and considered in my medical decision making (see chart for details).     Erik Keith is a 78 year old male with history of coronary artery disease who presents to the ED with bradycardia.  Patient with heart rate in the 40s upon arrival.  Upon my evaluation patient had heart rate between 45 and 65.  Patient is asymptomatic.  No chest pain, no shortness of breath, no abdominal pain.  Patient went to see his primary care doctor today for chronic low back pain that got exacerbated after he hit his low back 1 week ago.  Has had over-the-counter medications with some improvement.  Has good pulses throughout.  Normal neurological exam.  Overall patient is well-appearing.  He denies any dizziness, lightheadedness while ambulating or at rest.  EKG showed sinus bradycardia at 43 bpm.  He appears to have been bradycardic in the past.  He is currently on 25 mg of metoprolol which I told him to cut in half until he can follow-up with his cardiologist which she has an appointment with next week.  Patient hemodynamically stable throughout my care and discharged in good condition.  This chart was dictated using voice recognition software.  Despite best efforts to proofread,  errors can occur which can change the documentation meaning.   Final Clinical Impressions(s) / ED Diagnoses   Final diagnoses:  Bradycardia    ED Discharge Orders    None       Lennice Sites, DO 01/11/18 1655

## 2018-01-11 NOTE — ED Triage Notes (Signed)
Pt brought in by ems from PCP for being bradycardia in 40's  At the doctors office ; pt denies any Chest pain or SOB ; pt alert and oriented x4 ; pt went to pcp for pain control from back pain caused by a fall a couple of days ago

## 2018-01-15 ENCOUNTER — Other Ambulatory Visit: Payer: Self-pay | Admitting: Thoracic Surgery (Cardiothoracic Vascular Surgery)

## 2018-01-15 DIAGNOSIS — Z951 Presence of aortocoronary bypass graft: Secondary | ICD-10-CM

## 2018-01-16 ENCOUNTER — Ambulatory Visit: Payer: Medicare HMO | Admitting: Thoracic Surgery (Cardiothoracic Vascular Surgery)

## 2018-01-16 ENCOUNTER — Ambulatory Visit
Admission: RE | Admit: 2018-01-16 | Discharge: 2018-01-16 | Disposition: A | Discharge status: Home / Self Care | Payer: Medicare HMO | Source: Ambulatory Visit | Attending: Thoracic Surgery (Cardiothoracic Vascular Surgery) | Admitting: Thoracic Surgery (Cardiothoracic Vascular Surgery)

## 2018-01-16 ENCOUNTER — Encounter: Payer: Self-pay | Admitting: Thoracic Surgery (Cardiothoracic Vascular Surgery)

## 2018-01-16 VITALS — BP 142/68 | HR 56 | Resp 20 | Ht 71.0 in | Wt 189.0 lb

## 2018-01-16 DIAGNOSIS — Z951 Presence of aortocoronary bypass graft: Secondary | ICD-10-CM

## 2018-01-16 DIAGNOSIS — I25110 Atherosclerotic heart disease of native coronary artery with unstable angina pectoris: Secondary | ICD-10-CM | POA: Diagnosis not present

## 2018-01-16 DIAGNOSIS — I350 Nonrheumatic aortic (valve) stenosis: Secondary | ICD-10-CM | POA: Diagnosis not present

## 2018-01-16 DIAGNOSIS — Z952 Presence of prosthetic heart valve: Secondary | ICD-10-CM

## 2018-01-16 NOTE — Patient Instructions (Addendum)
Continue all previous medications without any changes at this time  You Kendall resume unrestricted physical activity without any particular limitations at this time.  Endocarditis is a potentially serious infection of heart valves or inside lining of the heart.  It occurs more commonly in patients with diseased heart valves (such as patient's with aortic or mitral valve disease) and in patients who have undergone heart valve repair or replacement.  Certain surgical and dental procedures Schiffer put you at risk, such as dental cleaning, other dental procedures, or any surgery involving the respiratory, urinary, gastrointestinal tract, gallbladder or prostate gland.   To minimize your chances for develooping endocarditis, maintain good oral health and seek prompt medical attention for any infections involving the mouth, teeth, gums, skin or urinary tract.    Always notify your doctor or dentist about your underlying heart valve condition before having any invasive procedures. You will need to take antibiotics before certain procedures, including all routine dental cleanings or other dental procedures.  Your cardiologist or dentist should prescribe these antibiotics for you to be taken ahead of time.      

## 2018-01-16 NOTE — Progress Notes (Signed)
[address]       [place] [address]             [phone]     CARDIOTHORACIC SURGERY OFFICE NOTE  Referring Provider is Jettie Booze, MD  Primary Cardiologist is Burnell Blanks, MD PCP is Raina Mina., MD   HPI:  Patient is a 78 year old male with history of coronary artery disease status post PCI x2 in 2008, aortic stenosis, hypertension, hyperlipidemia, type 2 diabetes mellitus, iron deficiency anemia, GE reflux disease, and arthritis of the left shoulder who  returns the office today for routine follow-up status post emergency aortic valve replacement and coronary artery bypass grafting x4 on October 03, 2017 for severe symptomatic aortic stenosis with severe multivessel coronary artery disease status post acute ST segment elevation myocardial infarction.  At the time of the patient's presentation immediately prior to surgery he had suffered an acute lateral wall myocardial infarction secondary to acute thrombotic occlusion of the left circumflex coronary artery.  His postoperative recovery was remarkably uncomplicated although he did have some postoperative atrial fibrillation which was treated with amiodarone.  Immediately following surgery beta-blockers were held because of bradycardia.  Because his episode of atrial fibrillation was very brief he was not placed on anticoagulation.  He was treated with dual antiplatelet therapy because of his presentation with an acute myocardial infarction.  He was last seen here in our office on November 04, 2017 at which time he was doing well.  He was seen by Dr. Angelena Form on November 22, 2017.  He was maintaining sinus rhythm at that time.  Amiodarone was stopped and Toprol-XL was started.  He was seen by his primary care physician on January 10, 2018 and was noted to be bradycardic on exam.  He was sent to the emergency department where he was noted to be in sinus bradycardia.  He was reportedly entirely  asymptomatic at the time.  He was instructed to decrease his dose of Toprol-XL to 12.5 mg daily.  He did not have any blood work done.  He has not had a follow-up echocardiogram since surgery.  He returns for office today and reports that he feels fine.  He has been monitoring his pulse and he states that it tends to run between 7s in the 70s.  Denies any chest pain or shortness of breath either with or without activity.  He has not had any dizzy spells.  Overall he is delighted with his progress.   Current Outpatient Medications  Medication Sig Dispense Refill  . acetaminophen (TYLENOL) 500 MG tablet Take 2 tablets (1,000 mg total) by mouth every 6 (six) hours as needed for mild pain or fever. 30 tablet 0  . aspirin EC 81 MG tablet Take 1 tablet (81 mg total) by mouth daily. 90 tablet 3  . atorvastatin (LIPITOR) 20 MG tablet Take 1 tablet (20 mg total) by mouth daily at 6 PM. 90 tablet 3  . b complex vitamins tablet Take 1 tablet by mouth 4 (four) times a week.     . clopidogrel (PLAVIX) 75 MG tablet Take 1 tablet (75 mg total) by mouth daily. 90 tablet 3  . ferrous sulfate 324 (65 Fe) MG TBEC Take 325 mg by mouth daily.     Marland Kitchen gabapentin (NEURONTIN) 300 MG capsule Take 600 mg by mouth at bedtime.     Marland Kitchen glipiZIDE (GLUCOTROL XL) 5 MG 24 hr tablet Take 5 mg by mouth 2 (  two) times daily.    . Glucos-Chond-Hyal Ac-Ca Fructo (MOVE FREE JOINT HEALTH ADVANCE PO) Take 1 tablet by mouth 3 (three) times a week.     Marland Kitchen HYDROcodone-acetaminophen (NORCO) 10-325 MG tablet Take 1 tablet by mouth 2 (two) times daily.     . metFORMIN (GLUCOPHAGE) 500 MG tablet Take 500 mg by mouth 2 (two) times daily.     . metoprolol succinate (TOPROL XL) 25 MG 24 hr tablet Take 1 tablet (25 mg total) by mouth daily. (Patient taking differently: Take 12.5 mg by mouth daily. ) 90 tablet 2  . nitroGLYCERIN (NITROSTAT) 0.4 MG SL tablet Place 1 tablet (0.4 mg total) under the tongue every 5 (five) minutes as needed for chest pain. 25  tablet 3  . pantoprazole (PROTONIX) 40 MG tablet Take 40 mg by mouth at bedtime.     . Polyethylene Glycol 3350 (MIRALAX PO) Take 17 g by mouth daily. WITH COFFEE    . zolpidem (AMBIEN) 10 MG tablet Take 10 mg by mouth at bedtime as needed for sleep.     No current facility-administered medications for this visit.       Physical Exam:   BP (!) 142/68   Pulse (!) 56   Resp 20   Ht 5\' 11"  (1.803 m)   Wt 189 lb (85.7 kg)   SpO2 95% Comment: RA  BMI 26.36 kg/m   General:  Well-appearing  Chest:   Clear to auscultation  CV:   Regular rate and rhythm without murmur  Incisions:  Completely healed  Abdomen:  Soft nontender  Extremities:  Warm and well-perfused  Diagnostic Tests:  CHEST - 2 VIEW  COMPARISON:  01/10/2018  FINDINGS: Prior CABG and valve replacement. Heart is normal size. Linear scarring at the left base. Right lung clear. No effusions or acute bony abnormality.  IMPRESSION: Left basilar scarring.  No active disease.   Electronically Signed   By: Rolm Baptise M.D.   On: 01/16/2018 10:43    Impression:  Patient is doing very well approximately 3 months status post emergency aortic valve replacement and coronary artery bypass grafting.  Plan:  I have encouraged the patient to continue to increase his physical activity without any particular limitations at this time.  We have not recommended any changes in the patient's current medications, although I have suggested that the patient continue to monitor his pulse intermittently.  I also suggested that he could consider taking his Toprol-XL in the evening rather than the morning.  The patient has been reminded regarding the importance of dental hygiene and the lifelong need for antibiotic prophylaxis for all dental cleanings and other related invasive procedures.  The patient will return to our office for routine follow-up next fall, approximately 1 year following his surgery.  He will continue to follow-up  with Dr. Angelena Form, and he understands that at some point he will need to undergo routine follow-up echocardiogram.  I spent in excess of 15 minutes during the conduct of this office consultation and >50% of this time involved direct face-to-face encounter with the patient for counseling and/or coordination of their care.    Valentina Gu. Roxy Manns, MD 01/16/2018 11:38 AM

## 2018-01-20 ENCOUNTER — Telehealth: Payer: Self-pay | Admitting: Cardiovascular Disease

## 2018-01-20 DIAGNOSIS — Z952 Presence of prosthetic heart valve: Secondary | ICD-10-CM

## 2018-01-20 NOTE — Telephone Encounter (Signed)
New message      Pt stated that Dr. Ricard Dillon stated that he needs and Echo test done. No orders in epic. Pt has an appt with Richardson Dopp on 5/20  For 6 month follow up  and would like test same day since he has to travel far.

## 2018-01-20 NOTE — Telephone Encounter (Signed)
Pt had CABG and AVR in October 2019. Had TEE at that time. Will route to Dr. Angelena Form to see when next ECHO should be performed.  Pt has visit with APP on 05/21/18.

## 2018-01-21 NOTE — Telephone Encounter (Signed)
I agree that we can schedule his echo on the same day as his appointment with Richardson Dopp in [place]. Thanks, chris

## 2018-02-20 ENCOUNTER — Other Ambulatory Visit (HOSPITAL_COMMUNITY): Payer: Medicare HMO

## 2018-02-21 ENCOUNTER — Other Ambulatory Visit (HOSPITAL_COMMUNITY): Payer: Medicare HMO

## 2018-02-25 ENCOUNTER — Ambulatory Visit (HOSPITAL_COMMUNITY): Payer: Medicare HMO | Attending: Cardiovascular Disease

## 2018-02-25 DIAGNOSIS — Z952 Presence of prosthetic heart valve: Secondary | ICD-10-CM

## 2018-02-26 ENCOUNTER — Telehealth: Payer: Self-pay | Admitting: *Deleted

## 2018-02-26 NOTE — Telephone Encounter (Signed)
-----   Message from Burnell Blanks, MD sent at 02/26/2018  8:09 AM EST ----- Post surgery his LV function is normal and his aortic valve replacement is working well. Can we let him know? Thanks, chris

## 2018-02-26 NOTE — Telephone Encounter (Signed)
Left message to call office

## 2018-02-26 NOTE — Telephone Encounter (Signed)
Follow up  PT is returning phone call Please call back

## 2018-02-26 NOTE — Telephone Encounter (Signed)
I spoke with pt and reviewed echo results with him.  

## 2018-03-24 ENCOUNTER — Telehealth: Payer: Self-pay | Admitting: Cardiovascular Disease

## 2018-03-24 NOTE — Telephone Encounter (Signed)
New  Message    Pt c/o medication issue:  1. Name of Medication: Plavix and Aspirin  2. How are you currently taking this medication (dosage and times per day)? 75mg  and 81mg   3. Are you having a reaction (difficulty breathing--STAT)? no  4. What is your medication issue? Patient states he's taking Plavix and aspirin and it's starting to have bruises wants to know if he should continue to take both.

## 2018-03-24 NOTE — Telephone Encounter (Signed)
Spoke with the patient, he wanted to know if he would be able to stop one of his blood thinners. He is tired of bruising so easily.

## 2018-03-25 NOTE — Telephone Encounter (Signed)
Spoke with pt and made him aware of recommendations.  Pt appreciative for call.  

## 2018-03-25 NOTE — Telephone Encounter (Signed)
He can stop the Plavix due to easy bruising. Continue ASA.   Thanks, chris

## 2018-05-14 ENCOUNTER — Telehealth: Payer: Self-pay | Admitting: *Deleted

## 2018-05-14 NOTE — Telephone Encounter (Signed)
LVMOM TO CALL BACK  ABOUT APPT

## 2018-05-15 ENCOUNTER — Telehealth: Payer: Self-pay | Admitting: *Deleted

## 2018-05-15 NOTE — Telephone Encounter (Signed)
Follow up   Attempted to contact you by phone you were not available. Patient is returning your call. Please call.

## 2018-05-15 NOTE — Telephone Encounter (Signed)
LVMOM TO CALL BACK ABOUT APPT

## 2018-05-15 NOTE — Progress Notes (Signed)
Virtual Visit via Telephone Note   This visit type was conducted due to national recommendations for restrictions regarding the COVID-19 Pandemic (e.g. social distancing) in an effort to limit this patient's exposure and mitigate transmission in our community.  Due to his co-morbid illnesses, this patient is at least at moderate risk for complications without adequate follow up.  This format is felt to be most appropriate for this patient at this time.  The patient did not have access to video technology/had technical difficulties with video requiring transitioning to audio format only (telephone).  All issues noted in this document were discussed and addressed.  No physical exam could be performed with this format.  Please refer to the patient's chart for his  consent to telehealth for [facility].   Date:  05/16/2018   ID:  Erik Keith, DOB Jan 10, 1940, MRN [id number]  Patient Location: Home Provider Location: Home  PCP:  Raina Mina., MD  Cardiologist:  Lauree Chandler, MD   Electrophysiologist:  None   Evaluation Performed:  Follow-Up Visit  Chief Complaint:  FU on CAD, Hx of AVR  History of Present Illness:    Erik Keith is a 78 y.o. male with aortic stenosis, coronary artery disease s/p stent to the LAD in 2008 and stent to the RCA in 2009, hypertension, hyperlipidemia, iron deficiency anemia, diabetes.  Echo in 06/2017 demonstrated severe aortic stenosis and Cardiac Catheterization in 09/2017 demonstrated 3 v CAD.  Surgical AVR + CABG was planned.  But, he suffered a lateral STEMI in 10/2017.  He underwent PCI with POBA to the LCx followed by emergent CABG + AVR.  Post op course was notable for atrial fibrillation which required Amiodarone.  His beta-blocker was stopped due to bradycardia.  He was last seen by Dr. Angelena Form in 11/2017.    Today, he notes he is doing well.  He has some hypersensitivity on his left chest since his surgery.  Otherwise, he has not had  any anginal symptoms.  He has some mild shortness of breath at times.  He has not had any syncope or near syncope.  He has not had any lower extremity swelling.  He is back to playing golf and is enjoying this.  He plans to go to the beach soon.  The patient does not have symptoms concerning for COVID-19 infection (fever, chills, cough, or new shortness of breath).    Past Medical History:  Diagnosis Date  . Aortic valve stenosis, severe   . CAD S/P percutaneous coronary angioplasty    12-2006  STENT TO LAD;  07-2007 DEStenting TO RCA   . GERD (gastroesophageal reflux disease)   . Hypertension   . Incidental pulmonary nodule, less than or equal to 33mm 10/02/2017   Ground glass opacities RUL seen on CTA  . Iron deficiency anemia   . Mixed hyperlipidemia   . OA (osteoarthritis) of shoulder    LEFT SHOULDER AND AC JOINT  . 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 Type 2 diabetes mellitus ([place])    Past Surgical History:  Procedure Laterality Date  . AORTIC VALVE REPLACEMENT N/A 10/03/2017   Procedure: AORTIC VALVE REPLACEMENT (AVR);  Surgeon: Rexene Alberts, MD;  Location: [facility];  Service: Open Heart Surgery;  Laterality: N/A;  .  APPENDECTOMY    . BACK SURGERY  x3  . CORONARY ARTERY BYPASS GRAFT N/A 10/03/2017   Procedure: CORONARY ARTERY BYPASS GRAFTING (CABG)x three, USING LEFT INTERNAL MAMMARY ARTERY AND RIGHT GREATER SAPHENOUS VEIN HARVESTED ENDOSCOPICALLY;  Surgeon: Rexene Alberts, MD;  Location: [facility];  Service: Open Heart Surgery;  Laterality: N/A;  . CORONARY/GRAFT ACUTE MI REVASCULARIZATION N/A 10/02/2017   Procedure: Coronary/Graft Acute MI Revascularization;  Surgeon: Jettie Booze, MD;  Location: [facility];  Service: Cardiovascular;  Laterality: N/A;  . LEFT HEART CATH  AND CORONARY ANGIOGRAPHY N/A 10/02/2017   Procedure: LEFT HEART CATH AND CORONARY ANGIOGRAPHY;  Surgeon: Jettie Booze, MD;  Location: [facility];  Service: Cardiovascular;  Laterality: N/A;  . RIGHT/LEFT HEART CATH AND CORONARY ANGIOGRAPHY N/A 09/12/2017   Procedure: RIGHT/LEFT HEART CATH AND CORONARY ANGIOGRAPHY;  Surgeon: Burnell Blanks, MD;  Location: [facility];  Service: Cardiovascular;  Laterality: N/A;  . TONSILLECTOMY       Current Meds  Medication Sig  . acetaminophen (TYLENOL) 500 MG tablet Take 2 tablets (1,000 mg total) by mouth every 6 (six) hours as needed for mild pain or fever.  Marland Kitchen aspirin EC 81 MG tablet Take 1 tablet (81 mg total) by mouth daily.  Marland Kitchen atorvastatin (LIPITOR) 20 MG tablet Take 1 tablet (20 mg total) by mouth daily at 6 PM.  . b complex vitamins tablet Take 1 tablet by mouth 4 (four) times a week.   . ferrous sulfate 324 (65 Fe) MG TBEC Take 325 mg by mouth daily.   Marland Kitchen gabapentin (NEURONTIN) 300 MG capsule Take 600 mg by mouth at bedtime.   Marland Kitchen glipiZIDE (GLUCOTROL XL) 5 MG 24 hr tablet Take 5 mg by mouth 2 (two) times daily.  . Glucos-Chond-Hyal Ac-Ca Fructo (MOVE FREE JOINT HEALTH ADVANCE PO) Take 1 tablet by mouth 3 (three) times a week.   Marland Kitchen HYDROcodone-acetaminophen (NORCO) 10-325 MG tablet Take 1 tablet by mouth 2 (two) times daily.   . metFORMIN (GLUCOPHAGE) 500 MG tablet Take 500 mg by mouth 2 (two) times daily.   . metoprolol succinate (TOPROL XL) 25 MG 24 hr tablet Take 1 tablet (25 mg total) by mouth daily. (Patient taking differently: Take 12.5 mg by mouth daily. )  . nitroGLYCERIN (NITROSTAT) 0.4 MG SL tablet Place 1 tablet (0.4 mg total) under the tongue every 5 (five) minutes as needed for chest pain.  . pantoprazole (PROTONIX) 40 MG tablet Take 40 mg by mouth at bedtime.   . Polyethylene Glycol 3350 (MIRALAX PO) Take 17 g by mouth daily. WITH COFFEE  . zolpidem (AMBIEN) 10 MG tablet Take 10 mg by mouth at bedtime as needed  for sleep.     Allergies:   Contrast media [iodinated diagnostic agents] and Penicillins   Social History   Tobacco Use  . Smoking status: Former Smoker    Years: 40.00    Last attempt to quit: 2002    Years since quitting: 18.3  . Smokeless tobacco: Never Used  Substance Use Topics  . Alcohol use: Never    Frequency: Never  . Drug use: Never     Family Hx: The patient's family history includes CVA in his father; Heart attack in his brother and sister.  ROS:   Please see the history of present illness.     All other systems reviewed and are negative.   Prior CV studies:   The following studies were reviewed today:  Echo 02/25/2018  EF 60-65, Gr 2 DD, pericardial tissue AVR normally functioning with mean 11  Carotid US 10/02/17 Bilat ICA 1-39  Cardiac Catheterization 10/02/2017 LAD ost stent patent with 30 ISR, mid 44, 70 LCx ost 80, prox 100; OM2 80; Lat OM2 90 RCA mid 26 , stent patent with 60 ISR; RPDA 99 PCI:  POBA to LCx        Labs/Other Tests and Data Reviewed:    EKG:  An ECG dated 01/12/2018 was personally reviewed today and demonstrated:  sinus bradycardia, HR 47, normal axis, QTc 444, NSSTTW changes  Recent Labs: 10/02/2017: ALT 40 10/08/2017: BUN 19; Creatinine, Ser 1.13; Hemoglobin 12.2; Magnesium 2.3; Platelets 293; Potassium 3.6; Sodium 138   Recent Lipid Panel No results found for: CHOL, TRIG, HDL, CHOLHDL, LDLCALC, LDLDIRECT         Wt Readings from Last 3 Encounters:  05/16/18 200 lb (90.7 kg)  01/16/18 189 lb (85.7 kg)  01/11/18 198 lb (89.8 kg)     Objective:    Vital Signs:  BP 129/76   Pulse 67   Ht 5\' 11"  (1.803 m)   Wt 200 lb (90.7 kg)   BMI 27.89 kg/m    VITAL SIGNS:  reviewed GEN:  no acute distress RESPIRATORY:  no labored breathing NEURO:  alert and oriented PSYCH:  he seems to be in good spirits  ASSESSMENT & PLAN:     Coronary artery disease involving native coronary artery of native heart without angina  pectoris Hx or prior stent to the LAD and RCA and subsequent lateral STEMI in 10/2017 treated with POBA to the LCx followed by emergent CABG+AVR.  He is doing well without angina.  He was concerned about bruising and was originally told to stop Plavix. But, he has continued to take it without any further bruising.  We discussed the benefits of taking dual antiplatelet for 1 year post MI.  He will remain on Plavix and ASA for now.  Continue statin, low dose beta-blocker.  Follow up in 6 mos.   Aortic valve replaced Recent echo with normally functioning valve.  I reviewed his echocardiogram with him today.  Continue SBE prophylaxis.  Continue ASA.  Bradycardia HR in the 40s in Jan.  ECG from then personally reviewed.  He is asymptomatic on low dose Metoprolol.  His HRs have been 50-60s since.  Continue current Rx.  Essential hypertension The patient's blood pressure is controlled on his current regimen.  Continue current therapy.    Mixed hyperlipidemia LDL optimal on most recent lab work.  Continue current Rx.    Type 2 diabetes mellitus with other circulatory complication, without long-term current use of insulin (HCC) Recent A1c 6.6.  Good control.  Empagliflozin could be considered in the future given CV benefits.  Educated About Covid-19 Virus Infection The signs and symptoms of COVID-19 were discussed with the patient and how to seek care for testing (follow up with PCP or arrange E-visit).  The importance of social distancing was discussed today.  Time:   Today, I have spent 23 minutes with the patient with telehealth technology discussing the above problems.     Medication Adjustments/Labs and Tests Ordered: Current medicines are reviewed at length with the patient today.  Concerns regarding medicines are outlined above.   Tests Ordered: No orders of the defined types were placed in this encounter.   Medication Changes: No orders of the defined types were placed in this  encounter.   Disposition:  Follow up in 6  month(s)  Signed, Richardson Dopp, PA-C  05/16/2018 11:21 AM    [facility]

## 2018-05-16 ENCOUNTER — Other Ambulatory Visit: Payer: Self-pay

## 2018-05-16 ENCOUNTER — Telehealth (INDEPENDENT_AMBULATORY_CARE_PROVIDER_SITE_OTHER): Payer: Medicare HMO | Admitting: Physician Assistant

## 2018-05-16 VITALS — BP 129/76 | HR 67 | Ht 71.0 in | Wt 200.0 lb

## 2018-05-16 DIAGNOSIS — I2510 Atherosclerotic heart disease of native coronary artery without angina pectoris: Secondary | ICD-10-CM | POA: Diagnosis not present

## 2018-05-16 DIAGNOSIS — E1159 Type 2 diabetes mellitus with other circulatory complications: Secondary | ICD-10-CM

## 2018-05-16 DIAGNOSIS — Z7189 Other specified counseling: Secondary | ICD-10-CM

## 2018-05-16 DIAGNOSIS — R001 Bradycardia, unspecified: Secondary | ICD-10-CM

## 2018-05-16 DIAGNOSIS — I10 Essential (primary) hypertension: Secondary | ICD-10-CM

## 2018-05-16 DIAGNOSIS — E782 Mixed hyperlipidemia: Secondary | ICD-10-CM

## 2018-05-16 DIAGNOSIS — Z952 Presence of prosthetic heart valve: Secondary | ICD-10-CM

## 2018-05-16 NOTE — Telephone Encounter (Signed)
Virtual Visit Pre-Appointment Phone Call  "(Name), I am calling you today to discuss your upcoming appointment. We are currently trying to limit exposure to the virus that causes COVID-19 by seeing patients at home rather than in the office."  1. "What is the BEST phone number to call the day of the visit?" - include this in appointment notes  2. "Do you have or have access to (through a family member/friend) a smartphone with video capability that we can use for your visit?" a. If yes - list this number in appt notes as "cell" (if different from BEST phone #) and list the appointment type as a VIDEO visit in appointment notes b. If no - list the appointment type as a PHONE visit in appointment notes  3. Confirm consent - "In the setting of the current Covid19 crisis, you are scheduled for a (phone or video) visit with your provider on (date) at (time).  Just as we do with many in-office visits, in order for you to participate in this visit, we must obtain consent.  If you'd like, I can send this to your mychart (if signed up) or email for you to review.  Otherwise, I can obtain your verbal consent now.  All virtual visits are billed to your insurance company just like a normal visit would be.  By agreeing to a virtual visit, we'd like you to understand that the technology does not allow for your provider to perform an examination, and thus Georgiades limit your provider's ability to fully assess your condition. If your provider identifies any concerns that need to be evaluated in person, we will make arrangements to do so.  Finally, though the technology is pretty good, we cannot assure that it will always work on either your or our end, and in the setting of a video visit, we Bahar have to convert it to a phone-only visit.  In either situation, we cannot ensure that we have a secure connection.  Are you willing to proceed?" STAFF: Did the patient verbally acknowledge consent to telehealth visit? Document  YES/NO here:YES  4. Advise patient to be prepared - "Two hours prior to your appointment, go ahead and check your blood pressure, pulse, oxygen saturation, and your weight (if you have the equipment to check those) and write them all down. When your visit starts, your provider will ask you for this information. If you have an Apple Watch or Kardia device, please plan to have heart rate information ready on the day of your appointment. Please have a pen and paper handy nearby the day of the visit as well."  5. Give patient instructions for MyChart download to smartphone OR Doximity/Doxy.me as below if video visit (depending on what platform provider is using)  6. Inform patient they will receive a phone call 15 minutes prior to their appointment time (Starrett be from unknown caller ID) so they should be prepared to answer    TELEPHONE CALL NOTE  Erik Keith has been deemed a candidate for a follow-up tele-health visit to limit community exposure during the Covid-19 pandemic. I spoke with the patient via phone to ensure availability of phone/video source, confirm preferred email & phone number, and discuss instructions and expectations.  I reminded Erik Keith to be prepared with any vital sign and/or heart rhythm information that could potentially be obtained via home monitoring, at the time of his visit. I reminded Erik Keith to expect a phone call prior to his  visit.  Jacinta Shoe, CMA 05/16/2018 10:00 AM   INSTRUCTIONS FOR DOWNLOADING THE MYCHART APP TO SMARTPHONE  - The patient must first make sure to have activated MyChart and know their login information - If Apple, go to [employer] and type in MyChart in the search bar and download the app. If Android, ask patient to go to [employer] and type in Whitney in the search bar and download the app. The app is free but as with any other app downloads, their phone Mariotti require them to verify saved payment information or  Apple/Android password.  - The patient will need to then log into the app with their MyChart username and password, and select [facility] as their healthcare provider to link the account. When it is time for your visit, go to the MyChart app, find appointments, and click Begin Video Visit. Be sure to Select Allow for your device to access the Microphone and Camera for your visit. You will then be connected, and your provider will be with you shortly.  **If they have any issues connecting, or need assistance please contact MyChart service desk [phone])**  **If using a computer, in order to ensure the best quality for their visit they will need to use either of the following Internet Browsers: [employer], or Google Chrome**  IF USING DOXIMITY or DOXY.ME - The patient will receive a link just prior to their visit by text.     FULL LENGTH CONSENT FOR TELE-HEALTH VISIT   I hereby voluntarily request, consent and authorize [facility] and its employed or contracted physicians, physician assistants, nurse practitioners or other licensed health care professionals (the Practitioner), to provide me with telemedicine health care services (the "Services") as deemed necessary by the treating Practitioner. I acknowledge and consent to receive the Services by the Practitioner via telemedicine. I understand that the telemedicine visit will involve communicating with the Practitioner through live audiovisual communication technology and the disclosure of certain medical information by electronic transmission. I acknowledge that I have been given the opportunity to request an in-person assessment or other available alternative prior to the telemedicine visit and am voluntarily participating in the telemedicine visit.  I understand that I have the right to withhold or withdraw my consent to the use of telemedicine in the course of my care at any time, without affecting my right to future care  or treatment, and that the Practitioner or I [place] terminate the telemedicine visit at any time. I understand that I have the right to inspect all information obtained and/or recorded in the course of the telemedicine visit and Maroney receive copies of available information for a reasonable fee.  I understand that some of the potential risks of receiving the Services via telemedicine include:  Marland Kitchen Delay or interruption in medical evaluation due to technological equipment failure or disruption; . Information transmitted Hirt not be sufficient (e.g. poor resolution of images) to allow for appropriate medical decision making by the Practitioner; and/or  . In rare instances, security protocols could fail, causing a breach of personal health information.  Furthermore, I acknowledge that it is my responsibility to provide information about my medical history, conditions and care that is complete and accurate to the best of my ability. I acknowledge that Practitioner's advice, recommendations, and/or decision Sur be based on factors not within their control, such as incomplete or inaccurate data provided by me or distortions of diagnostic images or specimens that Gaunce result from electronic transmissions. I  understand that the practice of medicine is not an exact science and that Practitioner makes no warranties or guarantees regarding treatment outcomes. I acknowledge that I will receive a copy of this consent concurrently upon execution via email to the email address I last provided but Salih also request a printed copy by calling the office of [place].    I understand that my insurance will be billed for this visit.   I have read or had this consent read to me. . I understand the contents of this consent, which adequately explains the benefits and risks of the Services being provided via telemedicine.  . I have been provided ample opportunity to ask questions regarding this consent and the Services and have had  my questions answered to my satisfaction. . I give my informed consent for the services to be provided through the use of telemedicine in my medical care  By participating in this telemedicine visit I agree to the above.

## 2018-05-16 NOTE — Patient Instructions (Signed)
Medication Instructions:  Continue current medications  If you need a refill on your cardiac medications before your next appointment, please call your pharmacy.   Lab work: None  If you have labs (blood work) drawn today and your tests are completely normal, you will receive your results only by: Marland Kitchen MyChart Message (if you have MyChart) OR . A paper copy in the mail If you have any lab test that is abnormal or we need to change your treatment, we will call you to review the results.  Testing/Procedures: None  Follow-Up: At [facility], you and your health needs are our priority.  As part of our continuing mission to provide you with exceptional heart care, we have created designated Provider Care Teams.  These Care Teams include your primary Cardiologist (physician) and Advanced Practice Providers (APPs -  Physician Assistants and Nurse Practitioners) who all work together to provide you with the care you need, when you need it. You will need a follow up appointment in:  6 months.  Please call our office 2 months in advance to schedule this appointment.  You Ayyad see Lauree Chandler, MD or Richardson Dopp, PA-C

## 2018-05-21 ENCOUNTER — Ambulatory Visit: Payer: Medicare HMO | Admitting: Physician Assistant

## 2018-07-01 DIAGNOSIS — D472 Monoclonal gammopathy: Secondary | ICD-10-CM

## 2018-07-15 DIAGNOSIS — D472 Monoclonal gammopathy: Secondary | ICD-10-CM | POA: Diagnosis not present

## 2018-07-24 ENCOUNTER — Other Ambulatory Visit: Payer: Self-pay | Admitting: Cardiovascular Disease

## 2018-10-01 ENCOUNTER — Other Ambulatory Visit: Payer: Self-pay

## 2018-10-01 ENCOUNTER — Encounter: Payer: Self-pay | Admitting: Physician Assistant

## 2018-10-01 ENCOUNTER — Telehealth (INDEPENDENT_AMBULATORY_CARE_PROVIDER_SITE_OTHER): Payer: Medicare HMO | Admitting: Physician Assistant

## 2018-10-01 VITALS — BP 148/72 | HR 51 | Ht 71.0 in | Wt 197.0 lb

## 2018-10-01 DIAGNOSIS — E1159 Type 2 diabetes mellitus with other circulatory complications: Secondary | ICD-10-CM

## 2018-10-01 DIAGNOSIS — I2510 Atherosclerotic heart disease of native coronary artery without angina pectoris: Secondary | ICD-10-CM | POA: Diagnosis not present

## 2018-10-01 DIAGNOSIS — I350 Nonrheumatic aortic (valve) stenosis: Secondary | ICD-10-CM

## 2018-10-01 DIAGNOSIS — I10 Essential (primary) hypertension: Secondary | ICD-10-CM

## 2018-10-01 DIAGNOSIS — Z953 Presence of xenogenic heart valve: Secondary | ICD-10-CM

## 2018-10-01 DIAGNOSIS — E782 Mixed hyperlipidemia: Secondary | ICD-10-CM

## 2018-10-01 NOTE — Patient Instructions (Signed)
Medication Instructions:  Your physician recommends that you continue on your current medications as directed. Please refer to the Current Medication list given to you today.  If you need a refill on your cardiac medications before your next appointment, please call your pharmacy.   Lab work: None   If you have labs (blood work) drawn today and your tests are completely normal, you will receive your results only by: Marland Kitchen MyChart Message (if you have MyChart) OR . A paper copy in the mail If you have any lab test that is abnormal or we need to change your treatment, we will call you to review the results.  Testing/Procedures: None   Follow-Up: At [facility], you and your health needs are our priority.  As part of our continuing mission to provide you with exceptional heart care, we have created designated Provider Care Teams.  These Care Teams include your primary Cardiologist (physician) and Advanced Practice Providers (APPs -  Physician Assistants and Nurse Practitioners) who all work together to provide you with the care you need, when you need it. You will need a follow up appointment in 6 months.  Please call our office 2 months in advance to schedule this appointment.  You Gawronski see Lauree Chandler, MD or one of the following Advanced Practice Providers on your designated Care Team:   . Richardson Dopp PA-C  Any Other Special Instructions Will Be Listed Below (If Applicable).  Check your blood pressure every day for 2 weeks and send your readings in for review

## 2018-10-01 NOTE — Progress Notes (Signed)
Virtual Visit via Telephone Note   This visit type was conducted due to national recommendations for restrictions regarding the COVID-19 Pandemic (e.g. social distancing) in an effort to limit this patient's exposure and mitigate transmission in our community.  Due to his co-morbid illnesses, this patient is at least at moderate risk for complications without adequate follow up.  This format is felt to be most appropriate for this patient at this time.  The patient did not have access to video technology/had technical difficulties with video requiring transitioning to audio format only (telephone).  All issues noted in this document were discussed and addressed.  No physical exam could be performed with this format.  Please refer to the patient's chart for his  consent to telehealth for [facility].   Date:  10/01/2018   ID:  Erik Keith, DOB 11/24/1940, MRN [id number]  Patient Location: Home Provider Location: Office  PCP:  Raina Mina., MD  Cardiologist:  Lauree Chandler, MD   Electrophysiologist:  None   Evaluation Performed:  Follow-Up Visit  Chief Complaint:  CAD/AS s/p CABG/AVR  History of Present Illness:    Erik Keith is a 78 y.o. 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  Today, he notes he is doing well.  He has a lot of back pain.  Otherwise, he has not had chest discomfort, significant shortness of breath or syncope.  He has occasional dizziness.  He has not had lower extremity swelling.  The patient does not have  symptoms concerning for COVID-19 infection (fever, chills, cough, or new shortness of breath).    Past Medical History:  Diagnosis Date  . Aortic valve stenosis, severe   . CAD S/P percutaneous coronary angioplasty    12-2006  STENT TO LAD;  07-2007 DEStenting TO RCA   . GERD (gastroesophageal reflux disease)   . Hypertension   . Incidental pulmonary nodule, less than or equal to 38mm 10/02/2017   Ground glass opacities RUL seen on CTA  . Iron deficiency anemia   . Mixed hyperlipidemia   . OA (osteoarthritis) of shoulder    LEFT SHOULDER AND AC JOINT  . 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 Type 2 diabetes mellitus ([place])    Past Surgical History:  Procedure Laterality Date  . AORTIC VALVE REPLACEMENT N/A 10/03/2017   Procedure: AORTIC VALVE REPLACEMENT (AVR);  Surgeon: Rexene Alberts, MD;  Location: [place];  Service: Open Heart Surgery;  Laterality: N/A;  . APPENDECTOMY    . BACK SURGERY  x3  . CORONARY ARTERY BYPASS GRAFT N/A 10/03/2017   Procedure: CORONARY ARTERY BYPASS GRAFTING (CABG)x three, USING LEFT INTERNAL MAMMARY ARTERY AND RIGHT GREATER SAPHENOUS VEIN HARVESTED ENDOSCOPICALLY;  Surgeon: Rexene Alberts, MD;  Location: [place];  Service: Open Heart Surgery;  Laterality: N/A;  .  CORONARY/GRAFT ACUTE MI REVASCULARIZATION N/A 10/02/2017   Procedure: Coronary/Graft Acute MI Revascularization;  Surgeon: Jettie Booze, MD;  Location: [place] [facility];  Service: Cardiovascular;  Laterality: N/A;  . LEFT HEART CATH AND CORONARY ANGIOGRAPHY N/A 10/02/2017   Procedure: LEFT HEART CATH AND CORONARY ANGIOGRAPHY;  Surgeon: Jettie Booze, MD;  Location: [place] [facility];  Service: Cardiovascular;  Laterality: N/A;  . RIGHT/LEFT HEART CATH AND CORONARY ANGIOGRAPHY N/A 09/12/2017    Procedure: RIGHT/LEFT HEART CATH AND CORONARY ANGIOGRAPHY;  Surgeon: Burnell Blanks, MD;  Location: [place] [facility];  Service: Cardiovascular;  Laterality: N/A;  . TONSILLECTOMY       Current Meds  Medication Sig  . acetaminophen (TYLENOL) 500 MG tablet Take 2 tablets (1,000 mg total) by mouth every 6 (six) hours as needed for mild pain or fever.  Marland Kitchen aspirin EC 81 MG tablet Take 1 tablet (81 mg total) by mouth daily.  Marland Kitchen atorvastatin (LIPITOR) 20 MG tablet Take 1 tablet (20 mg total) by mouth daily at 6 PM.  . b complex vitamins tablet Take 1 tablet by mouth 4 (four) times a week.   . clopidogrel (PLAVIX) 75 MG tablet Take 75 mg by mouth daily.  Marland Kitchen glipiZIDE (GLUCOTROL XL) 5 MG 24 hr tablet Take 5 mg by mouth 2 (two) times daily.  . Glucos-Chond-Hyal Ac-Ca Fructo (MOVE FREE JOINT HEALTH ADVANCE PO) Take 1 tablet by mouth 3 (three) times a week.   Marland Kitchen HYDROcodone-acetaminophen (NORCO) 10-325 MG tablet Take 1 tablet by mouth 2 (two) times daily.   . metFORMIN (GLUCOPHAGE) 500 MG tablet Take 500 mg by mouth 2 (two) times daily.   . metoprolol succinate (TOPROL-XL) 25 MG 24 hr tablet TAKE 1 TABLET BY MOUTH EVERY DAY  . nitroGLYCERIN (NITROSTAT) 0.4 MG SL tablet Place 1 tablet (0.4 mg total) under the tongue every 5 (five) minutes as needed for chest pain.  . pantoprazole (PROTONIX) 40 MG tablet Take 40 mg by mouth at bedtime.   . Polyethylene Glycol 3350 (MIRALAX PO) Take 17 g by mouth daily. WITH COFFEE  . zolpidem (AMBIEN) 10 MG tablet Take 10 mg by mouth at bedtime as needed for sleep.     Allergies:   Contrast media [iodinated diagnostic agents] and Penicillins   Social History   Tobacco Use  . Smoking status: Former Smoker    Years: 40.00    Quit date: 2002    Years since quitting: 18.7  . Smokeless tobacco: Never Used  Substance Use Topics  . Alcohol use: Never    Frequency: Never  . Drug use: Never     Family Hx: The patient's family history includes CVA in his  father; Heart attack in his brother and sister.  ROS:   Please see the history of present illness.    All other systems reviewed and are negative.   Prior CV studies:   The following studies were reviewed today:  Echo 02/25/2018 EF 60-65, Gr 2 DD, pericardial tissue AVR normally functioning with mean 11  Carotid US 10/02/17 Bilat ICA 1-39  Cardiac Catheterization 10/02/2017 LAD ost stent patent with 30 ISR, mid 60, 70 LCx ost 80, prox 100; OM2 80; Lat OM2 90 RCA mid 70 , stent patent with 60 ISR; RPDA 99 PCI:  POBA to LCx      Labs/Other Tests and Data Reviewed:    EKG:  No ECG reviewed.  Recent Labs: 10/02/2017: ALT 40 10/08/2017: BUN 19; Creatinine, Ser 1.13; Hemoglobin 12.2;  Magnesium 2.3; Platelets 293; Potassium 3.6; Sodium 138   Recent Lipid Panel No results found for: CHOL, TRIG, HDL, CHOLHDL, LDLCALC, LDLDIRECT      Wt Readings from Last 3 Encounters:  10/01/18 197 lb (89.4 kg)  05/16/18 200 lb (90.7 kg)  01/16/18 189 lb (85.7 kg)     Objective:    Vital Signs:  BP (!) 148/72   Pulse (!) 51   Ht 5\' 11"  (1.803 m)   Wt 197 lb (89.4 kg)   BMI 27.48 kg/m    VITAL SIGNS:  reviewed GEN:  no acute distress RESPIRATORY:  No labored breathing NEURO:  Alert and oriented PSYCH:  Normal mood  ASSESSMENT & PLAN:    1. Coronary artery disease involving native coronary artery of native heart without angina pectoris 2. Severe aortic stenosis 3. 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  He had a lateral ST elevation myocardial infarction in October 2019 treated balloon angioplasty to the LCx and emergent bypass plus bioprosthetic AVR.  He is doing well without anginal symptoms.  Given his prior history of PCI with multiple stents, it is probably best for [place] to continue him on dual antiplatelet therapy for now.  He is tolerating it well with minimal bruising.  Continue aspirin, Plavix,  atorvastatin, metoprolol.  4. Essential hypertension Blood pressure borderline elevated today.  I have asked him to monitor his blood pressure over the next couple of weeks and send me those readings for review.  If his blood pressure is above target, I will place him on an ACE inhibitor/ARB.  5. Mixed hyperlipidemia Most recent LDL 75.  Continue current dose of atorvastatin.  He notes a prior history of elevated LFTs on higher dose statin therapy.  If his LDL continues to creep up above 70, consider adding Zetia.  6. Type 2 diabetes mellitus with other circulatory complication, without long-term current use of insulin ([place]) Managed by primary care.  Most recent A1c 6.8.  Empagliflozin could be considered given the results of the EMPA-REG OUTCOME trial.   Time:   Today, I have spent 14 minutes with the patient with telehealth technology discussing the above problems.     Medication Adjustments/Labs and Tests Ordered: Current medicines are reviewed at length with the patient today.  Concerns regarding medicines are outlined above.   Tests Ordered: No orders of the defined types were placed in this encounter.   Medication Changes: No orders of the defined types were placed in this encounter.   Follow Up:  In Person in 6 month(s)  Signed, Richardson Dopp, PA-C  10/01/2018 5:20 PM    [facility]

## 2018-10-13 ENCOUNTER — Encounter: Payer: Self-pay | Admitting: Thoracic Surgery (Cardiothoracic Vascular Surgery)

## 2018-10-13 ENCOUNTER — Telehealth: Payer: Self-pay | Admitting: Physician Assistant

## 2018-10-13 ENCOUNTER — Other Ambulatory Visit: Payer: Self-pay

## 2018-10-13 ENCOUNTER — Ambulatory Visit: Payer: Medicare HMO | Admitting: Thoracic Surgery (Cardiothoracic Vascular Surgery)

## 2018-10-13 VITALS — BP 188/74 | HR 52 | Temp 97.7°F | Resp 16 | Ht 71.0 in | Wt 197.0 lb

## 2018-10-13 DIAGNOSIS — Z951 Presence of aortocoronary bypass graft: Secondary | ICD-10-CM

## 2018-10-13 DIAGNOSIS — Z953 Presence of xenogenic heart valve: Secondary | ICD-10-CM

## 2018-10-13 DIAGNOSIS — Z952 Presence of prosthetic heart valve: Secondary | ICD-10-CM | POA: Diagnosis not present

## 2018-10-13 NOTE — Telephone Encounter (Signed)
Reviewed recent office note from Dr. Roxy Manns. His BP was quite elevated in his office. Please call patient and see if he has collected BP readings over the past 1-2 weeks. Richardson Dopp, PA-C    10/13/2018 5:05 PM

## 2018-10-13 NOTE — Patient Instructions (Addendum)
Continue all previous medications without any changes at this time  Check your blood pressure on a regular basis and keep a log for your records.  Discussed with your cardiologist and/or primary care physician whether or not your blood pressure medications should be adjusted.  Endocarditis is a potentially serious infection of heart valves or inside lining of the heart.  It occurs more commonly in patients with diseased heart valves (such as patient's with aortic or mitral valve disease) and in patients who have undergone heart valve repair or replacement.  Certain surgical and dental procedures Mauzy put you at risk, such as dental cleaning, other dental procedures, or any surgery involving the respiratory, urinary, gastrointestinal tract, gallbladder or prostate gland.   To minimize your chances for develooping endocarditis, maintain good oral health and seek prompt medical attention for any infections involving the mouth, teeth, gums, skin or urinary tract.    Always notify your doctor or dentist about your underlying heart valve condition before having any invasive procedures. You will need to take antibiotics before certain procedures, including all routine dental cleanings or other dental procedures.  Your cardiologist or dentist should prescribe these antibiotics for you to be taken ahead of time.      

## 2018-10-13 NOTE — Progress Notes (Signed)
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    He was last seen here in our office on January 16, 2018 at which time he was doing very well.  Since then he has been followed carefully at [facility].  He recently had a virtual follow-up visit with Richardson Dopp.  Decision was made to keep him on dual antiplatelet therapy because of his history of previous stents in the distant past.  His blood pressure has been running a little bit higher than anticipated and the patient plans to follow-up with Scott within the next 2 weeks to discuss adjusting his hypertensive medications.  Clinically the patient is doing remarkably well.  Overall he states that he feels "much improved" in comparison with how he felt prior to his surgery.  He is very active for his age and he is limited only by problems with his back.  Despite this he exerts himself quite frequently and strenuously.  He specifically states that he has no exertional chest pain or shortness of breath whatsoever.  He is delighted with his progress and states that he feels better than he has felt in a long time.   Current Outpatient Medications  Medication Sig Dispense Refill  . acetaminophen  (TYLENOL) 500 MG tablet Take 2 tablets (1,000 mg total) by mouth every 6 (six) hours as needed for mild pain or fever. 30 tablet 0  . aspirin EC 81 MG tablet Take 1 tablet (81 mg total) by mouth daily. 90 tablet 3  . atorvastatin (LIPITOR) 20 MG tablet Take 1 tablet (20 mg total) by mouth daily at 6 PM. 90 tablet 3  . clopidogrel (PLAVIX) 75 MG tablet Take 75 mg by mouth daily.    . Cyanocobalamin (B-12) 1000 MCG TABS Take 1 tablet by mouth daily.    Marland Kitchen glipiZIDE (GLUCOTROL XL) 5 MG 24 hr tablet Take 5 mg by mouth 2 (two) times daily.    Marland Kitchen HYDROcodone-acetaminophen (NORCO) 10-325 MG tablet Take 1 tablet by mouth 2 (two) times daily.     . metFORMIN (GLUCOPHAGE) 500 MG tablet Take 500 mg by mouth 2 (two) times daily.     . metoprolol succinate (TOPROL-XL) 25 MG 24 hr tablet TAKE 1 TABLET BY MOUTH EVERY DAY (Patient taking differently: 12.5 mg daily. ) 90 tablet 2  . nitroGLYCERIN (NITROSTAT) 0.4 MG SL tablet Place 1 tablet (0.4 mg total) under the tongue every 5 (five) minutes as needed for chest pain. 25 tablet 3  . pantoprazole (PROTONIX) 40 MG tablet Take 40 mg by mouth at bedtime.     . Polyethylene Glycol 3350 (MIRALAX PO) Take 17 g by mouth daily. WITH COFFEE    . zolpidem (AMBIEN) 10 MG tablet  Take 10 mg by mouth at bedtime as needed for sleep.     No current facility-administered medications for this visit.       Physical Exam:   BP (!) 188/74 (BP Location: Right Arm, Patient Position: Sitting, Cuff Size: Normal)   Pulse (!) 52   Temp 97.7 F (36.5 C)   Resp 16   Ht 5\' 11"  (1.803 m)   Wt 197 lb (89.4 kg)   SpO2 95% Comment: RA  BMI 27.48 kg/m   General:  Well-appearing  Chest:   Clear to auscultation  CV:   Regular rate and rhythm without murmur  Incisions:  Completely healed, sternum is stable  Abdomen:  Soft nontender  Extremities:  Warm and well-perfused  Diagnostic Tests:    ECHOCARDIOGRAM REPORT       Patient Name:   Erik Keith Date of Exam: 02/25/2018  Medical Rec #:  [id number]        Height:       71.0 in Accession #:    [id number]       Weight:       189.0 lb Date of Birth:  24-Aug-1940        BSA:          2.06 m Patient Age:    3 years         BP:           142/68 mmHg Patient Gender: M                HR:           57 bpm. Exam Location:  [facility]    Procedure: 2D Echo, Cardiac Doppler and Color Doppler  Indications:    Z95.2 Aortic Valve Replacement   History:        Patient has prior history of Echocardiogram examinations, most                 recent 09/05/2017. Aortic Valve Replacement and Prior CABG; Risk                 Factors: Hypertension, Diabetes and Dyslipidemia. Myocardial                 Infarction. Coronary artery disease.   Sonographer:    Wilford Sports Rodgers-Jones RDCS Referring Phys: [place]    1. The left ventricle has normal systolic function with an ejection fraction of 60-65%. The cavity size was normal. There is mildly increased left ventricular wall thickness. Left ventricular diastolic Doppler parameters are consistent with  pseudonormalization Elevated mean left atrial pressure.  2. The right ventricle has normal systolic function. The cavity was normal. There is no increase in right ventricular wall thickness.  3. The mitral valve is normal in structure.  4. The tricuspid valve is normal in structure.  5. 23 mm Edwards Inspiris Resilia bovine pericardial stented valve present and functioning properly. Peak velocity 2.36 m/s. Mean gradient 11 mmHg.  6. The pulmonic valve was normal in structure.  FINDINGS  Left Ventricle: The left ventricle has normal systolic function, with an ejection fraction of 60-65%. The cavity size was normal. There is mildly increased left ventricular wall thickness. Left ventricular diastolic Doppler parameters are consistent  with pseudonormalization Elevated mean left atrial pressure Right Ventricle: The right ventricle has normal  systolic function. The cavity was normal. There is no increase in right ventricular wall thickness. Left Atrium: left atrial size was normal in size Right  Atrium: right atrial size was normal in size. Right atrial pressure is estimated at 3 mmHg. Interatrial Septum: No atrial level shunt detected by color flow Doppler. Pericardium: There is no evidence of pericardial effusion. Mitral Valve: The mitral valve is normal in structure. Mitral valve regurgitation is trivial by color flow Doppler. Tricuspid Valve: The tricuspid valve is normal in structure. Tricuspid valve regurgitation is mild by color flow Doppler. Aortic Valve: The aortic valve has been repaired/replaced Aortic valve regurgitation was not visualized by color flow Doppler. 23 mm Edwards Inspiris Resilia bovine pericardial stented valve present and functioning properly. Peak velocity 2.36 m/s. Mean  gradient 11 mmHg. Pulmonic Valve: The pulmonic valve was normal in structure. Pulmonic valve regurgitation is not visualized by color flow Doppler. Venous: The inferior vena cava is normal in size with greater than 50% respiratory variability.   LEFT VENTRICLE PLAX 2D (Teich) LV EF:          61.2 %   Diastology LVIDd:          4.30 cm  LV e' lateral:   9.25 cm/s LVIDs:          2.90 cm  LV E/e' lateral: 12.9 LV PW:          1.30 cm  LV e' medial:    5.22 cm/s LV IVS:         1.10 cm  LV E/e' medial:  22.8 LVOT diam:      1.90 cm LV SV:          51 ml LVOT Area:      2.84 cm  RIGHT VENTRICLE RV Basal diam:  3.60 cm RV S prime:     8.16 cm/s TAPSE (M-mode): 1.6 cm RVSP:           26.5 mmHg  LEFT ATRIUM             Index       RIGHT ATRIUM           Index LA diam:        5.60 cm 2.72 cm/m  RA Pressure: 3 mmHg LA Vol (A2C):   52.0 ml 25.26 ml/m RA Area:     14.40 cm LA Vol (A4C):   46.0 ml 22.35 ml/m RA Volume:   38.90 ml  18.90 ml/m LA Biplane Vol: 49.3 ml 23.95 ml/m  AORTIC VALVE AV Area (Vmax):    1.78 cm AV Area  (Vmean):   1.66 cm AV Area (VTI):     1.58 cm AV Vmax:           236.80 cm/s AV Vmean:          159.400 cm/s AV VTI:            0.517 m AV Peak Grad:      22.4 mmHg AV Mean Grad:      11.6 mmHg LVOT Vmax:         149.00 cm/s LVOT Vmean:        93.300 cm/s LVOT VTI:          0.288 m LVOT/AV VTI ratio: 0.56   AORTA Ao Root diam: 3.60 cm Ao Asc diam:  3.40 cm  MITRAL VALVE               TRICUSPID VALVE MV Area (PHT):             TR Peak grad:   23.5 mmHg MV PHT:  TR Vmax:        274.00 cm/s MV Decel Time: 345 msec    RVSP:           26.5 mmHg MV E velocity: 119.00 cm/s MV A velocity: 89.70 cm/s MV E/A ratio:  1.33    Skeet Latch MD Electronically signed by Skeet Latch MD Signature Date/Time: 02/25/2018/7:18:30 PM   Impression:  Patient is doing very well approximately 1 year status post emergent coronary artery bypass grafting and aortic valve replacement using a bioprosthetic tissue valve.  His blood pressure is running a little bit higher than we hoped for but this is being addressed by Richardson Dopp who plans to adjust his hypertensive medications within the next week or 2.  Routine follow-up echocardiogram performed last February revealed normal left ventricular function with normal functioning bioprosthetic tissue valve in aortic position.    Plan:  We have not recommended any change the patient's current medications.  The patient will continue to monitor his blood pressure on a daily basis and discuss with Richardson Dopp so that his hypertensive medications can be adjusted.  The patient has been reminded regarding the importance of dental hygiene and the lifelong need for antibiotic prophylaxis for all dental cleanings and other related invasive procedures.  The patient will return to our office in the future only should specific problems or questions arise.   I spent in excess of 15 minutes during the conduct of this office consultation and  >50% of this time involved direct face-to-face encounter with the patient for counseling and/or coordination of their care.   Valentina Gu. Roxy Manns, MD 10/13/2018 12:12 PM

## 2018-10-14 NOTE — Telephone Encounter (Signed)
Pt calling back stated gave his list of bp readings to Dr. Roxy Manns and never received them back.  Pt will document bp readings till next Wednesday and call office with update.  Will send to [place] to [place].

## 2018-10-14 NOTE — Telephone Encounter (Signed)
lvm with Scotts recommendations.

## 2018-10-22 ENCOUNTER — Telehealth: Payer: Self-pay | Admitting: Physician Assistant

## 2018-10-22 DIAGNOSIS — I2510 Atherosclerotic heart disease of native coronary artery without angina pectoris: Secondary | ICD-10-CM

## 2018-10-22 DIAGNOSIS — I10 Essential (primary) hypertension: Secondary | ICD-10-CM

## 2018-10-22 DIAGNOSIS — Z79899 Other long term (current) drug therapy: Secondary | ICD-10-CM

## 2018-10-22 NOTE — Telephone Encounter (Signed)
New message   B/p readings:   152/80 hr 54 152/73 hr 42 138/75 hr 46 141/78 hr 46 146/69 hr 47 136/65 hr 52 147/66 hr 46 151/77 hr 49

## 2018-10-23 MED ORDER — LISINOPRIL 10 MG PO TABS: 10.0000 mg | ORAL_TABLET | Freq: Every day | ORAL | 0 refills | Status: DC

## 2018-10-23 NOTE — Telephone Encounter (Signed)
Spoke with patient about his BP readings. Per [employer] PA-C, BP is still above target goal of 130/80. Prescription sent in for lisinopril 10 mg PO daily. Patient will continue to monitor BP and call us back if BP still above target goal of 130/80 after being on lisinopril for a couple weeks. Patient also scheduled to come in for labs BMET on 11/06/18. Patient verbalized understanding.

## 2018-10-23 NOTE — Telephone Encounter (Signed)
BP is above target. PLAN:  1. Start Lisinopril 10 mg once daily  2. BMET in 2 weeks 3. Goal is < 130/80.  Call if BP readings remains (on average) above 130/80 after being on Lisinopril for a few weeks.  Richardson Dopp, PA-C    10/23/2018 9:06 AM

## 2018-10-29 ENCOUNTER — Telehealth: Payer: Self-pay | Admitting: Physician Assistant

## 2018-10-29 ENCOUNTER — Telehealth: Payer: Self-pay | Admitting: Cardiovascular Disease

## 2018-10-29 NOTE — Telephone Encounter (Signed)
° °  [facility] Pre-operative Risk Assessment    Request for surgical clearance:  1. What type of surgery is being performed? steroid injection  2. When is this surgery scheduled? 11-10-18  3. What type of clearance is required (medical clearance vs. Pharmacy clearance to hold med vs. Both)? none  4. Are there any medications that need to be held prior to surgery and how long? 5. none  6. Practice name and name of physician performing surgery? [facility]   7. What is your office phone number: [phone] option 4   7.   What is your office fax number:   8.   Anesthesia type (None, local, MAC, general) ? No  No anesthesia. The hospital will just put a numbing agent on the injection site.  Lattie Haw from [facility] called. There was some confusion in regards to this upcoming procedure. The patient will be having a joint injection, so the clinic does not need cardiac clearance or the patient to hold any medications for this procedure. If the patient was having a nerve block or going under general anesthesia, they would need cardiac clearance. The patient has had this procedure done multiple times before.

## 2018-10-29 NOTE — Telephone Encounter (Signed)
  Patient will be getting an sacroiliac injection on 11/10/18 and he wants to know if he will need to take an antibiotic for that. Patient states a detailed message can be left on his voicemail.

## 2018-10-29 NOTE — Telephone Encounter (Signed)
Encounter not needed

## 2018-10-29 NOTE — Telephone Encounter (Signed)
I s/w Erik Keith at [facility] who states they do not cardiac clearance or pharm clearance. NO clearance is needed by our office. No medications will be held for his injection. I will let our Pre op team know there is no clearance to be needed. I thanked Erik Keith for her help.

## 2018-10-29 NOTE — Telephone Encounter (Signed)
I have s/w the pt and explained to him that we are going to need a formal cardiac clearance form faxed to our office [phone]. Pt states he is having injection with [facility] in [place] # [phone]. I explained to the pt once we have clearance request we will let the office know. Pt thanked me for the call. Pt was asking does he need an antibiotic? I stated our clearance team will evaluate once we have clearance form.   I then called and left message for the pain clinic to please fax over cardiac clearance form with needed info such as type of procedure, anesthesia, meds to be held (plavix, asa?), name of surgeon performing procedure.

## 2018-11-06 ENCOUNTER — Other Ambulatory Visit: Payer: Medicare HMO | Admitting: *Deleted

## 2018-11-06 ENCOUNTER — Other Ambulatory Visit: Payer: Self-pay

## 2018-11-06 DIAGNOSIS — Z79899 Other long term (current) drug therapy: Secondary | ICD-10-CM

## 2018-11-06 DIAGNOSIS — I2510 Atherosclerotic heart disease of native coronary artery without angina pectoris: Secondary | ICD-10-CM

## 2018-11-06 DIAGNOSIS — I10 Essential (primary) hypertension: Secondary | ICD-10-CM

## 2018-11-07 ENCOUNTER — Telehealth: Payer: Self-pay

## 2018-11-07 LAB — BASIC METABOLIC PANEL
BUN/Creatinine Ratio: 17 (ref 10–24)
BUN: 17 mg/dL (ref 8–27)
CO2: 25 mmol/L (ref 20–29)
Calcium: 9.5 mg/dL (ref 8.6–10.2)
Chloride: 104 mmol/L (ref 96–106)
Creatinine, Ser: 1.03 mg/dL (ref 0.76–1.27)
GFR calc Af Amer: 79 mL/min/{1.73_m2} (ref >59)
GFR calc non Af Amer: 69 mL/min/{1.73_m2} (ref >59)
Glucose: 158 mg/dL — ABNORMAL HIGH (ref 65–99)
Potassium: 4.2 mmol/L (ref 3.5–5.2)
Sodium: 143 mmol/L (ref 134–144)

## 2018-11-07 NOTE — Telephone Encounter (Signed)
Notes recorded by Frederik Schmidt, RN on 11/07/2018 at 10:09 AM EST  The patient has been notified of the result and verbalized understanding. All questions (if any) were answered.  Frederik Schmidt, RN 11/07/2018 10:09 AM

## 2018-11-07 NOTE — Telephone Encounter (Signed)
-----   Message from Liliane Shi, [place] sent at 11/07/2018 10:06 AM EST ----- Creatinine, potassium normal. PLAN:   -Continue current medications and follow up as planned.  Richardson Dopp, PA-C    11/07/2018 10:05 AM

## 2018-12-12 ENCOUNTER — Telehealth: Payer: Self-pay

## 2018-12-12 NOTE — Telephone Encounter (Signed)
Pt's wife called to ask if pt would need an antibiotic before taking the COVID-19 vaccine d/t hx of AVR (2019). Informed her that prophylactic antibiotics are only necessary before dental or invasive procedures. Advised to discuss risks/benefits regarding vaccination w/ pt's PCP.

## 2018-12-24 ENCOUNTER — Other Ambulatory Visit: Payer: Self-pay | Admitting: Physician Assistant

## 2018-12-24 NOTE — Telephone Encounter (Signed)
Patient recently had virtual visit with Richardson Dopp, PA on 10/01/18 and was to continue current dose of atorvastatin 20 mg QD. Last LDL-75 in Care Everywhere on 06/18/18. Refill sent in under [place], [place].

## 2019-01-12 ENCOUNTER — Other Ambulatory Visit: Payer: Self-pay | Admitting: Physician Assistant

## 2019-01-12 DIAGNOSIS — I10 Essential (primary) hypertension: Secondary | ICD-10-CM

## 2019-01-12 DIAGNOSIS — I2510 Atherosclerotic heart disease of native coronary artery without angina pectoris: Secondary | ICD-10-CM

## 2019-01-12 DIAGNOSIS — Z79899 Other long term (current) drug therapy: Secondary | ICD-10-CM

## 2019-01-17 ENCOUNTER — Other Ambulatory Visit: Payer: Self-pay | Admitting: Physician Assistant

## 2019-01-19 ENCOUNTER — Other Ambulatory Visit: Payer: Self-pay | Admitting: Physician Assistant

## 2019-01-19 DIAGNOSIS — D472 Monoclonal gammopathy: Secondary | ICD-10-CM

## 2019-03-06 ENCOUNTER — Other Ambulatory Visit: Payer: Self-pay | Admitting: Physician Assistant

## 2019-03-12 ENCOUNTER — Telehealth: Payer: Self-pay | Admitting: Cardiovascular Disease

## 2019-03-12 NOTE — Telephone Encounter (Signed)
Patient reports for aboug 3-4 weeks  HR low but been in 40s-50s for a long long time.  Today he saw pain doctor and diastolic was [id number] at their office.  All other readings below were taken at home.  Reviewed meds.  He is taking 12.5 Toprol XL and lisinopril 10 mg.  Gets lightheaded stanging at sink washing dishes.  Has lots of pain daily (on scale on 1-10 he is always between 4-9)  Discussed how pain contributes to bp.  No falls or syncope.  He will bring bp cuff with him.  Scheduled with APP on 3/22 as he would prefer in office over virtual appointment.

## 2019-03-12 NOTE — Telephone Encounter (Signed)
New Message  Pt c/o BP issue: STAT if pt c/o blurred vision, one-sided weakness or slurred speech  1. What are your last 5 BP readings? 148/60; 44; 157/69; 46; 166/71; 41; 168/76; 45; 176/78; 51;  2. Are you having any other symptoms (ex. Dizziness, headache, blurred vision, passed out)? Dizziness, light-headedness filling like going to pass out  3. What is your BP issue? Elevated blood pressure and low pulse rate

## 2019-03-18 NOTE — Progress Notes (Signed)
CARDIOLOGY OFFICE NOTE  Date:  03/23/2019    Erik Keith Date of Birth: February 01, 1940 Medical Record [id number]  PCP:  Raina Mina., MD  Cardiologist:  Pedro Earls  Chief Complaint  Patient presents with  . Dizziness    Work in visit - seen for Dr. Angelena Form    History of Present Illness: Erik Keith is a 79 y.o. male who presents today for a work in visit. Seen for Dr. Angelena Form and Richardson Dopp PA.   He has a known history of CAD with remote stent to LAD in 2008, stent to RCA in 2009, STEMI 10/2017 with POBA to LCX and then CABG + AVR. He had post op AF and required amiodarone. No beta blocker due to bradycardia. Other issues include HTN, HLD, prior elevated LFTs on high dose statin in the past, iron deficiency anemia and DM.   Last seen by telehealth visit with Nicki Reaper back in September - he was felt to be doing ok. Lots of back pain noted. Cardiac status felt to be ok. BP was a little borderline and he was to monitor.   Phone call last week - "Patient reports for aboug 3-4 weeks  HR low but been in 40s-50s for a long long time.  Today he saw pain doctor and diastolic was [id number] at their office.  All other readings below were taken at home.  Reviewed meds.  He is taking 12.5 Toprol XL and lisinopril 10 mg.  Gets lightheaded stanging at sink washing dishes.  Has lots of pain daily (on scale on 1-10 he is always between 4-9)  Discussed how pain contributes to bp.  No falls or syncope.  He will bring bp cuff with him.  Scheduled with APP on 3/22 as he would prefer in office over virtual appointment."   Thus added to my schedule for today.   The patient does not have symptoms concerning for COVID-19 infection (fever, chills, cough, or new shortness of breath).   Comes in today. Here alone. He has lots of issues. He notes that he has basically not felt well for the past 3 months. He has started having DOE/chest pressure with exertion. Not able to do as much as he could  before. He is dizzy at times. Almost presyncopal at times - did not think to check his BP/HR during these times. He brings in his list of BP/HR - BP is not at goal. Typically bradycardic. No frank syncope noted. He also has concurrent back pain - followed in pain management. On chronic narcotic.   Past Medical History:  Diagnosis Date  . Aortic valve stenosis, severe   . CAD S/P percutaneous coronary angioplasty    12-2006  STENT TO LAD;  07-2007 DEStenting TO RCA   . GERD (gastroesophageal reflux disease)   . Hypertension   . Incidental pulmonary nodule, less than or equal to 68mm 10/02/2017   Ground glass opacities RUL seen on CTA  . Iron deficiency anemia   . Mixed hyperlipidemia   . OA (osteoarthritis) of shoulder    LEFT SHOULDER AND AC JOINT  . 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 Type 2 diabetes mellitus ([place])     Past Surgical History:  Procedure Laterality Date  . AORTIC VALVE  REPLACEMENT N/A 10/03/2017   Procedure: AORTIC VALVE REPLACEMENT (AVR);  Surgeon: Rexene Alberts, MD;  Location: [facility];  Service: Open Heart Surgery;  Laterality: N/A;  . APPENDECTOMY    . BACK SURGERY  x3  . CORONARY ARTERY BYPASS GRAFT N/A 10/03/2017   Procedure: CORONARY ARTERY BYPASS GRAFTING (CABG)x three, USING LEFT INTERNAL MAMMARY ARTERY AND RIGHT GREATER SAPHENOUS VEIN HARVESTED ENDOSCOPICALLY;  Surgeon: Rexene Alberts, MD;  Location: [facility];  Service: Open Heart Surgery;  Laterality: N/A;  . CORONARY/GRAFT ACUTE MI REVASCULARIZATION N/A 10/02/2017   Procedure: Coronary/Graft Acute MI Revascularization;  Surgeon: Jettie Booze, MD;  Location: [facility];  Service: Cardiovascular;  Laterality: N/A;  . LEFT HEART CATH AND CORONARY ANGIOGRAPHY N/A 10/02/2017   Procedure: LEFT HEART CATH AND  CORONARY ANGIOGRAPHY;  Surgeon: Jettie Booze, MD;  Location: [facility];  Service: Cardiovascular;  Laterality: N/A;  . RIGHT/LEFT HEART CATH AND CORONARY ANGIOGRAPHY N/A 09/12/2017   Procedure: RIGHT/LEFT HEART CATH AND CORONARY ANGIOGRAPHY;  Surgeon: Burnell Blanks, MD;  Location: [facility];  Service: Cardiovascular;  Laterality: N/A;  . TONSILLECTOMY       Medications: Current Meds  Medication Sig  . acetaminophen (TYLENOL) 500 MG tablet Take 2 tablets (1,000 mg total) by mouth every 6 (six) hours as needed for mild pain or fever.  Marland Kitchen aspirin EC 81 MG tablet Take 1 tablet (81 mg total) by mouth daily.  Marland Kitchen atorvastatin (LIPITOR) 20 MG tablet TAKE 1 TABLET (20 MG TOTAL) BY MOUTH DAILY AT 6 PM.  . clopidogrel (PLAVIX) 75 MG tablet TAKE 1 TABLET BY MOUTH EVERY DAY  . Cyanocobalamin (B-12) 1000 MCG TABS Take 1 tablet by mouth daily.  . ferrous sulfate 325 (65 FE) MG EC tablet Take by mouth.  Marland Kitchen glipiZIDE (GLUCOTROL XL) 5 MG 24 hr tablet Take 5 mg by mouth 2 (two) times daily.  Marland Kitchen glucose blood (ONETOUCH ULTRA) test strip USE TO CHECK BLOOD SUGAR ONCE A DAY E11.9  . HYDROcodone-acetaminophen (NORCO) 10-325 MG tablet Take 1 tablet by mouth 2 (two) times daily.   . metFORMIN (GLUCOPHAGE) 500 MG tablet Take 500 mg by mouth 2 (two) times daily.   . nitroGLYCERIN (NITROSTAT) 0.4 MG SL tablet Place 1 tablet (0.4 mg total) under the tongue every 5 (five) minutes as needed for chest pain.  . pantoprazole (PROTONIX) 40 MG tablet Take 40 mg by mouth at bedtime.   . Polyethylene Glycol 3350 (MIRALAX PO) Take 17 g by mouth daily. WITH COFFEE  . Wheat Dextrin (BENEFIBER) POWD Benefiber, Mix 1 tablespoon with a liquid and drink once daily.  Marland Kitchen zolpidem (AMBIEN) 10 MG tablet Take 10 mg by mouth at bedtime as needed for sleep.  . [DISCONTINUED] lisinopril (ZESTRIL) 10 MG tablet TAKE 1 TABLET BY MOUTH EVERY DAY  . [DISCONTINUED] metoprolol tartrate (LOPRESSOR) 25 MG tablet TAKE 0.5  TABLETS (12.5 MG TOTAL) BY MOUTH 2 (TWO) TIMES DAILY.     Allergies: Allergies  Allergen Reactions  . Contrast Media [Iodinated Diagnostic Agents] Hives  . Penicillins Hives    Has patient had a PCN reaction causing immediate rash, facial/tongue/throat swelling, SOB or lightheadedness with hypotension: unkn Has patient had a PCN reaction causing severe rash involving mucus membranes or skin necrosis: unkn Has patient had a PCN reaction that required hospitalization: unkn Has patient had a PCN reaction occurring within the last 10 years: no If all of the above answers are "NO", then [place] proceed with  Cephalosporin use.     Social History: The patient  reports that he quit smoking about 19 years ago. He quit after 40.00 years of use. He has never used smokeless tobacco. He reports that he does not drink alcohol or use drugs.   Family History: The patient's family history includes CVA in his father; Heart attack in his brother and sister.   Review of Systems: Please see the history of present illness.   All other systems are reviewed and negative.   Physical Exam: VS:  BP (!) 144/84   Pulse (!) 53   Ht 5\' 11"  (1.803 m)   Wt 206 lb 6.4 oz (93.6 kg)   SpO2 95%   BMI 28.79 kg/m  .  BMI Body mass index is 28.79 kg/m.  Wt Readings from Last 3 Encounters:  03/23/19 206 lb 6.4 oz (93.6 kg)  10/13/18 197 lb (89.4 kg)  10/01/18 197 lb (89.4 kg)   BP by me is 200/80. His machine (wrist) is 180/82 General: Pleasant. He got a little anxious during the visit. Alert and in no acute distress.   HEENT: Normal.  Neck: Supple, no JVD, carotid bruits, or masses noted.  Cardiac: Regular rate and rhythm. Soft outflow murmur. No edema.  Respiratory:  Lungs are clear to auscultation bilaterally with normal work of breathing.  GI: Soft and nontender.  MS: No deformity or atrophy. Gait and ROM intact.  Skin: Warm and dry. Color is normal.  Neuro:  Strength and sensation are intact and no gross  focal deficits noted.  Psych: Alert, appropriate and with normal affect.   LABORATORY DATA:  EKG:  EKG is ordered today. This demonstrates sinus bradycardia - HR is 53 - inferior T wave changes - unchanged tracing.  Lab Results  Component Value Date   WBC 13.8 (H) 10/08/2017   HGB 12.2 (L) 10/08/2017   HCT 38.5 (L) 10/08/2017   PLT 293 10/08/2017   GLUCOSE 158 (H) 11/06/2018   ALT 40 10/02/2017   AST 44 (H) 10/02/2017   NA 143 11/06/2018   K 4.2 11/06/2018   CL 104 11/06/2018   CREATININE 1.03 11/06/2018   BUN 17 11/06/2018   CO2 25 11/06/2018   INR 1.59 10/03/2017   HGBA1C 8.5 (H) 10/02/2017     BNP (last 3 results) No results for input(s): BNP in the last 8760 hours.  ProBNP (last 3 results) No results for input(s): PROBNP in the last 8760 hours.   Other Studies Reviewed Today: Echo 02/25/2018 EF 60-65, Gr 2 DD, pericardial tissue AVR normally functioning with mean 11  10/2017 Procedure:        Aortic Valve Replacement             [place] Inspiris Resilia Stented Bovine Pericardial Tissue Valve (size 41mm, ref # [id number], serial # [id number])   Coronary Artery Bypass Grafting x 4             Left Internal Mammary Artery to Distal Left Anterior Descending Coronary Artery             Saphenous Vein Graft to Posterior Descending Coronary Artery             Saphenous Vein Graft to First Obtuse Marginal Branch of Left Circumflex Coronary Artery             Sequential Saphenous Vein Graft to Second Obtuse Marginal Branch of Left Circumflex Coronary             Endoscopic Vein Harvest  from Right Thigh and Lower Leg   Carotid US 10/02/17 Bilat ICA 1-39  Cardiac Catheterization10/02/2017 LAD ost stent patent with 30 ISR, mid 60, 70 LCx ost 80, prox 100; OM2 80; Lat OM2 90 RCA mid 70 , stent patent with 60 ISR; RPDA 99 PCI: POBA to LCx     ASSESSMENT & PLAN:    1. Chest pressure/DOE - worrisome - will get his echo updated. Adding Imdur 30 mg a day. [place]  need to consider cardiac cath if not able to get his symptoms to improve.   2. HTN - not controlled - increasing his ACE today. Lab today.  Needs to not use the wrist cuff as well.   3. Bradycardia - with reports of presyncope - stopping beta blocker today - if persists - [place] need monitor arranged on return.   4. CAD with remote PCIs and subsequent emergent CABG with AVR - adding Imdur today.   5. Prior AVR - echo today.   6. HLD - on statin.   7. DM - per PCP  8. COVID-19 Education: The signs and symptoms of COVID-19 were discussed with the patient and how to seek care for testing (follow up with PCP or arrange E-visit).  The importance of social distancing, staying at home, hand hygiene and wearing a mask when out in public were discussed today.  Current medicines are reviewed with the patient today.  The patient does not have concerns regarding medicines other than what has been noted above.  The following changes have been made:  See above.  Labs/ tests ordered today include:    Orders Placed This Encounter  Procedures  . Basic metabolic panel  . CBC  . Hepatic function panel  . Lipid panel  . EKG 12-Lead  . ECHOCARDIOGRAM COMPLETE     Disposition:   FU with me next week.    Patient is agreeable to this plan and will call if any problems develop in the interim.   SignedTruitt Merle, NP  03/23/2019 11:18 AM  [facility] [address] [place], [place]  [address] Phone: [phone] Fax: [phone]

## 2019-03-23 ENCOUNTER — Encounter: Payer: Self-pay | Admitting: Nurse Practitioner

## 2019-03-23 ENCOUNTER — Ambulatory Visit: Payer: Medicare HMO | Admitting: Nurse Practitioner

## 2019-03-23 ENCOUNTER — Ambulatory Visit (HOSPITAL_COMMUNITY): Payer: Medicare HMO | Attending: Cardiovascular Disease

## 2019-03-23 ENCOUNTER — Encounter (INDEPENDENT_AMBULATORY_CARE_PROVIDER_SITE_OTHER): Payer: Self-pay

## 2019-03-23 ENCOUNTER — Other Ambulatory Visit: Payer: Self-pay

## 2019-03-23 VITALS — BP 144/84 | HR 53 | Ht 71.0 in | Wt 206.4 lb

## 2019-03-23 DIAGNOSIS — R001 Bradycardia, unspecified: Secondary | ICD-10-CM | POA: Diagnosis not present

## 2019-03-23 DIAGNOSIS — I2510 Atherosclerotic heart disease of native coronary artery without angina pectoris: Secondary | ICD-10-CM

## 2019-03-23 DIAGNOSIS — I10 Essential (primary) hypertension: Secondary | ICD-10-CM | POA: Diagnosis present

## 2019-03-23 DIAGNOSIS — Z79899 Other long term (current) drug therapy: Secondary | ICD-10-CM | POA: Diagnosis present

## 2019-03-23 DIAGNOSIS — E782 Mixed hyperlipidemia: Secondary | ICD-10-CM

## 2019-03-23 DIAGNOSIS — Z953 Presence of xenogenic heart valve: Secondary | ICD-10-CM | POA: Diagnosis present

## 2019-03-23 LAB — ECHOCARDIOGRAM COMPLETE
Height: 71 in
Weight: 3302.4 oz

## 2019-03-23 MED ORDER — LISINOPRIL 20 MG PO TABS: 20.0000 mg | ORAL_TABLET | Freq: Every day | ORAL | 3 refills | Status: DC

## 2019-03-23 MED ORDER — PERFLUTREN LIPID MICROSPHERE
1.0000 mL | INTRAVENOUS | Status: AC | PRN
  Administered 2019-03-23: 1 mL via INTRAVENOUS

## 2019-03-23 MED ORDER — ISOSORBIDE MONONITRATE ER 30 MG PO TB24: 30.0000 mg | ORAL_TABLET | Freq: Every day | ORAL | 3 refills | Status: DC

## 2019-03-23 NOTE — Patient Instructions (Addendum)
After Visit Summary:  We will be checking the following labs today - BMET, CBC, HPF and Lipids   Medication Instructions:    Continue with your current medicines. BUT  I am stopping Metoprolol  I am increasing the Lisinopril to 20 mg a day - this is at your pharmacy - you can take 2 of your 10 mg tablets and use those up  I am adding Imdur 30 mg a day - this [place] cause a headache - you can continue to use your pain medicine/Tylenol and try taking this at night as well.    If you need a refill on your cardiac medications before your next appointment, please call your pharmacy.     Testing/Procedures To Be Arranged:  Echocardiogram later today  Follow-Up:   See me next week     At [facility], you and your health needs are our priority.  As part of our continuing mission to provide you with exceptional heart care, we have created designated Provider Care Teams.  These Care Teams include your primary Cardiologist (physician) and Advanced Practice Providers (APPs -  Physician Assistants and Nurse Practitioners) who all work together to provide you with the care you need, when you need it.  Special Instructions:  . Stay safe, stay home, wash your hands for at least 20 seconds and wear a mask when out in public.  . It was good to talk with you today.    Call the [facility] office at [phone] if you have any questions, problems or concerns.

## 2019-03-24 ENCOUNTER — Telehealth: Payer: Self-pay | Admitting: Nurse Practitioner

## 2019-03-24 LAB — CBC
Hematocrit: 43.6 % (ref 37.5–51.0)
Hemoglobin: 14.6 g/dL (ref 13.0–17.7)
MCH: 29.8 pg (ref 26.6–33.0)
MCHC: 33.5 g/dL (ref 31.5–35.7)
MCV: 89 fL (ref 79–97)
Platelets: 173 10*3/uL (ref 150–450)
RBC: 4.9 x10E6/uL (ref 4.14–5.80)
RDW: 15.7 % — ABNORMAL HIGH (ref 11.6–15.4)
WBC: 7.8 10*3/uL (ref 3.4–10.8)

## 2019-03-24 LAB — HEPATIC FUNCTION PANEL
ALT: 17 IU/L (ref 0–44)
AST: 15 IU/L (ref 0–40)
Albumin: 4.7 g/dL (ref 3.7–4.7)
Alkaline Phosphatase: 81 IU/L (ref 39–117)
Bilirubin Total: 0.5 mg/dL (ref 0.0–1.2)
Bilirubin, Direct: 0.14 mg/dL (ref 0.00–0.40)
Total Protein: 7.6 g/dL (ref 6.0–8.5)

## 2019-03-24 LAB — LIPID PANEL
Chol/HDL Ratio: 3.6 ratio (ref 0.0–5.0)
Cholesterol, Total: 115 mg/dL (ref 100–199)
HDL: 32 mg/dL — ABNORMAL LOW (ref >39)
LDL Chol Calc (NIH): 48 mg/dL (ref 0–99)
Triglycerides: 215 mg/dL — ABNORMAL HIGH (ref 0–149)
VLDL Cholesterol Cal: 35 mg/dL (ref 5–40)

## 2019-03-24 LAB — BASIC METABOLIC PANEL
BUN/Creatinine Ratio: 17 (ref 10–24)
BUN: 20 mg/dL (ref 8–27)
CO2: 24 mmol/L (ref 20–29)
Calcium: 9.8 mg/dL (ref 8.6–10.2)
Chloride: 106 mmol/L (ref 96–106)
Creatinine, Ser: 1.16 mg/dL (ref 0.76–1.27)
GFR calc Af Amer: 69 mL/min/{1.73_m2} (ref >59)
GFR calc non Af Amer: 60 mL/min/{1.73_m2} (ref >59)
Glucose: 107 mg/dL — ABNORMAL HIGH (ref 65–99)
Potassium: 4.1 mmol/L (ref 3.5–5.2)
Sodium: 146 mmol/L — ABNORMAL HIGH (ref 134–144)

## 2019-03-24 NOTE — Telephone Encounter (Signed)
° °  Pt returning call regarding lab results  Pleas call

## 2019-03-25 NOTE — Progress Notes (Signed)
CARDIOLOGY OFFICE NOTE  Date:  03/30/2019    Baldo Ash Millette Date of Birth: August 04, 1940 Medical Record [id number]  PCP:  Raina Mina., MD  Cardiologist:  [facility]  Chief Complaint  Patient presents with  . Follow-up    Seen for Dr. Pedro Earls    History of Present Illness: Kardin Kravec Ikard is a 79 y.o. male who presents today for a follow up visit. Seen for Dr. Angelena Form and Richardson Dopp PA.   He has a known history of CAD with remote stent to LAD in 2008, stent to RCA in 2009, STEMI 10/2017 with POBA to LCX and then CABG + AVR. He had post op AF and required amiodarone. No beta blocker due to bradycardia. Other issues include HTN, HLD, prior elevated LFTs on high dose statin in the past, iron deficiency anemia and DM.   Last seen by telehealth visit with Nicki Reaper back in September - he was felt to be doing ok. Lots of back pain noted. Cardiac status felt to be ok. BP was a little borderline and he was to monitor.   He called earlier this month with low HR and high BP. Was dizzy as well. I then saw him for a work in - concerning symptoms for chest pain also noted for the past 3 months. Echo was updated and was stable. Labs checked. ACE was increased and beta blocker was stopped. Imdur was added as well.    The patient does not have symptoms concerning for COVID-19 infection (fever, chills, cough, or new shortness of breath).   Comes in today. Here alone.  He says he feels better. His dizziness is resolved. Has had some "pounding" with coming in here today. He mowed last week - took 2 hours - did fine - he used the weed eater and did fine and one day put out grass seed - he was fine with all these activities. Still with some chest pressure noted - hard for him to describe - comes and goes - sounds exertional. But did not have with all the yard work that he did this past weekend except for putting out the seed perhaps. He is not sure this was shortness of  breath instead - hard for him to describe. He did have headache with the Imdur and feels like this has helped him feel better. Tells me that today, walking in here to the office probably bothered him the most - otherwise, he thinks he has done ok. BP readings from home are primarily high. He is eating a bag of pork skins every day.   Past Medical History:  Diagnosis Date  . Aortic valve stenosis, severe   . CAD S/P percutaneous coronary angioplasty    12-2006  STENT TO LAD;  07-2007 DEStenting TO RCA   . GERD (gastroesophageal reflux disease)   . Hypertension   . Incidental pulmonary nodule, less than or equal to 73mm 10/02/2017   Ground glass opacities RUL seen on CTA  . Iron deficiency anemia   . Mixed hyperlipidemia   . OA (osteoarthritis) of shoulder    LEFT SHOULDER AND AC JOINT  . 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 Type 2 diabetes mellitus ([place])     Past Surgical History:  Procedure Laterality  Date  . AORTIC VALVE REPLACEMENT N/A 10/03/2017   Procedure: AORTIC VALVE REPLACEMENT (AVR);  Surgeon: Rexene Alberts, MD;  Location: [facility];  Service: Open Heart Surgery;  Laterality: N/A;  . APPENDECTOMY    . BACK SURGERY  x3  . CORONARY ARTERY BYPASS GRAFT N/A 10/03/2017   Procedure: CORONARY ARTERY BYPASS GRAFTING (CABG)x three, USING LEFT INTERNAL MAMMARY ARTERY AND RIGHT GREATER SAPHENOUS VEIN HARVESTED ENDOSCOPICALLY;  Surgeon: Rexene Alberts, MD;  Location: [facility];  Service: Open Heart Surgery;  Laterality: N/A;  . CORONARY/GRAFT ACUTE MI REVASCULARIZATION N/A 10/02/2017   Procedure: Coronary/Graft Acute MI Revascularization;  Surgeon: Jettie Booze, MD;  Location: [facility];  Service: Cardiovascular;  Laterality: N/A;  . LEFT HEART CATH AND CORONARY ANGIOGRAPHY N/A  10/02/2017   Procedure: LEFT HEART CATH AND CORONARY ANGIOGRAPHY;  Surgeon: Jettie Booze, MD;  Location: [facility];  Service: Cardiovascular;  Laterality: N/A;  . RIGHT/LEFT HEART CATH AND CORONARY ANGIOGRAPHY N/A 09/12/2017   Procedure: RIGHT/LEFT HEART CATH AND CORONARY ANGIOGRAPHY;  Surgeon: Burnell Blanks, MD;  Location: [facility];  Service: Cardiovascular;  Laterality: N/A;  . TONSILLECTOMY       Medications: Current Meds  Medication Sig  . acetaminophen (TYLENOL) 500 MG tablet Take 2 tablets (1,000 mg total) by mouth every 6 (six) hours as needed for mild pain or fever.  Marland Kitchen aspirin EC 81 MG tablet Take 1 tablet (81 mg total) by mouth daily.  Marland Kitchen atorvastatin (LIPITOR) 20 MG tablet TAKE 1 TABLET (20 MG TOTAL) BY MOUTH DAILY AT 6 PM.  . clopidogrel (PLAVIX) 75 MG tablet TAKE 1 TABLET BY MOUTH EVERY DAY  . Cyanocobalamin (B-12) 1000 MCG TABS Take 1 tablet by mouth daily.  . ferrous sulfate 325 (65 FE) MG EC tablet Take by mouth.  Marland Kitchen glipiZIDE (GLUCOTROL XL) 5 MG 24 hr tablet Take 5 mg by mouth 2 (two) times daily.  Marland Kitchen glucose blood (ONETOUCH ULTRA) test strip USE TO CHECK BLOOD SUGAR ONCE A DAY E11.9  . HYDROcodone-acetaminophen (NORCO) 10-325 MG tablet Take 1 tablet by mouth 2 (two) times daily.   . isosorbide mononitrate (IMDUR) 30 MG 24 hr tablet Take 1 tablet (30 mg total) by mouth daily.  Marland Kitchen lisinopril (ZESTRIL) 20 MG tablet Take 1 tablet (20 mg total) by mouth daily.  . metFORMIN (GLUCOPHAGE) 500 MG tablet Take 500 mg by mouth 2 (two) times daily.   . nitroGLYCERIN (NITROSTAT) 0.4 MG SL tablet Place 1 tablet (0.4 mg total) under the tongue every 5 (five) minutes as needed for chest pain.  . pantoprazole (PROTONIX) 40 MG tablet Take 40 mg by mouth at bedtime.   . Polyethylene Glycol 3350 (MIRALAX PO) Take 17 g by mouth daily. WITH COFFEE  . Wheat Dextrin (BENEFIBER) POWD Benefiber, Mix 1 tablespoon with a liquid and drink once daily.  Marland Kitchen zolpidem (AMBIEN)  10 MG tablet Take 10 mg by mouth at bedtime as needed for sleep.     Allergies: Allergies  Allergen Reactions  . Contrast Media [Iodinated Diagnostic Agents] Hives  . Penicillins Hives    Has patient had a PCN reaction causing immediate rash, facial/tongue/throat swelling, SOB or lightheadedness with hypotension: unkn Has patient had a PCN reaction causing severe rash involving mucus membranes or skin necrosis: unkn Has patient had a PCN reaction that required hospitalization: unkn Has patient had a PCN reaction occurring within the last 10 years: no If all of the above answers are "NO",  then Bina proceed with Cephalosporin use.     Social History: The patient  reports that he quit smoking about 19 years ago. He quit after 40.00 years of use. He has never used smokeless tobacco. He reports that he does not drink alcohol or use drugs.   Family History: The patient's family history includes CVA in his father; Heart attack in his brother and sister.   Review of Systems: Please see the history of present illness.   All other systems are reviewed and negative.   Physical Exam: VS:  BP (!) 170/84   Pulse (!) 58   Ht 5\' 11"  (1.803 m)   Wt 208 lb 12.8 oz (94.7 kg)   SpO2 96%   BMI 29.12 kg/m  .  BMI Body mass index is 29.12 kg/m.  Wt Readings from Last 3 Encounters:  03/30/19 208 lb 12.8 oz (94.7 kg)  03/23/19 206 lb 6.4 oz (93.6 kg)  10/13/18 197 lb (89.4 kg)   BP by me is 210/80. His arm cuff was 2014/106 and wrist cuff was 209/100.   General: Alert and in no acute distress. Very talkative.   Cardiac: Regular rate and rhythm. No murmurs, rubs, or gallops. No edema.  Respiratory:  Lungs are clear to auscultation bilaterally with normal work of breathing.  GI: Soft and nontender.  MS: No deformity or atrophy. Gait and ROM intact.  Skin: Warm and dry. Color is normal.  Neuro:  Strength and sensation are intact and no gross focal deficits noted.  Psych: Alert, appropriate and  with normal affect.   LABORATORY DATA:  EKG:  EKG is not ordered today.    Lab Results  Component Value Date   WBC 7.8 03/23/2019   HGB 14.6 03/23/2019   HCT 43.6 03/23/2019   PLT 173 03/23/2019   GLUCOSE 107 (H) 03/23/2019   CHOL 115 03/23/2019   TRIG 215 (H) 03/23/2019   HDL 32 (L) 03/23/2019   LDLCALC 48 03/23/2019   ALT 17 03/23/2019   AST 15 03/23/2019   NA 146 (H) 03/23/2019   K 4.1 03/23/2019   CL 106 03/23/2019   CREATININE 1.16 03/23/2019   BUN 20 03/23/2019   CO2 24 03/23/2019   INR 1.59 10/03/2017   HGBA1C 8.5 (H) 10/02/2017     BNP (last 3 results) No results for input(s): BNP in the last 8760 hours.  ProBNP (last 3 results) No results for input(s): PROBNP in the last 8760 hours.   Other Studies Reviewed Today:  ECHO IMPRESSIONS 03/2019  1. Left ventricular ejection fraction, by estimation, is 60 to 65%. The  left ventricle has normal function. The left ventricle has no regional  wall motion abnormalities. There is mild concentric left ventricular  hypertrophy. Left ventricular diastolic  parameters are indeterminate.  2. Right ventricular systolic function is normal. The right ventricular  size is normal.  3. The mitral valve is normal in structure. Mild mitral valve  regurgitation. No evidence of mitral stenosis.  4. The aortic valve gradient is likely normal for this prosthetic AV and  is unchanged from his previous echo from Feb. 2020.   Marland Kitchen The aortic valve has been repaired/replaced. Aortic valve  regurgitation is not visualized. There is a 23 mm Edwards Inspiris Resilia  bovine valve present in the aortic position. Procedure Date: 10/03/17.   Comparison(s): 02/25/18 EF 60-65%. AV 35mmHg mean, 79mmHg peak.    10/2017 Procedure:   Aortic Valve Replacement Edwards Inspiris Resilia Stented BovinePericardial Tissue Valve (size 70mm,ref#  [id number], serial [id number])   Coronary Artery Bypass Grafting  x4 Left Internal Mammary Artery to Distal Left Anterior Descending Coronary Artery Saphenous Vein Graft to Posterior Descending Coronary Artery Saphenous Vein Graft to FirstObtuse Marginal Branch of Left Circumflex Coronary Artery Sequential Saphenous Vein Graft toSecondObtuse Marginal Branch of Left Circumflex Coronary Endoscopic Vein Harvest from RightThigh and Lower Leg   Carotid US 10/02/17 Bilat ICA 1-39  Cardiac Catheterization10/02/2017 LAD ost stent patent with 30 ISR, mid 60, 70 LCx ost 80, prox 100; OM2 80; Lat OM2 90 RCA mid 47 , stent patent with 60 ISR; RPDA 99 PCI: POBA to LCx     ASSESSMENT & PLAN:   1. HTN - not controlled - adding Norvasc 5 mg and HCTZ 12.5 mg a day.   2. Chest pressure - sounds like this has improved with the addition of Imdur - hard for him to elaborate - adding Norvasc today. Still [place] need cath but will continue to push medical therapy for now.   3.  Bradycardia - this has improved - he has had no more spells of feeling pre syncopal.  4. Known CAD - with remote PCIs and prior emergent CABG with AVR - have added Imdur - will continue this.   5. Prior AVR - his most recent echo is reassuring.   6. HLD - on statin  7. Uncontrolled DM - per PCP  8. COVID-19 Education: The signs and symptoms of COVID-19 were discussed with the patient and how to seek care for testing (follow up with PCP or arrange E-visit).  The importance of social distancing, staying at home, hand hygiene and wearing a mask when out in public were discussed today.  Current medicines are reviewed with the patient today.  The patient does not have concerns regarding medicines other than what has been noted above.  The following changes have been made:  See above.  Labs/ tests ordered today include:   No orders of the defined types were placed in this encounter.    Disposition:   FU with me in 1  week. Will check BMET on return.    Patient is agreeable to this plan and will call if any problems develop in the interim.   SignedTruitt Merle, NP  03/30/2019 12:11 PM  [facility] [address] [place], [place]  [address] Phone: [phone] Fax: [phone]

## 2019-03-30 ENCOUNTER — Encounter: Payer: Self-pay | Admitting: Nurse Practitioner

## 2019-03-30 ENCOUNTER — Other Ambulatory Visit: Payer: Self-pay

## 2019-03-30 ENCOUNTER — Ambulatory Visit: Payer: Medicare HMO | Admitting: Nurse Practitioner

## 2019-03-30 VITALS — BP 170/84 | HR 58 | Ht 71.0 in | Wt 208.8 lb

## 2019-03-30 DIAGNOSIS — I10 Essential (primary) hypertension: Secondary | ICD-10-CM

## 2019-03-30 DIAGNOSIS — Z953 Presence of xenogenic heart valve: Secondary | ICD-10-CM

## 2019-03-30 DIAGNOSIS — Z79899 Other long term (current) drug therapy: Secondary | ICD-10-CM | POA: Diagnosis not present

## 2019-03-30 DIAGNOSIS — E782 Mixed hyperlipidemia: Secondary | ICD-10-CM

## 2019-03-30 DIAGNOSIS — R001 Bradycardia, unspecified: Secondary | ICD-10-CM

## 2019-03-30 DIAGNOSIS — I2510 Atherosclerotic heart disease of native coronary artery without angina pectoris: Secondary | ICD-10-CM

## 2019-03-30 DIAGNOSIS — Z7189 Other specified counseling: Secondary | ICD-10-CM

## 2019-03-30 MED ORDER — AMLODIPINE BESYLATE 5 MG PO TABS: 5.0000 mg | ORAL_TABLET | Freq: Every day | ORAL | 3 refills | Status: DC

## 2019-03-30 MED ORDER — HYDROCHLOROTHIAZIDE 12.5 MG PO CAPS: 12.5000 mg | ORAL_CAPSULE | Freq: Every day | ORAL | 3 refills | Status: DC

## 2019-03-30 NOTE — Patient Instructions (Addendum)
After Visit Summary:  We will be checking the following labs today - NONE   Medication Instructions:    Continue with your current medicines. BUT  I am adding Norvasc 5 mg to take today as soon as you get - then daily at night  I am starting HCTZ 12.5 mg to take once a day - in the mornings   If you need a refill on your cardiac medications before your next appointment, please call your pharmacy.     Testing/Procedures To Be Arranged:  N/A  Follow-Up:   See me next week. We will check lab but it will not be fasting    At [facility], you and your health needs are our priority.  As part of our continuing mission to provide you with exceptional heart care, we have created designated Provider Care Teams.  These Care Teams include your primary Cardiologist (physician) and Advanced Practice Providers (APPs -  Physician Assistants and Nurse Practitioners) who all work together to provide you with the care you need, when you need it.  Special Instructions:  . Stay safe, stay home, wash your hands for at least 20 seconds and wear a mask when out in public.  . It was good to talk with you today.    Call the [place] office at [phone] if you have any questions, problems or concerns.

## 2019-04-01 NOTE — Progress Notes (Signed)
CARDIOLOGY OFFICE NOTE  Date:  04/08/2019    Erik Keith Date of Birth: Jun 08, 1940 Medical Record [id number]  PCP:  Raina Mina., MD  Cardiologist:  Aldona Bar   Chief Complaint  Patient presents with  . Follow-up    History of Present Illness: Erik Keith is a 79 y.o. male who presents today for a follow up visit. Seen for Dr. Angelena Form and Richardson Dopp PA.   He has a known history of CAD with remote stent to LAD in 2008, stent to RCA in 2009, STEMI 10/2017 with POBA to LCX and then CABG + AVR. He had post op AF and required amiodarone. No beta blocker due to bradycardia. Other issues include HTN, HLD, prior elevated LFTs on high dose statin in the past, iron deficiency anemia and DM.  Last seen by telehealth visit with Nicki Reaper back in September - he was felt to be doing ok. Lots of back pain noted. Cardiac status felt to be ok. BP was a little borderline and he was to monitor.   He called earlier last month with low HR and high BP. Was dizzy as well. I then saw him for a work in - concerning symptoms for chest pain also noted for the past 3 months. Echo was updated and was stable. Labs checked. ACE was increased and beta blocker was stopped. Imdur was added as well. He was better on return visit - less chest pain - hard for him to describe - BP was pretty high - eating lots of pork skins. Norvasc and HCTZ were added to his regimen.   The patient does not have symptoms concerning for COVID-19 infection (fever, chills, cough, or new shortness of breath).   Comes in today. Here alone. He says he is feeling ok. Worked out in the yard for about 3 hours yesterday. No more chest pain. He is not short of breath. No more lightheadedness. His back is now back to his primary issue - on chronic narcotic for this. No problems with his current regimen. Stopped his pork skins and his Cheez-its. BP has improved.   Past Medical History:  Diagnosis Date  . Aortic valve  stenosis, severe   . CAD S/P percutaneous coronary angioplasty    12-2006  STENT TO LAD;  07-2007 DEStenting TO RCA   . GERD (gastroesophageal reflux disease)   . Hypertension   . Incidental pulmonary nodule, less than or equal to 64mm 10/02/2017   Ground glass opacities RUL seen on CTA  . Iron deficiency anemia   . Mixed hyperlipidemia   . OA (osteoarthritis) of shoulder    LEFT SHOULDER AND AC JOINT  . 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 Type 2 diabetes mellitus ([place])     Past Surgical History:  Procedure Laterality Date  . AORTIC VALVE REPLACEMENT N/A 10/03/2017   Procedure: AORTIC VALVE REPLACEMENT (AVR);  Surgeon: Rexene Alberts, MD;  Location: [facility];  Service: Open Heart Surgery;  Laterality: N/A;  . APPENDECTOMY    . BACK SURGERY  x3  . CORONARY ARTERY BYPASS GRAFT N/A 10/03/2017   Procedure: CORONARY ARTERY BYPASS GRAFTING (CABG)x three, USING LEFT INTERNAL MAMMARY ARTERY AND RIGHT GREATER SAPHENOUS VEIN HARVESTED ENDOSCOPICALLY;  Surgeon: Rexene Alberts, MD;  Location: [facility];  Service: Open Heart Surgery;  Laterality: N/A;  . CORONARY/GRAFT ACUTE MI REVASCULARIZATION N/A 10/02/2017   Procedure: Coronary/Graft Acute MI Revascularization;  Surgeon: Jettie Booze, MD;  Location: [facility];  Service: Cardiovascular;  Laterality: N/A;  . LEFT HEART CATH AND CORONARY ANGIOGRAPHY N/A 10/02/2017   Procedure: LEFT HEART CATH AND CORONARY ANGIOGRAPHY;  Surgeon: Jettie Booze, MD;  Location: [facility];  Service: Cardiovascular;  Laterality: N/A;  . RIGHT/LEFT HEART CATH AND CORONARY ANGIOGRAPHY N/A 09/12/2017   Procedure: RIGHT/LEFT HEART CATH AND CORONARY ANGIOGRAPHY;  Surgeon: Burnell Blanks, MD;  Location: [facility];  Service: Cardiovascular;   Laterality: N/A;  . TONSILLECTOMY       Medications: Current Meds  Medication Sig  . acetaminophen (TYLENOL) 500 MG tablet Take 2 tablets (1,000 mg total) by mouth every 6 (six) hours as needed for mild pain or fever.  Marland Kitchen amLODipine (NORVASC) 5 MG tablet Take 1 tablet (5 mg total) by mouth at bedtime.  Marland Kitchen aspirin EC 81 MG tablet Take 1 tablet (81 mg total) by mouth daily.  Marland Kitchen atorvastatin (LIPITOR) 20 MG tablet TAKE 1 TABLET (20 MG TOTAL) BY MOUTH DAILY AT 6 PM.  . clopidogrel (PLAVIX) 75 MG tablet TAKE 1 TABLET BY MOUTH EVERY DAY  . Cyanocobalamin (B-12) 1000 MCG TABS Take 1 tablet by mouth daily.  . ferrous sulfate 325 (65 FE) MG EC tablet Take by mouth.  Marland Kitchen glipiZIDE (GLUCOTROL XL) 5 MG 24 hr tablet Take 5 mg by mouth 2 (two) times daily.  Marland Kitchen glucose blood (ONETOUCH ULTRA) test strip USE TO CHECK BLOOD SUGAR ONCE A DAY E11.9  . hydrochlorothiazide (MICROZIDE) 12.5 MG capsule Take 1 capsule (12.5 mg total) by mouth daily.  Marland Kitchen HYDROcodone-acetaminophen (NORCO) 10-325 MG tablet Take 1 tablet by mouth 2 (two) times daily.   . isosorbide mononitrate (IMDUR) 30 MG 24 hr tablet Take 1 tablet (30 mg total) by mouth daily.  Marland Kitchen lisinopril (ZESTRIL) 20 MG tablet Take 1 tablet (20 mg total) by mouth daily.  . metFORMIN (GLUCOPHAGE) 500 MG tablet Take 500 mg by mouth 2 (two) times daily.   . nitroGLYCERIN (NITROSTAT) 0.4 MG SL tablet Place 1 tablet (0.4 mg total) under the tongue every 5 (five) minutes as needed for chest pain.  . pantoprazole (PROTONIX) 40 MG tablet Take 40 mg by mouth at bedtime.   . Polyethylene Glycol 3350 (MIRALAX PO) Take 17 g by mouth daily. WITH COFFEE  . Wheat Dextrin (BENEFIBER) POWD Benefiber, Mix 1 tablespoon with a liquid and drink once daily.  Marland Kitchen zolpidem (AMBIEN) 10 MG tablet Take 10 mg by mouth at bedtime as needed for sleep.     Allergies: Allergies  Allergen Reactions  . Contrast Media [Iodinated Diagnostic Agents] Hives  . Penicillins Hives    Has patient had a  PCN reaction causing immediate rash, facial/tongue/throat swelling, SOB or lightheadedness with hypotension: unkn Has patient had a PCN reaction causing severe rash involving mucus membranes or skin necrosis: unkn Has patient had a PCN reaction that required hospitalization: unkn Has patient had a PCN reaction occurring within the last 10 years: no If all of the above answers are "NO", then Rhyne proceed with Cephalosporin use.     Social History: The patient  reports that he quit smoking about 19 years ago. He quit after 40.00 years of use. He has never used smokeless tobacco. He reports that he does not drink alcohol or use  drugs.   Family History: The patient's family history includes CVA in his father; Heart attack in his brother and sister.   Review of Systems: Please see the history of present illness.   All other systems are reviewed and negative.   Physical Exam: VS:  BP 140/78   Pulse 78   Ht 5\' 11"  (1.803 m)   Wt 200 lb 12.8 oz (91.1 kg)   SpO2 96%   BMI 28.01 kg/m  .  BMI Body mass index is 28.01 kg/m.  Wt Readings from Last 3 Encounters:  04/08/19 200 lb 12.8 oz (91.1 kg)  03/30/19 208 lb 12.8 oz (94.7 kg)  03/23/19 206 lb 6.4 oz (93.6 kg)   BP by me is 140/84  General: Alert and in no acute distress. His weight is down.  Cardiac: Regular rate and rhythm. No significant murmur.  Respiratory:  Lungs are clear to auscultation bilaterally with normal work of breathing.  GI: Soft and nontender.  MS: No deformity or atrophy. Gait and ROM intact.  Skin: Warm and dry. Color is normal.  Neuro:  Strength and sensation are intact and no gross focal deficits noted.  Psych: Alert, appropriate and with normal affect.   LABORATORY DATA:  EKG:  EKG is not ordered today.    Lab Results  Component Value Date   WBC 7.8 03/23/2019   HGB 14.6 03/23/2019   HCT 43.6 03/23/2019   PLT 173 03/23/2019   GLUCOSE 107 (H) 03/23/2019   CHOL 115 03/23/2019   TRIG 215 (H) 03/23/2019    HDL 32 (L) 03/23/2019   LDLCALC 48 03/23/2019   ALT 17 03/23/2019   AST 15 03/23/2019   NA 146 (H) 03/23/2019   K 4.1 03/23/2019   CL 106 03/23/2019   CREATININE 1.16 03/23/2019   BUN 20 03/23/2019   CO2 24 03/23/2019   INR 1.59 10/03/2017   HGBA1C 8.5 (H) 10/02/2017     BNP (last 3 results) No results for input(s): BNP in the last 8760 hours.  ProBNP (last 3 results) No results for input(s): PROBNP in the last 8760 hours.   Other Studies Reviewed Today:  ECHO IMPRESSIONS 03/2019  1. Left ventricular ejection fraction, by estimation, is 60 to 65%. The  left ventricle has normal function. The left ventricle has no regional  wall motion abnormalities. There is mild concentric left ventricular  hypertrophy. Left ventricular diastolic  parameters are indeterminate.  2. Right ventricular systolic function is normal. The right ventricular  size is normal.  3. The mitral valve is normal in structure. Mild mitral valve  regurgitation. No evidence of mitral stenosis.  4. The aortic valve gradient is likely normal for this prosthetic AV and  is unchanged from his previous echo from Feb. 2020.   Marland Kitchen The aortic valve has been repaired/replaced. Aortic valve  regurgitation is not visualized. There is a 23 mm Edwards Inspiris Resilia  bovine valve present in the aortic position. Procedure Date: 10/03/17.   Comparison(s): 02/25/18 EF 60-65%. AV 54mmHg mean, 29mmHg peak.    10/2019Procedure:   Aortic Valve Replacement Edwards Inspiris Resilia Stented BovinePericardial Tissue Valve (size 55mm,ref# [id number], serial [id number])   Coronary Artery Bypass Grafting x4 Left Internal Mammary Artery to Distal Left Anterior Descending Coronary Artery Saphenous Vein Graft to Posterior Descending Coronary Artery Saphenous Vein Graft to FirstObtuse Marginal Branch of Left Circumflex Coronary Artery Sequential  Saphenous Vein Graft toSecondObtuse Marginal Branch of Left Circumflex Coronary Endoscopic Vein Harvest from RightThigh and Lower Leg  Carotid US 10/02/17 Bilat ICA 1-39  Cardiac Catheterization10/02/2017 LAD ost stent patent with 30 ISR, mid 60, 70 LCx ost 80, prox 100; OM2 80; Lat OM2 90 RCA mid 70 , stent patent with 60 ISR; RPDA 99 PCI: POBA to LCx     ASSESSMENT & PLAN:   1. HTN - BP is improved and almost at goal with last week's medicine changes - for now, would stay on his current regimen.   2. Chest pressure - this has resolved with adding Imdur and Norvasc.   3. Bradycardia - this resolved with stopping beta blocker.   4. Known CAD - remote PCIs and prior CABG with AVR - managed medically.   5. Prior AVR - recent echo was reassuring.   6. HLD - on statin - not discussed.   7. Uncontrolled DM - per PCP - hopefully his diet is changing. His weight is down today.   8. COVID-19 Education: The signs and symptoms of COVID-19 were discussed with the patient and how to seek care for testing (follow up with PCP or arrange E-visit).  The importance of social distancing, staying at home, hand hygiene and wearing a mask when out in public were discussed today.  Current medicines are reviewed with the patient today.  The patient does not have concerns regarding medicines other than what has been noted above.  The following changes have been made:  See above.  Labs/ tests ordered today include:    Orders Placed This Encounter  Procedures  . Basic metabolic panel     Disposition:   FU with [place] in one month.    Patient is agreeable to this plan and will call if any problems develop in the interim.   SignedTruitt Merle, NP  04/08/2019 11:31 AM  [facility] [address] [place], [place]  [address] Phone: [phone] Fax: [phone]

## 2019-04-08 ENCOUNTER — Other Ambulatory Visit: Payer: Self-pay

## 2019-04-08 ENCOUNTER — Ambulatory Visit: Payer: Medicare HMO | Admitting: Nurse Practitioner

## 2019-04-08 ENCOUNTER — Encounter: Payer: Self-pay | Admitting: Nurse Practitioner

## 2019-04-08 VITALS — BP 140/78 | HR 78 | Ht 71.0 in | Wt 200.8 lb

## 2019-04-08 DIAGNOSIS — I10 Essential (primary) hypertension: Secondary | ICD-10-CM

## 2019-04-08 DIAGNOSIS — Z953 Presence of xenogenic heart valve: Secondary | ICD-10-CM | POA: Diagnosis not present

## 2019-04-08 DIAGNOSIS — E782 Mixed hyperlipidemia: Secondary | ICD-10-CM

## 2019-04-08 DIAGNOSIS — I2510 Atherosclerotic heart disease of native coronary artery without angina pectoris: Secondary | ICD-10-CM

## 2019-04-08 DIAGNOSIS — Z79899 Other long term (current) drug therapy: Secondary | ICD-10-CM

## 2019-04-08 NOTE — Patient Instructions (Addendum)
After Visit Summary:  We will be checking the following labs today - BMET    Medication Instructions:    Continue with your current medicines.    If you need a refill on your cardiac medications before your next appointment, please call your pharmacy.     Testing/Procedures To Be Arranged:  N/A  Follow-Up:   See Dr. Angelena Form in one month    At [facility], you and your health needs are our priority.  As part of our continuing mission to provide you with exceptional heart care, we have created designated Provider Care Teams.  These Care Teams include your primary Cardiologist (physician) and Advanced Practice Providers (APPs -  Physician Assistants and Nurse Practitioners) who all work together to provide you with the care you need, when you need it.  Special Instructions:  . Stay safe, stay home, wash your hands for at least 20 seconds and wear a mask when out in public.  . It was good to talk with you today.  Marland Kitchen Keep a check on your BP for [place] and keep a record   Call the [place] office at [phone] if you have any questions, problems or concerns.

## 2019-04-09 LAB — BASIC METABOLIC PANEL
BUN/Creatinine Ratio: 20 (ref 10–24)
BUN: 26 mg/dL (ref 8–27)
CO2: 23 mmol/L (ref 20–29)
Calcium: 10.3 mg/dL — ABNORMAL HIGH (ref 8.6–10.2)
Chloride: 103 mmol/L (ref 96–106)
Creatinine, Ser: 1.28 mg/dL — ABNORMAL HIGH (ref 0.76–1.27)
GFR calc Af Amer: 61 mL/min/{1.73_m2} (ref >59)
GFR calc non Af Amer: 53 mL/min/{1.73_m2} — ABNORMAL LOW (ref >59)
Glucose: 189 mg/dL — ABNORMAL HIGH (ref 65–99)
Potassium: 4.4 mmol/L (ref 3.5–5.2)
Sodium: 143 mmol/L (ref 134–144)

## 2019-04-20 IMAGING — CT CT CTA ABD/PEL W/CM AND/OR W/O CM
1 of 3 series · 1 of 27 positions shown · IV contrast (iopamidol)
Comparison: 11/27/2006 chest CT.

CLINICAL DATA: Severe symptomatic aortic stenosis. TAVR evaluation.

EXAM:
CT ANGIOGRAPHY CHEST, ABDOMEN AND PELVIS
TECHNIQUE: Multidetector CT imaging through the chest, abdomen and pelvis was
performed using the standard protocol during bolus administration of
intravenous contrast. Multiplanar reconstructed images and MIPs were
obtained and reviewed to evaluate the vascular anatomy.
CONTRAST:  100mL [id number] IOPAMIDOL ([id number]) INJECTION 76%

[Series 577: — · 0.47mm/px · 1 of 6 slices shown]
[im 4/6]
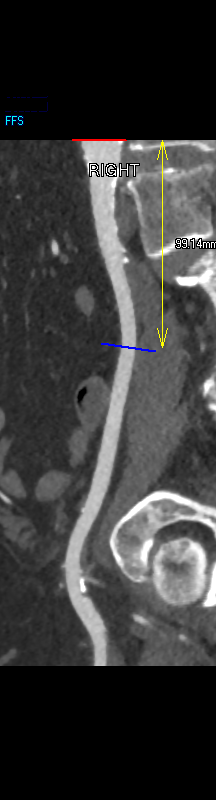

[1 of 27 positions shown; findings below may reference images not displayed]

FINDINGS: CTA CHEST FINDINGS

Cardiovascular: Top-normal heart size. No significant pericardial
effusion/thickening. Severe thickening and calcification of the
aortic valve. Three-vessel coronary atherosclerosis. Atherosclerotic
nonaneurysmal thoracic aorta. Normal caliber pulmonary arteries. No
central pulmonary emboli.

Mediastinum/Nodes: No discrete thyroid nodules. Unremarkable
esophagus. No pathologically enlarged axillary, mediastinal or hilar
lymph nodes. Simple 1.5 x 1.0 cm right anterior mediastinal cystic
structure (series 6/image 39), new since 11/27/2006 chest CT.

Lungs/Pleura: No pneumothorax. No pleural effusion. Mild-to-moderate
centrilobular emphysema with mild diffuse bronchial wall thickening.
There are ground-glass pulmonary nodules in the right upper lobe
measuring 1.8 cm (series 7/image 24) and 1.7 cm (series 7/image 40),
not definitely seen on the 8228 chest CT. No acute consolidative
airspace disease, lung masses or additional significant pulmonary
nodules.

Musculoskeletal: No aggressive appearing focal osseous lesions.
Moderate thoracic spondylosis.

CTA ABDOMEN AND PELVIS FINDINGS

Hepatobiliary: Normal liver size. Scattered tiny granulomatous liver
calcifications. No liver masses. Normal gallbladder with no
radiopaque cholelithiasis. No biliary ductal dilatation.

Pancreas: Normal, with no mass or duct dilation.

Spleen: Normal size. No mass.

Adrenals/Urinary Tract: Normal adrenals. Scattered subcentimeter
hypodense renal cortical lesions in both kidneys, too small to
characterize, which require no follow-up. No hydronephrosis. Normal
bladder.

Stomach/Bowel: Normal non-distended stomach. Normal caliber small
bowel with no small bowel wall thickening. Appendectomy. Mild
sigmoid diverticulosis. No large bowel wall thickening or
significant pericolonic fat stranding. Moderate colonic stool
volume.

Vascular/Lymphatic: Atherosclerotic nonaneurysmal abdominal aorta.
Patent portal, splenic and renal veins. No pathologically enlarged
lymph nodes in the abdomen or pelvis.

Reproductive: Mildly enlarged prostate.

Other: No pneumoperitoneum, ascites or focal fluid collection.

Musculoskeletal: No aggressive appearing focal osseous lesions.
Moderate lumbar spondylosis.

VASCULAR MEASUREMENTS PERTINENT TO TAVR:

AORTA:

Minimal Aortic 1iameter-6C.H x 13.9 mm (infrarenal abdominal aorta
on series 6/image 134)

Severity of Aortic Calcification-moderate

RIGHT PELVIS:

Right Common Iliac Artery -

Minimal 5iameter-7.J x 7.1 mm

Tortuosity-mild

Calcification-moderate

Right External Iliac Artery -

Minimal 7iameter-H.7 x 6.2 mm

Tortuosity-mild

Calcification-mild

Right Common Femoral Artery -

Minimal Piameter-9.G x 5.7 mm

Tortuosity-mild

Calcification-moderate

LEFT PELVIS:

Left Common Iliac Artery -

Minimal Wiameter-9.W x 7.4 mm

Tortuosity-mild

Calcification-moderate to severe

Left External Iliac Artery -

Minimal Kiameter-Q.P x 5.0 mm

Tortuosity-mild-to-moderate

Calcification-mild

Left Common Femoral Artery -

Minimal Fiameter-S.R x 5.5 mm

Tortuosity-mild

Calcification-moderate

Review of the MIP images confirms the above findings.
IMPRESSION: 1. Vascular findings and measurements pertinent to potential TAVR
procedure, as detailed above.
2. Severe thickening and calcification of the aortic valve,
compatible with the provided clinical history of severe symptomatic
aortic stenosis.
3. Two right upper lobe ground-glass pulmonary nodules, largest
cm. Non-contrast chest CT at 3-6 months is recommended. If nodules
persist, subsequent management will be based upon the most
suspicious nodule(s). This recommendation follows the consensus
statement: Guidelines for Management of Incidental Pulmonary Nodules
Detected on CT Images:From the [HOSPITAL] 5202; published
online before print (10.1148/radiol.6110101035).
4. Small simple 1.5 cm cystic structure in the right anterior
mediastinum. Differential includes pericardial cyst, benign thymic
cyst or cystic thymoma. This structure can also be reassessed on the
follow-up chest CT in 3-6 months.
5. Three-vessel coronary atherosclerosis.
6. Aortic Atherosclerosis (IE2VN-XRP.P) and Emphysema (IE2VN-BVY.R).
7. Mildly enlarged prostate.
8. Mild sigmoid diverticulosis.

## 2019-04-21 IMAGING — DX DG CHEST 1V PORT
1 series · 1 of 1 positions shown · non-contrast
Comparison: 10/02/2017

CLINICAL DATA: Postop

EXAM:
PORTABLE CHEST 1 VIEW

[chest ap]
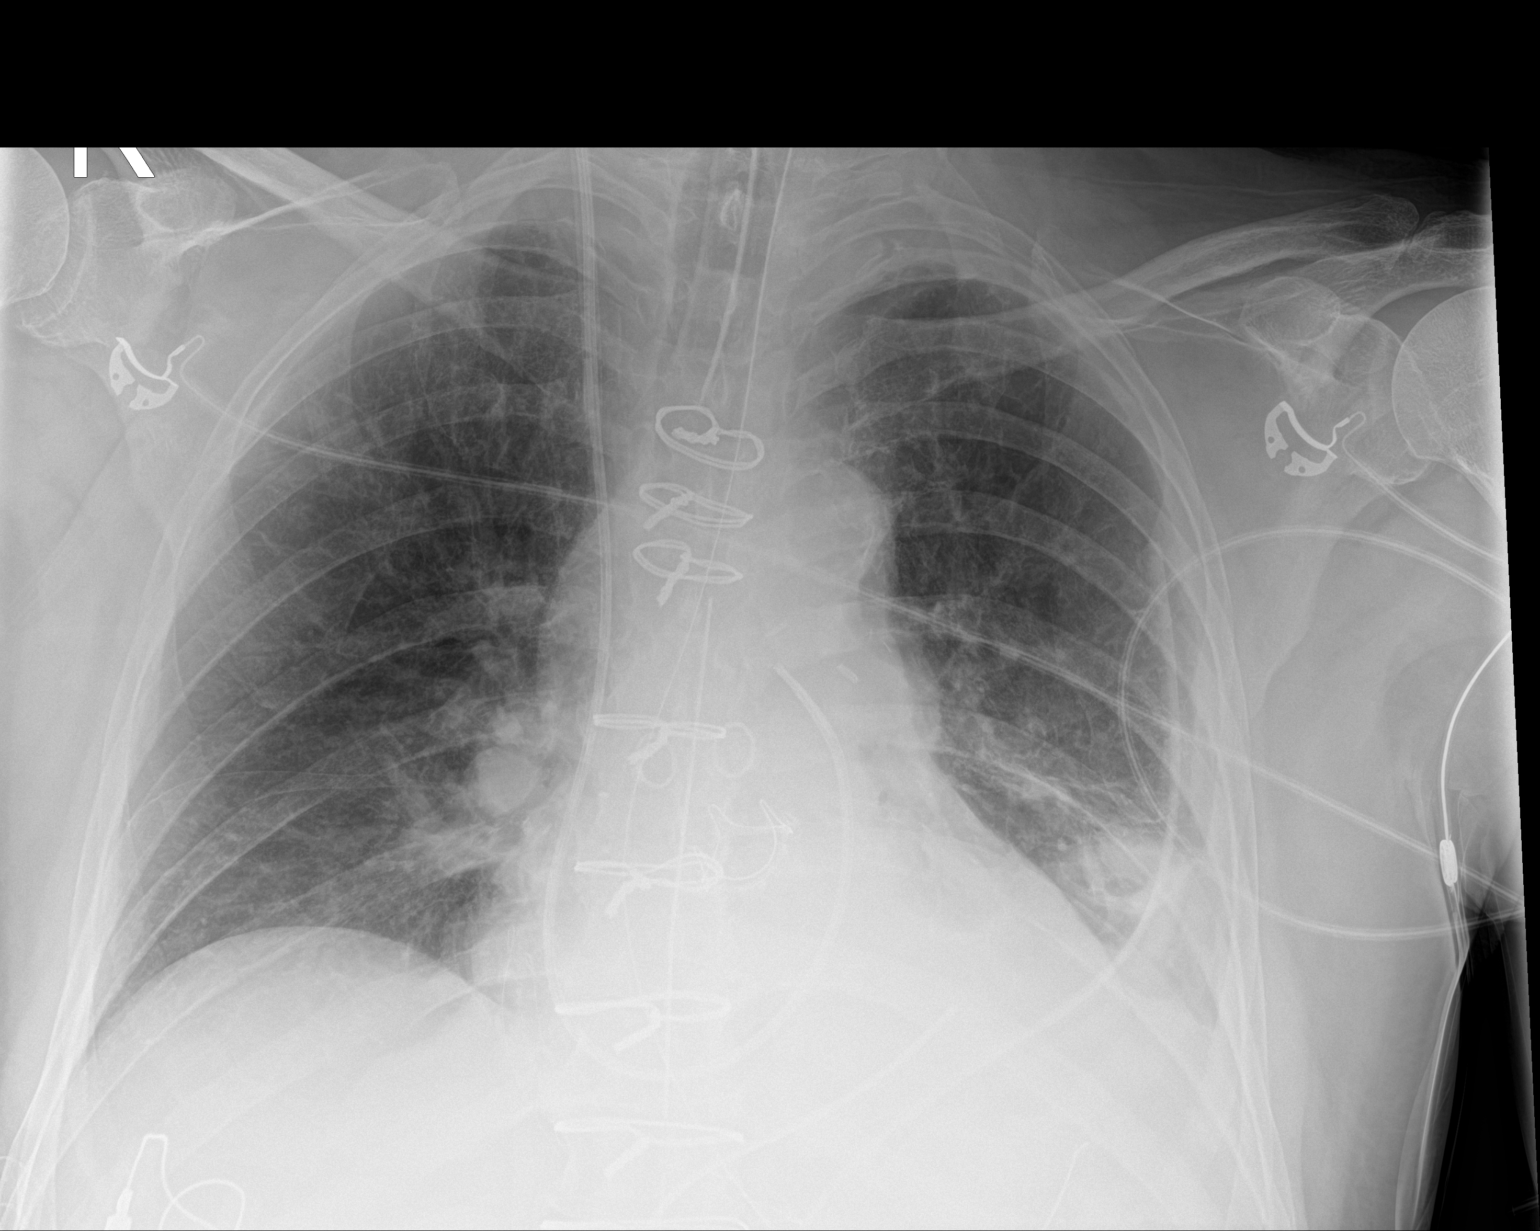

[1 of 1 positions shown; findings below may reference images not displayed]

FINDINGS: Interval intubation, tip of the endotracheal tube is 4.3 cm superior
to the carina. Esophageal tube tip is below the diaphragm but non
included. Right IJ Swan-Ganz catheter tip overlies the pulmonary
outflow tract. Interval post sternotomy change in aortic valve
replacement. Placement of mediastinal and chest drainage catheters.
Small left pleural effusion with consolidation at the left base. No
definitive pneumothorax is seen. There is mild cardiomegaly. Aortic
atherosclerosis.
IMPRESSION: 1. Placement of support lines and tubes as above
2. Interval postsurgical changes of the mediastinum
3. Small left pleural effusion with consolidation at the left lung
base
4. Mild cardiomegaly

## 2019-04-24 IMAGING — DX DG CHEST 1V PORT
1 series · 1 of 1 positions shown · non-contrast
Comparison: 10/05/2017

CLINICAL DATA: CABG

EXAM:
PORTABLE CHEST 1 VIEW

[chest]
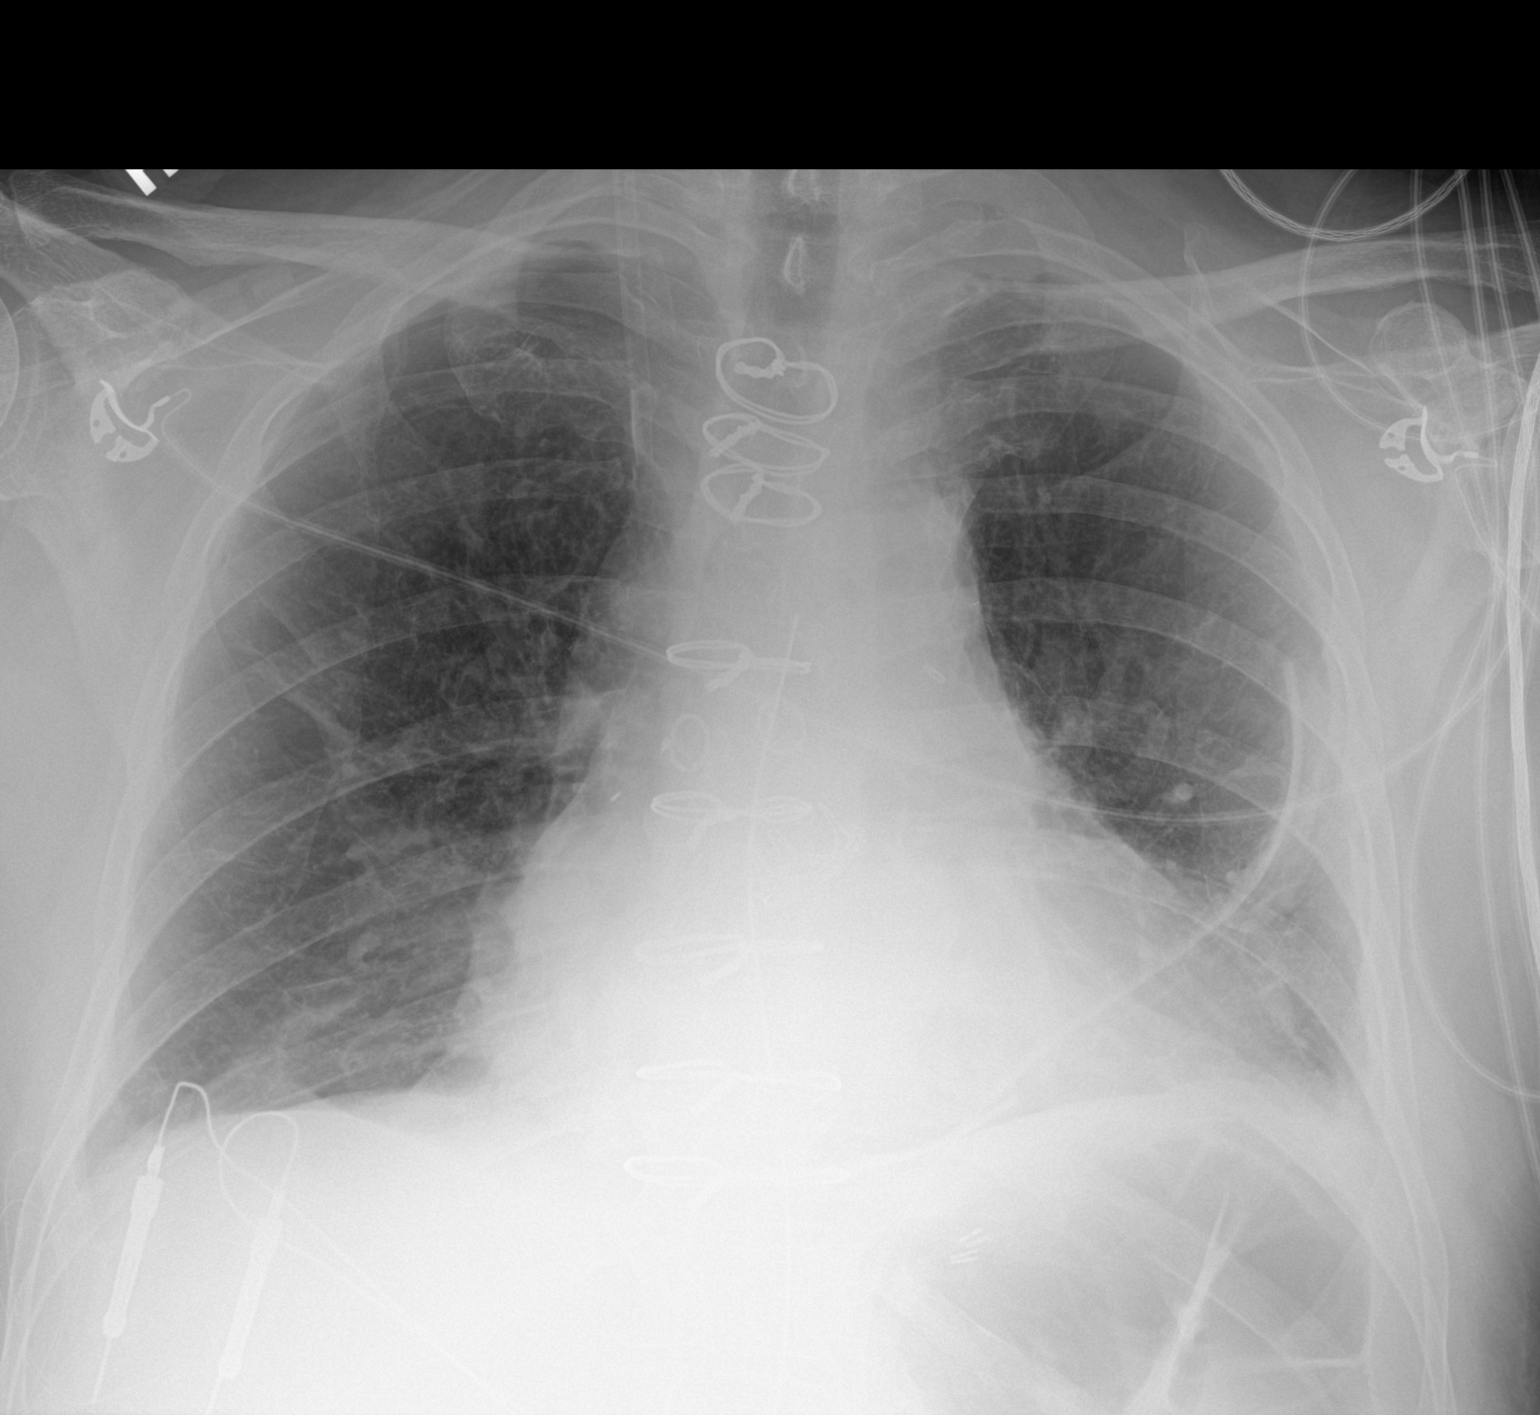

[1 of 1 positions shown; findings below may reference images not displayed]

FINDINGS: Prior CABG and valve replacement. Cardiomegaly. Bibasilar
atelectasis, slightly improved since prior study. Right pneumothorax
slightly decreased in size, likely 10-15% currently.
IMPRESSION: Bibasilar atelectasis with slight improvement in aeration of the
lung bases.

Improving right pneumothorax, 10-15% in size.

## 2019-04-25 IMAGING — DX DG CHEST 1V PORT
1 series · 1 of 1 positions shown · non-contrast
Comparison: 10/06/2017 earlier.

CLINICAL DATA: 77-year-old male postoperative day 4 status post
CABG, aortic valve replacement.

EXAM:
PORTABLE CHEST 1 VIEW

[chest]
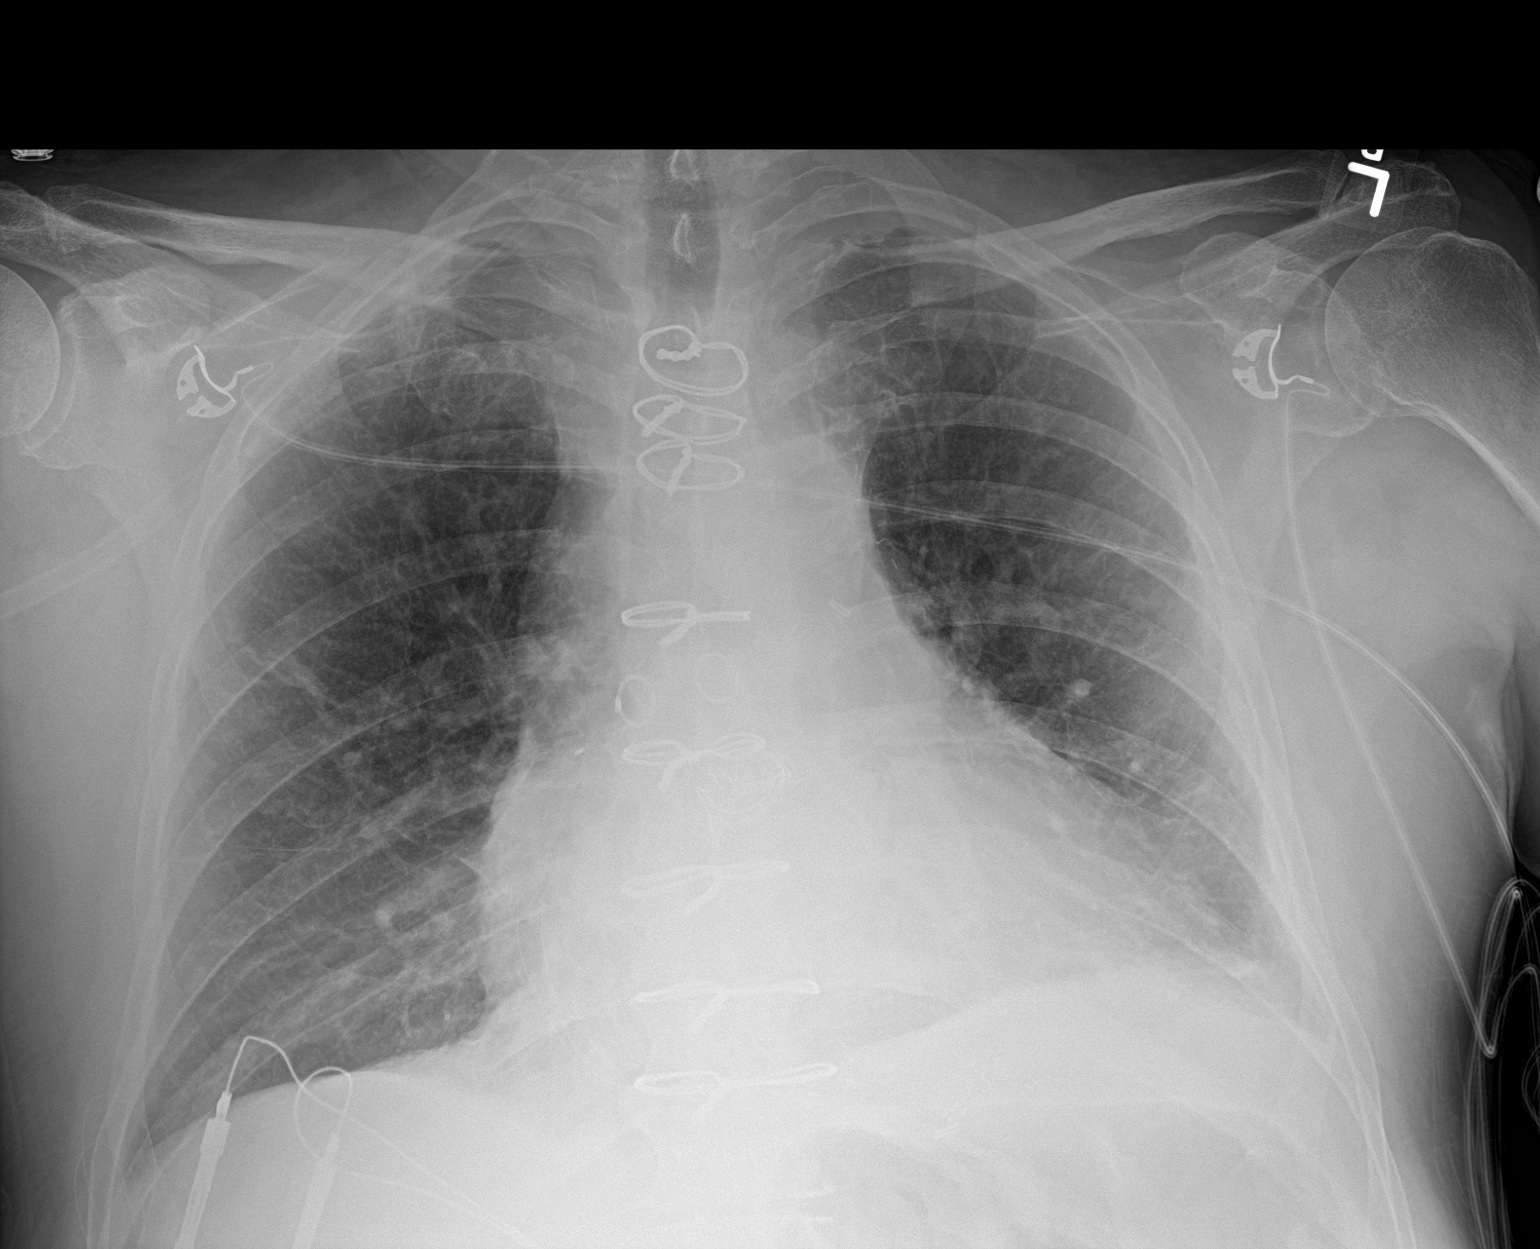

[1 of 1 positions shown; findings below may reference images not displayed]

FINDINGS: Portable AP semi upright view at 0781 hours. Right IJ introducer
sheath has been removed. Mediastinal and left chest tube have been
removed. Epicardial pacer wires remain.

No left pneumothorax identified. The small right pneumothorax seen
yesterday is no longer apparent. Mediastinal contours remain stable.
Visualized tracheal air column is within normal limits. No pulmonary
edema or consolidation. Crowding of markings and mild atelectasis.
Small if any pleural effusion.
IMPRESSION: 1. Lines and tubes removed.
2. Small right pneumothorax resolved. No pneumothorax visible today.
3. Mild atelectasis.  Small if any pleural effusion.

## 2019-05-18 ENCOUNTER — Ambulatory Visit: Payer: Medicare HMO | Admitting: Cardiovascular Disease

## 2019-06-08 ENCOUNTER — Other Ambulatory Visit: Payer: Self-pay

## 2019-06-08 ENCOUNTER — Encounter: Payer: Self-pay | Admitting: Cardiovascular Disease

## 2019-06-08 ENCOUNTER — Ambulatory Visit: Payer: Medicare HMO | Admitting: Cardiovascular Disease

## 2019-06-08 VITALS — BP 126/66 | HR 66 | Ht 71.0 in | Wt 198.8 lb

## 2019-06-08 DIAGNOSIS — E782 Mixed hyperlipidemia: Secondary | ICD-10-CM

## 2019-06-08 DIAGNOSIS — I350 Nonrheumatic aortic (valve) stenosis: Secondary | ICD-10-CM | POA: Diagnosis not present

## 2019-06-08 DIAGNOSIS — I2510 Atherosclerotic heart disease of native coronary artery without angina pectoris: Secondary | ICD-10-CM | POA: Diagnosis not present

## 2019-06-08 DIAGNOSIS — I480 Paroxysmal atrial fibrillation: Secondary | ICD-10-CM | POA: Diagnosis not present

## 2019-06-08 NOTE — Patient Instructions (Signed)
Medication Instructions:  No changes *If you need a refill on your cardiac medications before your next appointment, please call your pharmacy*   Lab Work: none If you have labs (blood work) drawn today and your tests are completely normal, you will receive your results only by: . MyChart Message (if you have MyChart) OR . A paper copy in the mail If you have any lab test that is abnormal or we need to change your treatment, we will call you to review the results.   Testing/Procedures: none   Follow-Up: At [facility], you and your health needs are our priority.  As part of our continuing mission to provide you with exceptional heart care, we have created designated Provider Care Teams.  These Care Teams include your primary Cardiologist (physician) and Advanced Practice Providers (APPs -  Physician Assistants and Nurse Practitioners) who all work together to provide you with the care you need, when you need it.  We recommend signing up for the patient portal called "MyChart".  Sign up information is provided on this After Visit Summary.  MyChart is used to connect with patients for Virtual Visits (Telemedicine).  Patients are able to view lab/test results, encounter notes, upcoming appointments, etc.  Non-urgent messages can be sent to your provider as well.   To learn more about what you can do with MyChart, go to [email or web address].    Your next appointment:   12 month(s)  The format for your next appointment:   In Person  Provider:   You [place] see Christopher McAlhany, MD or one of the following Advanced Practice Providers on your designated Care Team:    Dayna Dunn, PA-C  Michele Lenze, PA-C    Other Instructions   

## 2019-06-08 NOTE — Progress Notes (Signed)
Chief Complaint  Patient presents with  . Follow-up    CAD   History of Present Illness:79 yo male with history of severe aortic stenosis, CAD, GERD, HTN, hyperlipidemia, iron deficiency anemia, arthritis, diabetes who is here today for follow up. I saw him as a new patient for further discussion regarding his aortic stenosis and possible TAVR 09/05/17. He had been followed in the past by Dr. Bettina Gavia in [place] but most recently had been seen by Dr.McGukin in [facility] when Dr. Bettina Gavia left that practice. Echo June 2019 at [facility] with normal LV systolic function, BTDV=76-16%, mild LVH, severe aortic stenosis with mean gradient 45 mmHg and AVA 0.61 cm2. There was mild aortic valve insufficiency. He was at [facility] August 2019 to have shoulder surgery and given his severe aortic stenosis his shoulder surgery was cancelled. He also has CAD and has had a stent placed in the LAD in 2008 and a stent placed in the RCA in 2009. Last cardiac cath April 2017 with patent stents and mild non-obstructive CAD otherwise. Workup for TAVR was started when I saw him in September 2019. Cardiac cath 09/12/17 with severe three vessel CAD. We discussed CABG and surgical AVR. He was seen by Dr. Roxy Manns with CT surgery 09/20/17 and surgical AVR withi CABG was planned. He then presented to [facility] 10/02/17 with an acute lateral STEMI. This occurred after finding his son dead. His Circumflex was occluded. His Circumflex was treated with balloon angioplasty and emergent 4V CABG and AVR was performed 10/03/17. Beta blocker stopped post op due to bradycardia. He had post-op atrial fibrillation treated with amiodarone. He was in sinus at time of discharge. He had some chest pain spring of 2021. Echo March 2021 with LVEF=60-65%, normally functioning AVR. Imdur and Norvasc added and chest pain resolved. Beta blocker stopped due to bradycardia.   He is here today for follow up. The patient denies any chest pain, dyspnea, palpitations,  lower extremity edema, orthopnea, PND, dizziness, near syncope or syncope.    Primary Care Physician: Raina Mina., MD  Past Medical History:  Diagnosis Date  . Aortic valve stenosis, severe   . CAD S/P percutaneous coronary angioplasty    12-2006  STENT TO LAD;  07-2007 DEStenting TO RCA   . GERD (gastroesophageal reflux disease)   . Hypertension   . Incidental pulmonary nodule, less than or equal to 1mm 10/02/2017   Ground glass opacities RUL seen on CTA  . Iron deficiency anemia   . Mixed hyperlipidemia   . OA (osteoarthritis) of shoulder    LEFT SHOULDER AND AC JOINT  . 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 Type 2 diabetes mellitus ([place])     Past Surgical History:  Procedure Laterality Date  . AORTIC VALVE REPLACEMENT N/A 10/03/2017   Procedure: AORTIC VALVE REPLACEMENT (AVR);  Surgeon: Rexene Alberts, MD;  Location: [facility];  Service: Open Heart Surgery;  Laterality: N/A;  . APPENDECTOMY    . BACK SURGERY  x3  . CORONARY ARTERY BYPASS GRAFT N/A 10/03/2017   Procedure: CORONARY ARTERY BYPASS GRAFTING (CABG)x three, USING LEFT INTERNAL MAMMARY ARTERY AND RIGHT GREATER SAPHENOUS VEIN HARVESTED ENDOSCOPICALLY;  Surgeon: Rexene Alberts, MD;  Location: [facility];  Service: Open Heart Surgery;  Laterality:  N/A;  . CORONARY/GRAFT ACUTE MI REVASCULARIZATION N/A 10/02/2017   Procedure: Coronary/Graft Acute MI Revascularization;  Surgeon: Jettie Booze, MD;  Location: [place] [facility];  Service: Cardiovascular;  Laterality: N/A;  . LEFT HEART CATH AND CORONARY ANGIOGRAPHY N/A 10/02/2017   Procedure: LEFT HEART CATH AND CORONARY ANGIOGRAPHY;  Surgeon: Jettie Booze, MD;  Location: [place] [facility];  Service: Cardiovascular;  Laterality: N/A;  . RIGHT/LEFT HEART CATH AND CORONARY  ANGIOGRAPHY N/A 09/12/2017   Procedure: RIGHT/LEFT HEART CATH AND CORONARY ANGIOGRAPHY;  Surgeon: Burnell Blanks, MD;  Location: [place] [facility];  Service: Cardiovascular;  Laterality: N/A;  . TONSILLECTOMY      Current Outpatient Medications  Medication Sig Dispense Refill  . acetaminophen (TYLENOL) 500 MG tablet Take 2 tablets (1,000 mg total) by mouth every 6 (six) hours as needed for mild pain or fever. 30 tablet 0  . amLODipine (NORVASC) 5 MG tablet Take 1 tablet (5 mg total) by mouth at bedtime. 180 tablet 3  . aspirin EC 81 MG tablet Take 1 tablet (81 mg total) by mouth daily. 90 tablet 3  . atorvastatin (LIPITOR) 20 MG tablet TAKE 1 TABLET (20 MG TOTAL) BY MOUTH DAILY AT 6 PM. 90 tablet 3  . clopidogrel (PLAVIX) 75 MG tablet TAKE 1 TABLET BY MOUTH EVERY DAY 90 tablet 3  . Cyanocobalamin (B-12) 1000 MCG TABS Take 1 tablet by mouth daily.    . ferrous sulfate 325 (65 FE) MG EC tablet Take by mouth.    Marland Kitchen glipiZIDE (GLUCOTROL XL) 5 MG 24 hr tablet Take 5 mg by mouth 2 (two) times daily.    Marland Kitchen glucose blood (ONETOUCH ULTRA) test strip USE TO CHECK BLOOD SUGAR ONCE A DAY E11.9    . hydrochlorothiazide (MICROZIDE) 12.5 MG capsule Take 1 capsule (12.5 mg total) by mouth daily. 30 capsule 3  . HYDROcodone-acetaminophen (NORCO) 10-325 MG tablet Take 1 tablet by mouth 2 (two) times daily.     . isosorbide mononitrate (IMDUR) 30 MG 24 hr tablet Take 1 tablet (30 mg total) by mouth daily. 90 tablet 3  . lisinopril (ZESTRIL) 20 MG tablet Take 1 tablet (20 mg total) by mouth daily. 90 tablet 3  . metFORMIN (GLUCOPHAGE) 500 MG tablet Take 500 mg by mouth 2 (two) times daily.     . nitroGLYCERIN (NITROSTAT) 0.4 MG SL tablet Place 1 tablet (0.4 mg total) under the tongue every 5 (five) minutes as needed for chest pain. 25 tablet 3  . pantoprazole (PROTONIX) 40 MG tablet Take 40 mg by mouth at bedtime.     . Polyethylene Glycol 3350 (MIRALAX PO) Take 17 g by mouth daily. WITH COFFEE    . Wheat  Dextrin (BENEFIBER) POWD Benefiber, Mix 1 tablespoon with a liquid and drink once daily.    Marland Kitchen zolpidem (AMBIEN) 10 MG tablet Take 10 mg by mouth at bedtime as needed for sleep.     No current facility-administered medications for this visit.    Allergies  Allergen Reactions  . Contrast Media [Iodinated Diagnostic Agents] Hives  . Penicillins Hives    Has patient had a PCN reaction causing immediate rash, facial/tongue/throat swelling, SOB or lightheadedness with hypotension: unkn Has patient had a PCN reaction causing severe rash involving mucus membranes or skin necrosis: unkn Has patient had a PCN reaction that required hospitalization: unkn Has patient had a PCN reaction occurring within the last 10 years: no If all of the above answers are "NO", then [place]  proceed with Cephalosporin use.     Social History   Socioeconomic History  . Marital status: Married    Spouse name: Lenell Antu  . Number of children: 2  . Years of education: Not on file  . Highest education level: Not on file  Occupational History  . Occupation: Retired-worked at [employer]  . Smoking status: Former Smoker    Years: 40.00    Quit date: 2002    Years since quitting: 19.4  . Smokeless tobacco: Never Used  Substance and Sexual Activity  . Alcohol use: Never  . Drug use: Never  . Sexual activity: Not on file  Other Topics Concern  . Not on file  Social History Narrative  . Not on file   Social Determinants of Health   Financial Resource Strain:   . Difficulty of Paying Living Expenses:   Food Insecurity:   . Worried About Charity fundraiser in the Last Year:   . Arboriculturist in the Last Year:   Transportation Needs:   . Film/video editor (Medical):   Marland Kitchen Lack of Transportation (Non-Medical):   Physical Activity:   . Days of Exercise per Week:   . Minutes of Exercise per Session:   Stress:   . Feeling of Stress :   Social Connections:   . Frequency of Communication with  Friends and Family:   . Frequency of Social Gatherings with Friends and Family:   . Attends Religious Services:   . Active Member of Clubs or Organizations:   . Attends Archivist Meetings:   Marland Kitchen Marital Status:   Intimate Partner Violence:   . Fear of Current or Ex-Partner:   . Emotionally Abused:   Marland Kitchen Physically Abused:   . Sexually Abused:     Family History  Problem Relation Age of Onset  . CVA Father   . Heart attack Sister   . Heart attack Brother        3 brothers with CAD    Review of Systems:  As stated in the HPI and otherwise negative.   BP 126/66   Pulse 66   Ht 5\' 11"  (1.803 m)   Wt 198 lb 12.8 oz (90.2 kg)   SpO2 94%   BMI 27.73 kg/m   Physical Examination: General: Well developed, well nourished, NAD  HEENT: OP clear, mucus membranes moist  SKIN: warm, dry. No rashes. Neuro: No focal deficits  Musculoskeletal: Muscle strength 5/5 all ext  Psychiatric: Mood and affect normal  Neck: No JVD, no carotid bruits, no thyromegaly, no lymphadenopathy.  Lungs:Clear bilaterally, no wheezes, rhonci, crackles Cardiovascular: Regular rate and rhythm. No murmurs, gallops or rubs. Abdomen:Soft. Bowel sounds present. Non-tender.  Extremities: No lower extremity edema. Pulses are 2 + in the bilateral DP/PT.  EKG:  EKG is not ordered today. The ekg ordered today demonstrates    Recent Labs: 03/23/2019: ALT 17; Hemoglobin 14.6; Platelets 173 04/08/2019: BUN 26; Creatinine, Ser 1.28; Potassium 4.4; Sodium 143   Lipid Panel    Component Value Date/Time   CHOL 115 03/23/2019 1130   TRIG 215 (H) 03/23/2019 1130   HDL 32 (L) 03/23/2019 1130   CHOLHDL 3.6 03/23/2019 1130   LDLCALC 48 03/23/2019 1130     Wt Readings from Last 3 Encounters:  06/08/19 198 lb 12.8 oz (90.2 kg)  04/08/19 200 lb 12.8 oz (91.1 kg)  03/30/19 208 lb 12.8 oz (94.7 kg)     Other studies Reviewed: Additional  studies/ records that were reviewed today include: .office records, EKG, echo  report Review of the above records demonstrates:    Assessment and Plan:   1. Severe aortic valve stenosis: He is s/p AVR on 10/03/17. Continue Plavix. Will stop ASA. SBE prophylaxis when indicated.   2. CAD s/p CABG without angina: NO chest pain suggestive of angina. Continue Plavix, statin and Imdur. Beta blocker stopped due to bradycardia. Will stop ASA due to easy bruising  3. Post-op atrial fibrillation: He appers to be in sinus today.   4. Hyperlipidemia: LDL at goal. Continue statin.    Current medicines are reviewed at length with the patient today.  The patient does not have concerns regarding medicines.  The following changes have been made:  no change  Labs/ tests ordered today include:   No orders of the defined types were placed in this encounter.    Disposition:   FU with me in12 months   Signed, Lauree Chandler, MD 06/08/2019 11:22 AM    [facility] [place], [place], [place]  [address] Phone: [phone]; Fax: [phone]

## 2019-06-15 ENCOUNTER — Other Ambulatory Visit: Payer: Self-pay | Admitting: Nurse Practitioner

## 2019-11-08 ENCOUNTER — Other Ambulatory Visit: Payer: Self-pay | Admitting: Cardiovascular Disease

## 2019-11-08 ENCOUNTER — Other Ambulatory Visit: Payer: Self-pay | Admitting: Physician Assistant

## 2019-12-28 ENCOUNTER — Other Ambulatory Visit: Payer: Self-pay | Admitting: Nurse Practitioner

## 2019-12-30 ENCOUNTER — Other Ambulatory Visit: Payer: Self-pay | Admitting: Hematology and Oncology

## 2019-12-30 DIAGNOSIS — D472 Monoclonal gammopathy: Secondary | ICD-10-CM

## 2020-01-13 ENCOUNTER — Inpatient Hospital Stay: Payer: Medicare HMO

## 2020-01-19 ENCOUNTER — Ambulatory Visit: Payer: Medicare HMO | Admitting: Oncology

## 2020-01-29 DIAGNOSIS — D472 Monoclonal gammopathy: Secondary | ICD-10-CM | POA: Insufficient documentation

## 2020-02-02 ENCOUNTER — Other Ambulatory Visit: Payer: Medicare HMO

## 2020-02-04 ENCOUNTER — Other Ambulatory Visit: Payer: Self-pay | Admitting: Oncology

## 2020-02-04 ENCOUNTER — Inpatient Hospital Stay: Payer: Medicare HMO | Attending: Oncology

## 2020-02-04 ENCOUNTER — Other Ambulatory Visit: Payer: Self-pay | Admitting: Hematology and Oncology

## 2020-02-04 ENCOUNTER — Other Ambulatory Visit: Payer: Self-pay

## 2020-02-04 ENCOUNTER — Telehealth: Payer: Self-pay | Admitting: Oncology

## 2020-02-04 ENCOUNTER — Inpatient Hospital Stay (INDEPENDENT_AMBULATORY_CARE_PROVIDER_SITE_OTHER): Payer: Medicare HMO | Admitting: Oncology

## 2020-02-04 DIAGNOSIS — D472 Monoclonal gammopathy: Secondary | ICD-10-CM | POA: Diagnosis present

## 2020-02-04 LAB — COMPREHENSIVE METABOLIC PANEL
Albumin: 4.6 (ref 3.5–5.0)
Calcium: 9.4 (ref 8.7–10.7)

## 2020-02-04 LAB — HEPATIC FUNCTION PANEL
ALT: 19 (ref 10–40)
AST: 24 (ref 14–40)
Alkaline Phosphatase: 67 (ref 25–125)
Bilirubin, Total: 0.5

## 2020-02-04 LAB — BASIC METABOLIC PANEL
BUN: 28 — AB (ref 4–21)
CO2: 27 — AB (ref 13–22)
Chloride: 105 (ref 99–108)
Creatinine: 1.4 — AB (ref 0.6–1.3)
Glucose: 251
Potassium: 4.5 (ref 3.4–5.3)
Sodium: 140 (ref 137–147)

## 2020-02-04 LAB — CBC AND DIFFERENTIAL
HCT: 44 (ref 41–53)
Hemoglobin: 15 (ref 13.5–17.5)
Neutrophils Absolute: 5.98
Platelets: 140 — AB (ref 150–399)
WBC: 9.2

## 2020-02-04 LAB — CBC
MCV: 95 — AB (ref 80–94)
RBC: 4.59 (ref 3.87–5.11)

## 2020-02-04 NOTE — Progress Notes (Signed)
[facility]  [address] [place],  [place]  [address] [phone]  Clinic Day:  02/04/2020  Referring physician: Gordan Payment., MD   HISTORY OF PRESENT ILLNESS:  The patient is a 80 y.o. 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  Since his last visit, the patient has been doing very well.  He continues to deny having fatigue, bone pain, or other symptoms which concern him for his MGUS potentially progressing into multiple myeloma.  PHYSICAL EXAM:  Blood pressure 135/69, pulse 64, temperature 97.9 F (36.6 C), resp. rate 18, height 5\' 11"  (1.803 m), weight 194 lb 12.8 oz (88.4 kg), SpO2 96 %. Wt Readings from Last 3 Encounters:  02/04/20 194 lb 12.8 oz (88.4 kg)  06/08/19 198 lb 12.8 oz (90.2 kg)  04/08/19 200 lb 12.8 oz (91.1 kg)   Body mass index is 27.17 kg/m. Performance status (ECOG): 1 Physical Exam Constitutional:      Appearance: Normal appearance. He is not ill-appearing.  HENT:     Mouth/Throat:     Mouth: Mucous membranes are moist.     Pharynx: Oropharynx is clear. No oropharyngeal exudate or posterior oropharyngeal erythema.  Cardiovascular:     Rate and Rhythm: Normal rate and regular rhythm.     Heart sounds: No murmur heard. No friction rub. No gallop.   Pulmonary:     Effort: Pulmonary effort is normal. No respiratory distress.     Breath sounds: Normal breath sounds. No wheezing, rhonchi or rales.  Chest:  Breasts:     Right: No axillary adenopathy or supraclavicular adenopathy.     Left: No axillary adenopathy or supraclavicular adenopathy.    Abdominal:     General: Bowel sounds are normal. There is no distension.     Palpations: Abdomen is soft. There is no mass.     Tenderness: There is no abdominal tenderness.  Musculoskeletal:        General: No swelling.     Right lower leg: No edema.     Left lower leg: No edema.   Lymphadenopathy:     Cervical: No cervical adenopathy.     Upper Body:     Right upper body: No supraclavicular or axillary adenopathy.     Left upper body: No supraclavicular or axillary adenopathy.     Lower Body: No right inguinal adenopathy. No left inguinal adenopathy.  Skin:    General: Skin is warm.     Coloration: Skin is not jaundiced.     Findings: No lesion or rash.  Neurological:     General: No focal deficit present.     Mental Status: He is alert and oriented to person, place, and time. Mental status is at baseline.     Cranial Nerves: Cranial nerves are intact.  Psychiatric:        Mood and Affect: Mood normal.        Behavior: Behavior normal.        Thought Content: Thought content normal.     LABS:   CBC Latest Ref Rng & Units 02/04/2020 03/23/2019 10/08/2017  WBC - 9.2 7.8 13.8(H)  Hemoglobin 13.5 - 17.5 15.0 14.6 12.2(L)  Hematocrit 41 - 53 44 43.6 38.5(L)  Platelets 150 - 399 140(A) 173 293   CMP Latest Ref Rng & Units 02/04/2020 04/08/2019 03/23/2019  Glucose 65 - 99 mg/dL - 244(W) 102(V)  BUN 4 -  21 28(A) 26 20  Creatinine 0.6 - 1.3 1.4(A) 1.28(H) 1.16  Sodium 137 - 147 140 143 146(H)  Potassium 3.4 - 5.3 4.5 4.4 4.1  Chloride 99 - 108 105 103 106  CO2 13 - 22 27(A) 23 24  Calcium 8.7 - 10.7 9.4 10.3(H) 9.8  Total Protein 6.0 - 8.5 g/dL - - 7.6  Total Bilirubin 0.0 - 1.2 mg/dL - - 0.5  Alkaline Phos 25 - 125 67 - 81  AST 14 - 40 24 - 15  ALT 10 - 40 19 - 17      ASSESSMENT & PLAN:  Assessment/Plan:  A 80 y.o. 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  In clinic today, I went over all of his recent labs with him, which continue to show no signs of his MGUS progressing into a more aggressive plasma cell dyscrasia, such as multiple myeloma.  Clinically, he is doing very well.  As that is the case, I will see him back in 1 year to reassess his MGUS. The patient understands all the plans discussed today and is in agreement with them.       Rolf Fells Kirby Funk, MD

## 2020-02-04 NOTE — Telephone Encounter (Signed)
Per 2/3 los next appt sched and given to patient 

## 2020-02-08 LAB — PROTEIN ELECTROPHORESIS, SERUM
A/G Ratio: 1.3 (ref 0.7–1.7)
Albumin ELP: 4.1 g/dL (ref 2.9–4.4)
Alpha-1-Globulin: 0.2 g/dL (ref 0.0–0.4)
Alpha-2-Globulin: 0.7 g/dL (ref 0.4–1.0)
Beta Globulin: 1 g/dL (ref 0.7–1.3)
Gamma Globulin: 1.4 g/dL (ref 0.4–1.8)
Globulin, Total: 3.2 g/dL (ref 2.2–3.9)
M-Spike, %: 1 g/dL — ABNORMAL HIGH
Total Protein ELP: 7.3 g/dL (ref 6.0–8.5)

## 2020-02-29 ENCOUNTER — Telehealth: Payer: Self-pay | Admitting: *Deleted

## 2020-02-29 NOTE — Telephone Encounter (Signed)
   [facility] Pre-operative Risk Assessment    HEARTCARE STAFF: - Please ensure there is not already an duplicate clearance open for this procedure. - Under Visit Info/Reason for Call, type in Other and utilize the format Clearance MM/DD/YY or Clearance TBD. Do not use dashes or single digits. - If request is for dental extraction, please clarify the # of teeth to be extracted.  Request for surgical clearance:  1. What type of surgery is being performed? COLONOSCOPY/EGD   2. When is this surgery scheduled? 03/25/20   3. What type of clearance is required (medical clearance vs. Pharmacy clearance to hold med vs. Both)? MEDICAL  4. Are there any medications that need to be held prior to surgery and how long? ASA AND PLAVIX (ASKING TO STOP PLAVIX ON 03/20/20)  5. Practice name and name of physician performing surgery? [facility]; DR.    6. What is the office phone number? [phone]   7.   What is the office fax number? [phone]  8.   Anesthesia type (None, local, MAC, general) ? NOT LISTED   Erik Keith 02/29/2020, 12:30 PM  _________________________________________________________________   (Erik Keith comments below)

## 2020-02-29 NOTE — Telephone Encounter (Signed)
ADDENDUM: REQUESTING OFFICE ASKING PT IS GOING TO NEED ANTIBIOTICS PRIOR TO PROCEDURE.  SURGEON: DR. Christia Reading MISENHEIMER

## 2020-03-01 NOTE — Telephone Encounter (Signed)
Pt seeing you 03/10/20.  Please send clearance to surgeon. Richardson Dopp, PA-C    03/01/2020 11:06 AM

## 2020-03-01 NOTE — Telephone Encounter (Signed)
Called to s/w pt to schedule pre op appt, s/w pt's wife, as pt was not available. Pt has been scheduled to see Kerin Ransom, [facility] 03/10/20 @ 10:45. Nothing available at [facility] location as it stated the Plavix will need to be held as 3/20 for for 2/25 procedure. Pt's wife thanked me for the help. Pt's wife is aware of appt at the [facility] office and understands where [facility] office is located.

## 2020-03-01 NOTE — Telephone Encounter (Signed)
His Plavix can be held 5 days before the procedure. He can also hold ASA if he is still taking this and it is required for the procedure. Lauree Chandler

## 2020-03-01 NOTE — Telephone Encounter (Signed)
   Primary Cardiologist: Lauree Chandler, MD  Chart reviewed as part of pre-operative protocol coverage.  Last OV in June 2021.  Because of Erik Keith's past medical history and time since last visit, he will require a follow-up visit in order to better assess preoperative cardiovascular risk.  Pre-op covering staff: - Please schedule appointment and call patient to inform them. If patient already had an upcoming appointment within acceptable timeframe, please add "pre-op clearance" to the appointment notes so provider is aware. - Please contact requesting surgeon's office via preferred method (i.e, phone, fax) to inform them of need for appointment prior to surgery. - I will fwd to Dr. Angelena Form to comment on +/- holding Plavix (last note indicates ASA was to be stopped but still on pt med list).  Richardson Dopp, PA-C  03/01/2020, 9:31 AM

## 2020-03-10 ENCOUNTER — Encounter: Payer: Self-pay | Admitting: Cardiology

## 2020-03-10 ENCOUNTER — Other Ambulatory Visit: Payer: Self-pay

## 2020-03-10 ENCOUNTER — Ambulatory Visit: Payer: Medicare HMO | Admitting: Cardiology

## 2020-03-10 VITALS — BP 121/69 | HR 83 | Ht 71.0 in | Wt 193.4 lb

## 2020-03-10 DIAGNOSIS — I10 Essential (primary) hypertension: Secondary | ICD-10-CM

## 2020-03-10 DIAGNOSIS — Z951 Presence of aortocoronary bypass graft: Secondary | ICD-10-CM | POA: Diagnosis not present

## 2020-03-10 DIAGNOSIS — Z01818 Encounter for other preprocedural examination: Secondary | ICD-10-CM | POA: Insufficient documentation

## 2020-03-10 DIAGNOSIS — Z953 Presence of xenogenic heart valve: Secondary | ICD-10-CM | POA: Diagnosis not present

## 2020-03-10 DIAGNOSIS — E782 Mixed hyperlipidemia: Secondary | ICD-10-CM

## 2020-03-10 DIAGNOSIS — E1159 Type 2 diabetes mellitus with other circulatory complications: Secondary | ICD-10-CM

## 2020-03-10 NOTE — Patient Instructions (Signed)
Medication Instructions:  The current medical regimen is effective;  continue present plan and medications as directed. Please refer to the Current Medication list given to you today.  *If you need a refill on your cardiac medications before your next appointment, please call your pharmacy*  Lab Work:   Testing/Procedures:  NONE    NONE  Special Instructions CLEARED TO PROCEDURE  Follow-Up: Your next appointment:  KEEP SCHEDULED APPOINTMENT  In Person with Lauree Chandler, MD   At [facility], you and your health needs are our priority.  As part of our continuing mission to provide you with exceptional heart care, we have created designated Provider Care Teams.  These Care Teams include your primary Cardiologist (physician) and Advanced Practice Providers (APPs -  Physician Assistants and Nurse Practitioners) who all work together to provide you with the care you need, when you need it.  We recommend signing up for the patient portal called "MyChart".  Sign up information is provided on this After Visit Summary.  MyChart is used to connect with patients for Virtual Visits (Telemedicine).  Patients are able to view lab/test results, encounter notes, upcoming appointments, etc.  Non-urgent messages can be sent to your provider as well.   To learn more about what you can do with MyChart, go to [email or web address].

## 2020-03-10 NOTE — Assessment & Plan Note (Signed)
23 mm Edwards Inspiris Resilia stented bioprosthetic tissue valve Echo march 2021

## 2020-03-10 NOTE — Telephone Encounter (Signed)
   Primary Cardiologist: Lauree Chandler, MD  Chart reviewed and patient seen today in th office as part of pre-operative protocol coverage. Given past medical history and time since last visit, based on ACC/AHA guidelines, Erik Keith would be at acceptable risk for the planned procedure without further cardiovascular testing.   OK to hold Plavix 5 days pre op.   The patient was advised that if he develops new symptoms prior to surgery to contact our office to arrange for a follow-up visit, and he verbalized understanding.  I will route this recommendation to the requesting party via Epic fax function and remove from pre-op pool.  Please call with questions.  Kerin Ransom, PA-C 03/10/2020, 11:10 AM

## 2020-03-10 NOTE — Assessment & Plan Note (Signed)
LIMA to LAD, Sequential SVG to OM1-OM2, SVG to PDA- no angina

## 2020-03-10 NOTE — Assessment & Plan Note (Signed)
The patient will be cleared for colonoscopy and endoscopy.  Okay to hold Plavix 5 days preop and resume postop.

## 2020-03-10 NOTE — Assessment & Plan Note (Signed)
Controlled.  

## 2020-03-10 NOTE — Progress Notes (Signed)
Cardiology Office Note:    Date:  03/10/2020   ID:  Erik Keith, DOB [birth year], MRN [id number]  PCP:  Erik Keith., MD  Cardiologist:  Erik Chandler, MD  Electrophysiologist:  None   Referring MD: Erik Keith., MD   No chief complaint on file. Pre op clearance  History of Present Illness:    Erik Keith is a 80 y.o. male with a hx of CABG October 2019 with AVR.  He is in the office today for preoperative clearance prior to colonoscopy and endoscopy.  He is on Plavix, he was previously taken off aspirin.  From a cardiac standpoint he is done well.  He walks 20 minutes a day on a treadmill with no chest pain.  He is not had tachycardia or palpitations.  Said no issues with his medications.  Past Medical History:  Diagnosis Date  . Aortic valve stenosis, severe   . CAD S/P percutaneous coronary angioplasty    12-2006  STENT TO LAD;  07-2007 DEStenting TO RCA   . GERD (gastroesophageal reflux disease)   . Hypertension   . Incidental pulmonary nodule, less than or equal to 20mm 10/02/2017   Ground glass opacities RUL seen on CTA  . Iron deficiency anemia   . Mixed hyperlipidemia   . OA (osteoarthritis) of shoulder    LEFT SHOULDER AND AC JOINT  . 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 Type 2 diabetes mellitus ([place])     Past Surgical History:  Procedure Laterality Date  . AORTIC VALVE REPLACEMENT N/A 10/03/2017   Procedure: AORTIC VALVE REPLACEMENT (AVR);  Surgeon: Rexene Alberts, MD;  Location: [place];  Service: Open Heart Surgery;  Laterality: N/A;  . APPENDECTOMY    . BACK SURGERY  x3  . CORONARY ARTERY BYPASS GRAFT N/A 10/03/2017   Procedure: CORONARY ARTERY BYPASS GRAFTING (CABG)x three, USING LEFT INTERNAL MAMMARY ARTERY AND RIGHT GREATER SAPHENOUS VEIN  HARVESTED ENDOSCOPICALLY;  Surgeon: Rexene Alberts, MD;  Location: [place];  Service: Open Heart Surgery;  Laterality: N/A;  . CORONARY/GRAFT ACUTE MI REVASCULARIZATION N/A 10/02/2017   Procedure: Coronary/Graft Acute MI Revascularization;  Surgeon: Jettie Booze, MD;  Location: [place] [facility];  Service: Cardiovascular;  Laterality: N/A;  . LEFT HEART CATH AND CORONARY ANGIOGRAPHY N/A 10/02/2017   Procedure: LEFT HEART CATH AND CORONARY ANGIOGRAPHY;  Surgeon: Jettie Booze, MD;  Location: [place] [facility];  Service: Cardiovascular;  Laterality: N/A;  . RIGHT/LEFT HEART CATH AND CORONARY ANGIOGRAPHY N/A 09/12/2017   Procedure: RIGHT/LEFT HEART CATH AND CORONARY ANGIOGRAPHY;  Surgeon: Burnell Blanks, MD;  Location: [place] [facility];  Service: Cardiovascular;  Laterality: N/A;  . TONSILLECTOMY      Current Medications: Current Meds  Medication Sig  . acetaminophen (TYLENOL) 500 MG tablet Take 2 tablets (1,000 mg total) by mouth every 6 (six) hours as needed for mild pain or fever.  Marland Kitchen amLODipine (NORVASC) 10 MG tablet Take by mouth.  Marland Kitchen atorvastatin (LIPITOR) 20 MG tablet TAKE 1 TABLET (20 MG TOTAL) BY MOUTH DAILY AT 6 PM.  . clopidogrel (PLAVIX) 75 MG tablet TAKE 1 TABLET BY MOUTH EVERY DAY  . Cyanocobalamin (B-12) 1000 MCG TABS Take 1 tablet by mouth daily.  Marland Kitchen glipiZIDE (GLUCOTROL XL) 5 MG  24 hr tablet Take 1 tablet by mouth 2 (two) times daily.  Marland Kitchen glucose blood (ONETOUCH ULTRA) test strip USE TO CHECK BLOOD SUGAR ONCE A DAY E11.9  . hydrochlorothiazide (MICROZIDE) 12.5 MG capsule TAKE 1 CAPSULE BY MOUTH EVERY DAY  . HYDROcodone-acetaminophen (NORCO) 10-325 MG tablet Take 1 tablet by mouth 2 (two) times daily.  . isosorbide mononitrate (IMDUR) 30 MG 24 hr tablet TAKE 1 TABLET BY MOUTH EVERY DAY  . lisinopril (ZESTRIL) 20 MG tablet TAKE 1 TABLET BY MOUTH EVERY DAY  . metFORMIN (GLUCOPHAGE) 500 MG tablet Take 1 tablet by mouth 2 (two) times daily with a meal.  .  nitroGLYCERIN (NITROSTAT) 0.4 MG SL tablet Place 1 tablet (0.4 mg total) under the tongue every 5 (five) minutes as needed for chest pain.  . pantoprazole (PROTONIX) 40 MG tablet Take 1 tablet by mouth daily.  . Polyethylene Glycol 3350 (MIRALAX PO) Take 17 g by mouth daily. WITH COFFEE  . Wheat Dextrin (BENEFIBER) POWD Benefiber, Mix 1 tablespoon with a liquid and drink once daily.  Marland Kitchen zolpidem (AMBIEN) 10 MG tablet Take 10 mg by mouth at bedtime as needed for sleep.  . [DISCONTINUED] aspirin EC 81 MG tablet Take 1 tablet (81 mg total) by mouth daily.  . [DISCONTINUED] ferrous sulfate 325 (65 FE) MG EC tablet Take by mouth.  . [DISCONTINUED] glipiZIDE (GLUCOTROL XL) 5 MG 24 hr tablet Take 5 mg by mouth 2 (two) times daily.  . [DISCONTINUED] hydrochlorothiazide (HYDRODIURIL) 12.5 MG tablet Take by mouth.  . [DISCONTINUED] metFORMIN (GLUCOPHAGE) 500 MG tablet Take 500 mg by mouth 2 (two) times daily.   . [DISCONTINUED] methocarbamol (ROBAXIN) 750 MG tablet Take by mouth.  . [DISCONTINUED] pantoprazole (PROTONIX) 40 MG tablet Take 40 mg by mouth at bedtime.   . [DISCONTINUED] sucralfate (CARAFATE) 1 g tablet Take 1 g by mouth 4 (four) times daily.     Allergies:   Contrast media [iodinated diagnostic agents] and Penicillins   Social History   Socioeconomic History  . Marital status: Married    Spouse name: Lenell Antu  . Number of children: 2  . Years of education: Not on file  . Highest education level: Not on file  Occupational History  . Occupation: Retired-worked at [employer]  . Smoking status: Former Smoker    Years: 40.00    Quit date: 2002    Years since quitting: 20.2  . Smokeless tobacco: Never Used  Vaping Use  . Vaping Use: Unknown  Substance and Sexual Activity  . Alcohol use: Never  . Drug use: Never  . Sexual activity: Not on file  Other Topics Concern  . Not on file  Social History Narrative  . Not on file   Social Determinants of Health    Financial Resource Strain: Not on file  Food Insecurity: Not on file  Transportation Needs: Not on file  Physical Activity: Not on file  Stress: Not on file  Social Connections: Not on file     Family History: The patient's family history includes CVA in his father; Heart attack in his brother and sister.  ROS:   Please see the history of present illness.     All other systems reviewed and are negative.  EKGs/Labs/Other Studies Reviewed:    The following studies were reviewed today: Echo 03/23/2019- IMPRESSIONS    1. Left ventricular ejection fraction, by estimation, is 60 to 65%. The  left ventricle has normal function. The left ventricle has no regional  wall motion abnormalities. There is mild concentric left ventricular  hypertrophy. Left ventricular diastolic  parameters are indeterminate.  2. Right ventricular systolic function is normal. The right ventricular  size is normal.  3. The mitral valve is normal in structure. Mild mitral valve  regurgitation. No evidence of mitral stenosis.  4. The aortic valve gradient is likely normal for this prosthetic AV and  is unchanged from his previous echo from Feb. 2020.   Marland Kitchen The aortic valve has been repaired/replaced. Aortic valve  regurgitation is not visualized. There is a 23 mm Edwards Inspiris Resilia  bovine valve present in the aortic position. Procedure Date: 10/03/17.   Comparison(s): 02/25/18 EF 60-65%.  EKG:  EKG is  ordered today.  The ekg ordered today demonstrates NSR, HR 83  Recent Labs: 02/04/2020: ALT 19; BUN 28; Creatinine 1.4; Hemoglobin 15.0; Platelets 140; Potassium 4.5; Sodium 140  Recent Lipid Panel    Component Value Date/Time   CHOL 115 03/23/2019 1130   TRIG 215 (H) 03/23/2019 1130   HDL 32 (L) 03/23/2019 1130   CHOLHDL 3.6 03/23/2019 1130   LDLCALC 48 03/23/2019 1130    Physical Exam:    VS:  BP 121/69   Pulse 83   Ht 5\' 11"  (1.803 m)   Wt 193 lb 6.4 oz (87.7 kg)   SpO2 97%   BMI  26.97 kg/m     Wt Readings from Last 3 Encounters:  03/10/20 193 lb 6.4 oz (87.7 kg)  02/04/20 194 lb 12.8 oz (88.4 kg)  06/08/19 198 lb 12.8 oz (90.2 kg)     GEN: Well nourished, well developed in no acute distress HEENT: Normal NECK: No JVD; No carotid bruits LYMPHATICS: No lymphadenopathy CARDIAC: RRR, 2/6 SEM, no rubs, gallops RESPIRATORY:  Faint basilar crackles Lt base ABDOMEN: Soft, non-distended MUSCULOSKELETAL:  No edema; No deformity  SKIN: Warm and dry NEUROLOGIC:  Alert and oriented x 3 PSYCHIATRIC:  Normal affect   ASSESSMENT:    Pre-operative clearance The patient will be cleared for colonoscopy and endoscopy.  Okay to hold Plavix 5 days preop and resume postop.  S/P CABG x 4 LIMA to LAD, Sequential SVG to OM1-OM2, SVG to PDA- no angina  S/P aortic valve replacement with bioprosthetic valve  23 mm Edwards Inspiris Resilia stented bioprosthetic tissue valve Echo march 2021  Diabetes mellitus ([place]) NIDDM  Essential hypertension Controlled  Mixed hyperlipidemia LDL 48 March 2021  PLAN:    OK for GI procedure- I will fax clearance   Medication Adjustments/Labs and Tests Ordered: Current medicines are reviewed at length with the patient today.  Concerns regarding medicines are outlined above.  Orders Placed This Encounter  Procedures  . EKG 12-Lead   No orders of the defined types were placed in this encounter.   Patient Instructions  Medication Instructions:  The current medical regimen is effective;  continue present plan and medications as directed. Please refer to the Current Medication list given to you today.  *If you need a refill on your cardiac medications before your next appointment, please call your pharmacy*  Lab Work:   Testing/Procedures:  NONE    NONE  Special Instructions CLEARED TO PROCEDURE  Follow-Up: Your next appointment:  KEEP SCHEDULED APPOINTMENT  In Person with Erik Chandler, MD   At [facility], you  and your health needs are our priority.  As part of our continuing mission to provide you with exceptional heart care, we have created designated Provider Care Teams.  These Care Teams  include your primary Cardiologist (physician) and Advanced Practice Providers (APPs -  Physician Assistants and Nurse Practitioners) who all work together to provide you with the care you need, when you need it.  We recommend signing up for the patient portal called "MyChart".  Sign up information is provided on this After Visit Summary.  MyChart is used to connect with patients for Virtual Visits (Telemedicine).  Patients are able to view lab/test results, encounter notes, upcoming appointments, etc.  Non-urgent messages can be sent to your provider as well.   To learn more about what you can do with MyChart, go to [email or web address].       Angelena Form, PA-C  03/10/2020 11:07 AM    [place]

## 2020-03-10 NOTE — Assessment & Plan Note (Signed)
LDL 48 March 2021

## 2020-03-10 NOTE — Assessment & Plan Note (Signed)
NIDDM

## 2020-03-11 NOTE — Telephone Encounter (Signed)
Our office received a duplicate clearance request though asking if the pt is going to need SBE prior to procedure. I will re-address with pre op for further review and have pre op call back re-fax any new notes to requesting office. Original clearance was faxed over yesterday.

## 2020-03-15 NOTE — Telephone Encounter (Signed)
Preop SBE prophylaxis is no longer recommended prior to GI or GU procedures.  Kerin Ransom PA-C 03/15/2020 1:39 PM

## 2020-03-15 NOTE — Telephone Encounter (Signed)
Erik Keith is calling from the requesting office stating they did receive the clearance back, but it did not advise if the patient will be needing antibiotics. She states this is something they give through IV so a prescription is not needed, but they do need a refax of if the patient should have it or not. Please advise.

## 2020-03-15 NOTE — Telephone Encounter (Signed)
Pre op SBE prophylaxis is no longer recommended prior to GI/GU procedures. They can give their usual IV antibiotics but nothing outpatient/oral is required prior to procedure.

## 2020-03-23 ENCOUNTER — Other Ambulatory Visit: Payer: Self-pay | Admitting: Cardiovascular Disease

## 2020-04-25 ENCOUNTER — Other Ambulatory Visit: Payer: Self-pay | Admitting: *Deleted

## 2020-04-25 MED ORDER — AMLODIPINE BESYLATE 10 MG PO TABS: 10.0000 mg | ORAL_TABLET | Freq: Every day | ORAL | 3 refills | Status: DC

## 2020-05-23 ENCOUNTER — Other Ambulatory Visit: Payer: Self-pay

## 2020-05-23 ENCOUNTER — Encounter: Payer: Self-pay | Admitting: Cardiovascular Disease

## 2020-05-23 ENCOUNTER — Ambulatory Visit: Payer: Medicare HMO | Admitting: Cardiovascular Disease

## 2020-05-23 VITALS — BP 118/64 | HR 53 | Ht 71.0 in | Wt 190.2 lb

## 2020-05-23 DIAGNOSIS — I2510 Atherosclerotic heart disease of native coronary artery without angina pectoris: Secondary | ICD-10-CM

## 2020-05-23 DIAGNOSIS — E782 Mixed hyperlipidemia: Secondary | ICD-10-CM | POA: Diagnosis not present

## 2020-05-23 DIAGNOSIS — I480 Paroxysmal atrial fibrillation: Secondary | ICD-10-CM | POA: Diagnosis not present

## 2020-05-23 DIAGNOSIS — Z953 Presence of xenogenic heart valve: Secondary | ICD-10-CM | POA: Diagnosis not present

## 2020-05-23 MED ORDER — ASPIRIN EC 81 MG PO TBEC: 81.0000 mg | DELAYED_RELEASE_TABLET | Freq: Every day | ORAL | 3 refills | Status: DC

## 2020-05-23 NOTE — Patient Instructions (Signed)
Medication Instructions:  Your physician has recommended you make the following change in your medication:  1.) stop clopidogrel (Plavix) 75 mg 2.) start aspirin 81 mg - one tablet once daily  *If you need a refill on your cardiac medications before your next appointment, please call your pharmacy*   Lab Work: none If you have labs (blood work) drawn today and your tests are completely normal, you will receive your results only by: Marland Kitchen MyChart Message (if you have MyChart) OR . A paper copy in the mail If you have any lab test that is abnormal or we need to change your treatment, we will call you to review the results.   Testing/Procedures: none   Follow-Up: At [facility], you and your health needs are our priority.  As part of our continuing mission to provide you with exceptional heart care, we have created designated Provider Care Teams.  These Care Teams include your primary Cardiologist (physician) and Advanced Practice Providers (APPs -  Physician Assistants and Nurse Practitioners) who all work together to provide you with the care you need, when you need it.  We recommend signing up for the patient portal called "MyChart".  Sign up information is provided on this After Visit Summary.  MyChart is used to connect with patients for Virtual Visits (Telemedicine).  Patients are able to view lab/test results, encounter notes, upcoming appointments, etc.  Non-urgent messages can be sent to your provider as well.   To learn more about what you can do with MyChart, go to [email or web address].    Your next appointment:   12 month(s)  The format for your next appointment:   In Person  Provider:   You [place] see Lauree Chandler, MD or one of the following Advanced Practice Providers on your designated Care Team:    Melina Copa, PA-C  Ermalinda Barrios, PA-C

## 2020-05-23 NOTE — Progress Notes (Signed)
Chief Complaint  Patient presents with  . Follow-up    CAD   History of Present Illness:80 yo male with history of severe aortic stenosis s/p AVR, CAD s/p CABG, GERD, HTN, hyperlipidemia, iron deficiency anemia, arthritis, diabetes who is here today for follow up. I saw him as a new patient for further discussion regarding his aortic stenosis and possible TAVR 09/05/17. He had been followed in the past by Dr. Bettina Gavia in [place] but most recently had been seen by Dr.McGukin in [facility] when Dr. Bettina Gavia left that practice. Echo June 2019 at [facility] with normal LV systolic function, BDZH=29-92%, mild LVH, severe aortic stenosis with mean gradient 45 mmHg and AVA 0.61 cm2. There was mild aortic valve insufficiency. He was at [facility] August 2019 to have shoulder surgery and given his severe aortic stenosis his shoulder surgery was cancelled. He also has CAD and has had a stent placed in the LAD in 2008 and a stent placed in the RCA in 2009.  Workup for TAVR was started when I saw him in September 2019. Cardiac cath 09/12/17 with severe three vessel CAD. We discussed CABG and surgical AVR. He was seen by Dr. Roxy Manns with CT surgery 09/20/17 and surgical AVR withi CABG was planned. He then presented to [facility] 10/02/17 with an acute lateral STEMI. This occurred after finding his son dead. His Circumflex was occluded. His Circumflex was treated with balloon angioplasty and emergent 4V CABG and AVR was performed 10/03/17. Beta blocker stopped post op due to bradycardia. He had post-op atrial fibrillation treated with amiodarone. He was in sinus at time of discharge. He had some chest pain in the Spring of 2021. Echo March 2021 with LVEF=60-65%, normally functioning AVR. Imdur and Norvasc added and chest pain resolved. Beta blocker stopped due to bradycardia.   He is here today for follow up. The patient denies any chest pain, dyspnea, palpitations, lower extremity edema, orthopnea, PND, dizziness, near syncope  or syncope.     Primary Care Physician: Raina Mina., MD  Past Medical History:  Diagnosis Date  . Aortic valve stenosis, severe   . CAD S/P percutaneous coronary angioplasty    12-2006  STENT TO LAD;  07-2007 DEStenting TO RCA   . GERD (gastroesophageal reflux disease)   . Hypertension   . Incidental pulmonary nodule, less than or equal to 88mm 10/02/2017   Ground glass opacities RUL seen on CTA  . Iron deficiency anemia   . Mixed hyperlipidemia   . OA (osteoarthritis) of shoulder    LEFT SHOULDER AND AC JOINT  . 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 Type 2 diabetes mellitus ([place])     Past Surgical History:  Procedure Laterality Date  . AORTIC VALVE REPLACEMENT N/A 10/03/2017   Procedure: AORTIC VALVE REPLACEMENT (AVR);  Surgeon: Rexene Alberts, MD;  Location: [place];  Service: Open Heart Surgery;  Laterality: N/A;  . APPENDECTOMY    . BACK SURGERY  x3  . CORONARY ARTERY BYPASS GRAFT N/A 10/03/2017   Procedure: CORONARY ARTERY BYPASS GRAFTING (CABG)x three, USING LEFT INTERNAL MAMMARY ARTERY AND RIGHT GREATER SAPHENOUS VEIN HARVESTED ENDOSCOPICALLY;  Surgeon: Rexene Alberts, MD;  Location: [place];  Service: Open Heart Surgery;  Laterality: N/A;  . CORONARY/GRAFT ACUTE  MI REVASCULARIZATION N/A 10/02/2017   Procedure: Coronary/Graft Acute MI Revascularization;  Surgeon: Jettie Booze, MD;  Location: [place] [facility];  Service: Cardiovascular;  Laterality: N/A;  . LEFT HEART CATH AND CORONARY ANGIOGRAPHY N/A 10/02/2017   Procedure: LEFT HEART CATH AND CORONARY ANGIOGRAPHY;  Surgeon: Jettie Booze, MD;  Location: [place] [facility];  Service: Cardiovascular;  Laterality: N/A;  . RIGHT/LEFT HEART CATH AND CORONARY ANGIOGRAPHY N/A 09/12/2017   Procedure: RIGHT/LEFT HEART CATH  AND CORONARY ANGIOGRAPHY;  Surgeon: Burnell Blanks, MD;  Location: [place] [facility];  Service: Cardiovascular;  Laterality: N/A;  . TONSILLECTOMY      Current Outpatient Medications  Medication Sig Dispense Refill  . acetaminophen (TYLENOL) 500 MG tablet Take 2 tablets (1,000 mg total) by mouth every 6 (six) hours as needed for mild pain or fever. 30 tablet 0  . amLODipine (NORVASC) 10 MG tablet Take 1 tablet (10 mg total) by mouth daily. 90 tablet 3  . aspirin EC 81 MG tablet Take 1 tablet (81 mg total) by mouth daily. Swallow whole. 90 tablet 3  . atorvastatin (LIPITOR) 20 MG tablet TAKE 1 TABLET (20 MG TOTAL) BY MOUTH DAILY AT 6 PM. 90 tablet 2  . Cyanocobalamin (B-12) 1000 MCG TABS Take 1 tablet by mouth daily.    Marland Kitchen glipiZIDE (GLUCOTROL XL) 5 MG 24 hr tablet Take 1 tablet by mouth 2 (two) times daily.    Marland Kitchen glucose blood (ONETOUCH ULTRA) test strip USE TO CHECK BLOOD SUGAR ONCE A DAY E11.9    . hydrochlorothiazide (MICROZIDE) 12.5 MG capsule TAKE 1 CAPSULE BY MOUTH EVERY DAY 90 capsule 3  . HYDROcodone-acetaminophen (NORCO) 10-325 MG tablet Take 1 tablet by mouth 2 (two) times daily.    . isosorbide mononitrate (IMDUR) 30 MG 24 hr tablet TAKE 1 TABLET BY MOUTH EVERY DAY 90 tablet 1  . lisinopril (ZESTRIL) 20 MG tablet TAKE 1 TABLET BY MOUTH EVERY DAY 90 tablet 1  . metFORMIN (GLUCOPHAGE) 500 MG tablet Take 1 tablet by mouth 2 (two) times daily with a meal.    . nitroGLYCERIN (NITROSTAT) 0.4 MG SL tablet Place 1 tablet (0.4 mg total) under the tongue every 5 (five) minutes as needed for chest pain. 25 tablet 3  . pantoprazole (PROTONIX) 40 MG tablet Take 1 tablet by mouth daily.    . Polyethylene Glycol 3350 (MIRALAX PO) Take 17 g by mouth daily. WITH COFFEE    . Wheat Dextrin (BENEFIBER) POWD Benefiber, Mix 1 tablespoon with a liquid and drink once daily.    Marland Kitchen zolpidem (AMBIEN) 10 MG tablet Take 10 mg by mouth at bedtime as needed for sleep.     No current facility-administered  medications for this visit.    Allergies  Allergen Reactions  . Contrast Media [Iodinated Diagnostic Agents] Hives  . Penicillins Hives    Has patient had a PCN reaction causing immediate rash, facial/tongue/throat swelling, SOB or lightheadedness with hypotension: unkn Has patient had a PCN reaction causing severe rash involving mucus membranes or skin necrosis: unkn Has patient had a PCN reaction that required hospitalization: unkn Has patient had a PCN reaction occurring within the last 10 years: no If all of the above answers are "NO", then [place] proceed with Cephalosporin use.     Social History   Socioeconomic History  . Marital status: Married    Spouse name: Lenell Antu  . Number of children: 2  . Years of education: Not on file  . Highest education  level: Not on file  Occupational History  . Occupation: Retired-worked at [employer]  . Smoking status: Former Smoker    Years: 40.00    Quit date: 2002    Years since quitting: 20.4  . Smokeless tobacco: Never Used  Vaping Use  . Vaping Use: Unknown  Substance and Sexual Activity  . Alcohol use: Never  . Drug use: Never  . Sexual activity: Not on file  Other Topics Concern  . Not on file  Social History Narrative  . Not on file   Social Determinants of Health   Financial Resource Strain: Not on file  Food Insecurity: Not on file  Transportation Needs: Not on file  Physical Activity: Not on file  Stress: Not on file  Social Connections: Not on file  Intimate Partner Violence: Not on file    Family History  Problem Relation Age of Onset  . CVA Father   . Heart attack Sister   . Heart attack Brother        3 brothers with CAD    Review of Systems:  As stated in the HPI and otherwise negative.   BP 118/64   Pulse (!) 53   Ht 5\' 11"  (1.803 m)   Wt 190 lb 3.2 oz (86.3 kg)   SpO2 96%   BMI 26.53 kg/m   Physical Examination: General: Well developed, well nourished, NAD  HEENT: OP clear,  mucus membranes moist  SKIN: warm, dry. No rashes. Neuro: No focal deficits  Musculoskeletal: Muscle strength 5/5 all ext  Psychiatric: Mood and affect normal  Neck: No JVD, no carotid bruits, no thyromegaly, no lymphadenopathy.  Lungs:Clear bilaterally, no wheezes, rhonci, crackles Cardiovascular: Regular rate and rhythm. No murmurs, gallops or rubs. Abdomen:Soft. Bowel sounds present. Non-tender.  Extremities: No lower extremity edema. Pulses are 2 + in the bilateral DP/PT.  EKG:  EKG is not ordered today. The ekg ordered today demonstrates    Recent Labs: 02/04/2020: ALT 19; BUN 28; Creatinine 1.4; Hemoglobin 15.0; Platelets 140; Potassium 4.5; Sodium 140   Lipid Panel    Component Value Date/Time   CHOL 115 03/23/2019 1130   TRIG 215 (H) 03/23/2019 1130   HDL 32 (L) 03/23/2019 1130   CHOLHDL 3.6 03/23/2019 1130   LDLCALC 48 03/23/2019 1130     Wt Readings from Last 3 Encounters:  05/23/20 190 lb 3.2 oz (86.3 kg)  03/10/20 193 lb 6.4 oz (87.7 kg)  02/04/20 194 lb 12.8 oz (88.4 kg)     Other studies Reviewed: Additional studies/ records that were reviewed today include: .office records, EKG, echo report Review of the above records demonstrates:    Assessment and Plan:   1. Severe aortic valve stenosis: He is s/p AVR on 10/03/17. He remains on Plavix. Will stop Plavix and start ASA 81 mg daily. SBE prophylaxis when indicated.   2. CAD s/p CABG without angina: He has no chest pain. Change Plavix to ASA. Continue statin, Imdur and Norvasc. Beta blocker stopped due to bradycardia.   3. Post-op atrial fibrillation: sinus today. No known recurrence of atrial fibrillation  4. Hyperlipidemia: LDL at goal in March 2021. Lipids followed in primary care. Continue statin.    Current medicines are reviewed at length with the patient today.  The patient does not have concerns regarding medicines.  The following changes have been made:  no change  Labs/ tests ordered today  include:   No orders of the defined types were placed in this  encounter.    Disposition:   F/U with me in 12 months   Signed, Lauree Chandler, MD 05/23/2020 11:15 AM    [facility] [address], [place], [place]  [address] Phone: [phone]; Fax: [phone]

## 2020-06-27 ENCOUNTER — Other Ambulatory Visit: Payer: Self-pay | Admitting: *Deleted

## 2020-06-27 MED ORDER — LISINOPRIL 20 MG PO TABS: 1.0000 | ORAL_TABLET | Freq: Every day | ORAL | 3 refills | Status: DC

## 2020-06-27 MED ORDER — AMLODIPINE BESYLATE 10 MG PO TABS: 10.0000 mg | ORAL_TABLET | Freq: Every day | ORAL | 3 refills | Status: DC

## 2020-07-22 ENCOUNTER — Other Ambulatory Visit: Payer: Self-pay | Admitting: Cardiovascular Disease

## 2020-07-22 ENCOUNTER — Other Ambulatory Visit: Payer: Self-pay

## 2020-07-22 MED ORDER — ISOSORBIDE MONONITRATE ER 30 MG PO TB24: 30.0000 mg | ORAL_TABLET | Freq: Every day | ORAL | 3 refills | Status: DC

## 2020-09-26 ENCOUNTER — Other Ambulatory Visit: Payer: Self-pay

## 2020-09-26 MED ORDER — AMLODIPINE BESYLATE 10 MG PO TABS: 10.0000 mg | ORAL_TABLET | Freq: Every day | ORAL | 2 refills | Status: DC

## 2020-09-28 ENCOUNTER — Other Ambulatory Visit: Payer: Self-pay

## 2020-09-28 MED ORDER — AMLODIPINE BESYLATE 10 MG PO TABS: 10.0000 mg | ORAL_TABLET | Freq: Every day | ORAL | 2 refills | Status: DC

## 2021-01-11 DIAGNOSIS — Z0000 Encounter for general adult medical examination without abnormal findings: Secondary | ICD-10-CM | POA: Diagnosis not present

## 2021-01-27 ENCOUNTER — Other Ambulatory Visit: Payer: Self-pay | Admitting: Hematology and Oncology

## 2021-01-27 ENCOUNTER — Inpatient Hospital Stay: Payer: Medicare HMO | Attending: Oncology

## 2021-01-27 ENCOUNTER — Other Ambulatory Visit: Payer: Self-pay

## 2021-01-27 DIAGNOSIS — D472 Monoclonal gammopathy: Secondary | ICD-10-CM | POA: Insufficient documentation

## 2021-01-27 LAB — BASIC METABOLIC PANEL
BUN: 23 — AB (ref 4–21)
CO2: 27 — AB (ref 13–22)
Chloride: 102 (ref 99–108)
Creatinine: 1.6 — AB (ref 0.6–1.3)
Glucose: 144
Potassium: 4.5 (ref 3.4–5.3)
Sodium: 138 (ref 137–147)

## 2021-01-27 LAB — HEPATIC FUNCTION PANEL
ALT: 22 (ref 10–40)
AST: 22 (ref 14–40)
Alkaline Phosphatase: 74 (ref 25–125)
Bilirubin, Total: 0.6

## 2021-01-27 LAB — CBC AND DIFFERENTIAL
HCT: 49 (ref 41–53)
Hemoglobin: 16.5 (ref 13.5–17.5)
Neutrophils Absolute: 6
Platelets: 128 — AB (ref 150–399)
WBC: 8.7

## 2021-01-27 LAB — CBC: RBC: 5.17 — AB (ref 3.87–5.11)

## 2021-01-27 LAB — COMPREHENSIVE METABOLIC PANEL
Albumin: 4.6 (ref 3.5–5.0)
Calcium: 9.9 (ref 8.7–10.7)

## 2021-01-30 LAB — PROTEIN ELECTROPHORESIS, SERUM
A/G Ratio: 1.1 (ref 0.7–1.7)
Albumin ELP: 3.8 g/dL (ref 2.9–4.4)
Alpha-1-Globulin: 0.2 g/dL (ref 0.0–0.4)
Alpha-2-Globulin: 0.8 g/dL (ref 0.4–1.0)
Beta Globulin: 1 g/dL (ref 0.7–1.3)
Gamma Globulin: 1.3 g/dL (ref 0.4–1.8)
Globulin, Total: 3.4 g/dL (ref 2.2–3.9)
M-Spike, %: 0.9 g/dL — ABNORMAL HIGH
Total Protein ELP: 7.2 g/dL (ref 6.0–8.5)

## 2021-01-30 NOTE — Progress Notes (Signed)
[facility]  [address] [place],  [place]  [id number] [phone]  Clinic Day:  02/03/2021  Referring physician: Raina Mina., MD  This document serves as a record of services personally performed by Jakell Trusty Macarthur Critchley, MD. It was created on their behalf by [facility] E, a trained medical scribe. The creation of this record is based on the scribe's personal observations and the provider's statements to them.  HISTORY OF PRESENT ILLNESS:  The patient is a 81 y.o. 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  Since his last visit, the patient has been doing very well.  He continues to deny having fatigue, bone pain, or other symptoms which concern him for his MGUS potentially progressing into multiple myeloma.  PHYSICAL EXAM:  Blood pressure 139/63, pulse 64, temperature (!) 97.5 F (36.4 C), resp. rate 16, height '5\' 11"'  (1.803 m), weight 188 lb (85.3 kg), SpO2 93 %. Wt Readings from Last 3 Encounters:  02/03/21 188 lb (85.3 kg)  05/23/20 190 lb 3.2 oz (86.3 kg)  03/10/20 193 lb 6.4 oz (87.7 kg)   Body mass index is 26.22 kg/m. Performance status (ECOG): 1 Physical Exam Constitutional:      Appearance: Normal appearance. He is not ill-appearing.  HENT:     Mouth/Throat:     Mouth: Mucous membranes are moist.     Pharynx: Oropharynx is clear. No oropharyngeal exudate or posterior oropharyngeal erythema.  Cardiovascular:     Rate and Rhythm: Normal rate and regular rhythm.     Heart sounds: No murmur heard.   No friction rub. No gallop.  Pulmonary:     Effort: Pulmonary effort is normal. No respiratory distress.     Breath sounds: Normal breath sounds. No wheezing, rhonchi or rales.  Abdominal:     General: Bowel sounds are normal. There is no distension.     Palpations: Abdomen is soft. There is no mass.     Tenderness: There is no abdominal tenderness.   Musculoskeletal:        General: No swelling.     Right lower leg: No edema.     Left lower leg: No edema.  Lymphadenopathy:     Cervical: No cervical adenopathy.     Upper Body:     Right upper body: No supraclavicular or axillary adenopathy.     Left upper body: No supraclavicular or axillary adenopathy.     Lower Body: No right inguinal adenopathy. No left inguinal adenopathy.  Skin:    General: Skin is warm.     Coloration: Skin is not jaundiced.     Findings: No lesion or rash.  Neurological:     General: No focal deficit present.     Mental Status: He is alert and oriented to person, place, and time. Mental status is at baseline.  Psychiatric:        Mood and Affect: Mood normal.        Behavior: Behavior normal.        Thought Content: Thought content normal.    LABS:   CBC Latest Ref Rng & Units 01/27/2021 02/04/2020 03/23/2019  WBC - 8.7 9.2 7.8  Hemoglobin 13.5 - 17.5 16.5 15.0 14.6  Hematocrit 41 - 53 49 44 43.6  Platelets 150 - 399 128(A) 140(A) 173   CMP Latest Ref Rng & Units 01/27/2021 02/04/2020 04/08/2019  Glucose 65 - 99 mg/dL - -  189(H)  BUN 4 - 21 23(A) 28(A) 26  Creatinine 0.6 - 1.3 1.6(A) 1.4(A) 1.28(H)  Sodium 137 - 147 138 140 143  Potassium 3.4 - 5.3 4.5 4.5 4.4  Chloride 99 - 108 102 105 103  CO2 13 - 22 27(A) 27(A) 23  Calcium 8.7 - 10.7 9.9 9.4 10.3(H)  Total Protein 6.0 - 8.5 g/dL - - -  Total Bilirubin 0.0 - 1.2 mg/dL - - -  Alkaline Phos 25 - 125 74 67 -  AST 14 - 40 22 24 -  ALT 10 - 40 22 19 -    Latest Reference Range & Units 02/04/20 10:22 01/27/21 09:59  Total Protein ELP 6.0 - 8.5 g/dL 7.3 7.2  Albumin ELP 2.9 - 4.4 g/dL 4.1 3.8  Globulin, Total 2.2 - 3.9 g/dL 3.2 (C) 3.4 (C)  A/G Ratio 0.7 - 1.7  1.3 (C) 1.1 (C)  Alpha-1-Globulin 0.0 - 0.4 g/dL 0.2 0.2  Alpha-2-Globulin 0.4 - 1.0 g/dL 0.7 0.8  Beta Globulin 0.7 - 1.3 g/dL 1.0 1.0  Gamma Globulin 0.4 - 1.8 g/dL 1.4 1.3  M-SPIKE, % Not Observed g/dL 1.0 (H) 0.9 (H)  SPE Interp.   Comment Comment  Comment  Comment Comment     ASSESSMENT & PLAN:  Assessment/Plan:  A 81 y.o. 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  In clinic today, I went over all of his recent labs with him, which continue to show no signs of his MGUS progressing into a more aggressive plasma cell dyscrasia, such as multiple myeloma.  Clinically, he is doing very well.  As that is the case, I will see him back in 1 year to reassess his MGUS. The patient understands all the plans discussed today and is in agreement with them.     I, Rita Ohara, am acting as scribe for Marice Potter, MD    I have reviewed this report as typed by the medical scribe, and it is complete and accurate.  Shakyra Mattera Macarthur Critchley, MD

## 2021-02-03 ENCOUNTER — Inpatient Hospital Stay: Payer: Medicare HMO | Attending: Oncology | Admitting: Oncology

## 2021-02-03 VITALS — BP 139/63 | HR 64 | Temp 97.5°F | Resp 16 | Ht 71.0 in | Wt 188.0 lb

## 2021-02-03 DIAGNOSIS — D472 Monoclonal gammopathy: Secondary | ICD-10-CM | POA: Diagnosis not present

## 2021-02-23 DIAGNOSIS — B9689 Other specified bacterial agents as the cause of diseases classified elsewhere: Secondary | ICD-10-CM | POA: Diagnosis not present

## 2021-02-23 DIAGNOSIS — J019 Acute sinusitis, unspecified: Secondary | ICD-10-CM | POA: Diagnosis not present

## 2021-03-05 ENCOUNTER — Other Ambulatory Visit: Payer: Self-pay | Admitting: Cardiovascular Disease

## 2021-03-18 ENCOUNTER — Other Ambulatory Visit: Payer: Self-pay | Admitting: Cardiovascular Disease

## 2021-03-20 DIAGNOSIS — M533 Sacrococcygeal disorders, not elsewhere classified: Secondary | ICD-10-CM | POA: Diagnosis not present

## 2021-03-20 DIAGNOSIS — Z79891 Long term (current) use of opiate analgesic: Secondary | ICD-10-CM | POA: Diagnosis not present

## 2021-03-20 DIAGNOSIS — Z79899 Other long term (current) drug therapy: Secondary | ICD-10-CM | POA: Diagnosis not present

## 2021-03-20 DIAGNOSIS — M47816 Spondylosis without myelopathy or radiculopathy, lumbar region: Secondary | ICD-10-CM | POA: Diagnosis not present

## 2021-03-20 DIAGNOSIS — M545 Low back pain, unspecified: Secondary | ICD-10-CM | POA: Diagnosis not present

## 2021-03-20 DIAGNOSIS — G8929 Other chronic pain: Secondary | ICD-10-CM | POA: Diagnosis not present

## 2021-03-20 DIAGNOSIS — M159 Polyosteoarthritis, unspecified: Secondary | ICD-10-CM | POA: Diagnosis not present

## 2021-04-12 DIAGNOSIS — I2510 Atherosclerotic heart disease of native coronary artery without angina pectoris: Secondary | ICD-10-CM | POA: Diagnosis not present

## 2021-04-12 DIAGNOSIS — Z139 Encounter for screening, unspecified: Secondary | ICD-10-CM | POA: Diagnosis not present

## 2021-04-12 DIAGNOSIS — E785 Hyperlipidemia, unspecified: Secondary | ICD-10-CM | POA: Diagnosis not present

## 2021-04-12 DIAGNOSIS — D509 Iron deficiency anemia, unspecified: Secondary | ICD-10-CM | POA: Diagnosis not present

## 2021-04-12 DIAGNOSIS — E1169 Type 2 diabetes mellitus with other specified complication: Secondary | ICD-10-CM | POA: Diagnosis not present

## 2021-04-12 DIAGNOSIS — R5383 Other fatigue: Secondary | ICD-10-CM | POA: Diagnosis not present

## 2021-04-22 DIAGNOSIS — Z79899 Other long term (current) drug therapy: Secondary | ICD-10-CM | POA: Diagnosis not present

## 2021-04-22 DIAGNOSIS — S30811A Abrasion of abdominal wall, initial encounter: Secondary | ICD-10-CM | POA: Diagnosis not present

## 2021-04-22 DIAGNOSIS — Y92009 Unspecified place in unspecified non-institutional (private) residence as the place of occurrence of the external cause: Secondary | ICD-10-CM | POA: Diagnosis not present

## 2021-04-22 DIAGNOSIS — Z880 Allergy status to penicillin: Secondary | ICD-10-CM | POA: Diagnosis not present

## 2021-04-22 DIAGNOSIS — Z7984 Long term (current) use of oral hypoglycemic drugs: Secondary | ICD-10-CM | POA: Diagnosis not present

## 2021-04-22 DIAGNOSIS — S0093XA Contusion of unspecified part of head, initial encounter: Secondary | ICD-10-CM | POA: Diagnosis not present

## 2021-04-22 DIAGNOSIS — S51012A Laceration without foreign body of left elbow, initial encounter: Secondary | ICD-10-CM | POA: Diagnosis not present

## 2021-04-22 DIAGNOSIS — W19XXXA Unspecified fall, initial encounter: Secondary | ICD-10-CM | POA: Diagnosis not present

## 2021-04-22 DIAGNOSIS — S50812A Abrasion of left forearm, initial encounter: Secondary | ICD-10-CM | POA: Diagnosis not present

## 2021-04-22 DIAGNOSIS — S0990XA Unspecified injury of head, initial encounter: Secondary | ICD-10-CM | POA: Diagnosis not present

## 2021-04-22 DIAGNOSIS — Z7982 Long term (current) use of aspirin: Secondary | ICD-10-CM | POA: Diagnosis not present

## 2021-05-02 DIAGNOSIS — S301XXA Contusion of abdominal wall, initial encounter: Secondary | ICD-10-CM | POA: Diagnosis not present

## 2021-05-02 DIAGNOSIS — S50812A Abrasion of left forearm, initial encounter: Secondary | ICD-10-CM | POA: Diagnosis not present

## 2021-05-02 DIAGNOSIS — S0990XA Unspecified injury of head, initial encounter: Secondary | ICD-10-CM | POA: Diagnosis not present

## 2021-05-22 NOTE — Progress Notes (Unsigned)
Cardiology Office Note:    Date:  05/24/2021   ID:  Baldo Ash [place], DOB March 06, 1940, MRN [id number]  PCP:  Nicoletta Dress, MD  [facility] Cardiologist:  Lauree Chandler, MD  [facility] Electrophysiologist:  None   Chief Complaint: yearly follow up   History of Present Illness:    Erik Keith is a 81 y.o. male with a hx of severe aortic stenosis s/p AVR, CAD s/p CABG, GERD, HTN, hyperlipidemia, iron deficiency anemia, arthritis, diabetes and GERD presents for follow up.   Previously followed by Dr Bettina Gavia and then Dr. Beatrix Fetters in [place].   Hx of CAD and has had a stent placed in the LAD in 2008 and a stent placed in the RCA in 2009  Cardiac cath 09/12/17 with severe three vessel CAD. We discussed CABG and surgical AVR. He was seen by Dr. Roxy Manns with CT surgery 09/20/17 and surgical AVR withi CABG was planned. He then presented to [facility] 10/02/17 with an acute lateral STEMI. This occurred after finding his son dead. His Circumflex was occluded. His Circumflex was treated with balloon angioplasty and emergent 4V CABG and AVR was performed 10/03/17. Beta blocker stopped post op due to bradycardia. He had post-op atrial fibrillation treated with amiodarone. He was in sinus at time of discharge. He had some chest pain in the Spring of 2021. Echo March 2021 with LVEF=60-65%, normally functioning AVR. Imdur and Norvasc added and chest pain resolved. Beta blocker stopped due to bradycardia.   He was doing well when last seen by Dr. Angelena Form 05/2020.  Here today for follow up.  Recently had mechanical fall leading to multiple episodes on arms and abdomen.  He does yard work and [employer] without chest pain or shortness of breath.  Denies palpitation, orthopnea, PND, syncope, lower extremity edema or melena.   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   LIMA to LAD, Sequential SVG to OM1-OM2, SVG to PDA, EVH via right thigh   Type 2 diabetes mellitus ([facility]     Past Surgical History:  Procedure Laterality Date   AORTIC VALVE REPLACEMENT N/A 10/03/2017   Procedure: AORTIC VALVE REPLACEMENT (AVR);  Surgeon: ORexene Alberts MD;  Location: [facility]  Service: Open Heart Surgery;  Laterality: N/A;   APPENDECTOMY     BACK SURGERY  x3   CORONARY ARTERY BYPASS GRAFT N/A 10/03/2017   Procedure: CORONARY ARTERY BYPASS GRAFTING (CABG)x three, USING LEFT INTERNAL MAMMARY ARTERY AND RIGHT GREATER SAPHENOUS VEIN HARVESTED ENDOSCOPICALLY;  Surgeon: ORexene Alberts MD;  [facility]  Service: Open Heart Surgery;  Laterality: N/A;   CORONARY/GRAFT ACUTE MI REVASCULARIZATION N/A 10/02/2017   Procedure: Coronary/Graft Acute MI Revascularization;  Surgeon: VJettie Booze MD;  [facility];  Service: Cardiovascular;  Laterality: N/A;   LEFT HEART CATH AND CORONARY ANGIOGRAPHY  N/A 10/02/2017   Procedure: LEFT HEART CATH AND CORONARY ANGIOGRAPHY;  Surgeon: Jettie Booze, MD;  Location: [place] [facility];  Service: Cardiovascular;  Laterality: N/A;   RIGHT/LEFT HEART CATH AND CORONARY ANGIOGRAPHY N/A 09/12/2017   Procedure: RIGHT/LEFT HEART CATH AND CORONARY ANGIOGRAPHY;  Surgeon: Burnell Blanks, MD;  Location: [place] [facility];  Service: Cardiovascular;  Laterality: N/A;   TONSILLECTOMY      Current Medications: Current Meds  Medication Sig   acetaminophen (TYLENOL) 500 MG tablet Take 2  tablets (1,000 mg total) by mouth every 6 (six) hours as needed for mild pain or fever.   aspirin EC 81 MG tablet Take 1 tablet (81 mg total) by mouth daily. Swallow whole.   atorvastatin (LIPITOR) 40 MG tablet Take by mouth.   Cyanocobalamin (B-12) 1000 MCG TABS Take 1 tablet by mouth daily.   glipiZIDE (GLUCOTROL XL) 5 MG 24 hr tablet Take 1 tablet by mouth daily with breakfast.   glucose blood (ONETOUCH ULTRA) test strip USE TO CHECK BLOOD SUGAR ONCE A DAY E11.9   HYDROcodone-acetaminophen (NORCO) 10-325 MG tablet Take 1 tablet by mouth 2 (two) times daily.   metFORMIN (GLUCOPHAGE) 500 MG tablet Take 1 tablet by mouth 2 (two) times daily with a meal.   nitroGLYCERIN (NITROSTAT) 0.4 MG SL tablet Place 1 tablet (0.4 mg total) under the tongue every 5 (five) minutes as needed for chest pain.   pantoprazole (PROTONIX) 40 MG tablet Take 1 tablet by mouth daily.   Polyethylene Glycol 3350 (MIRALAX PO) Take 17 g by mouth daily. WITH COFFEE   Wheat Dextrin (BENEFIBER) POWD Benefiber, Mix 1 tablespoon with a liquid and drink once daily.   zolpidem (AMBIEN) 10 MG tablet Take 10 mg by mouth at bedtime as needed for sleep.   [DISCONTINUED] amLODipine (NORVASC) 10 MG tablet Take 1 tablet (10 mg total) by mouth daily.   [DISCONTINUED] hydrochlorothiazide (MICROZIDE) 12.5 MG capsule Take 1 capsule (12.5 mg total) by mouth daily. Please schedule yearly appointment for Erik Keith 2023 for future refills. Thank you   [DISCONTINUED] isosorbide mononitrate (IMDUR) 30 MG 24 hr tablet Take 1 tablet (30 mg total) by mouth daily.   [DISCONTINUED] lisinopril (ZESTRIL) 20 MG tablet Take 1 tablet (20 mg total) by mouth daily.     Allergies:   Contrast media [iodinated contrast media] and Penicillins   Social History   Socioeconomic History   Marital status: Married    Spouse name: Lenell Antu   Number of children: 2   Years of education: Not on file   Highest education level: Not on file  Occupational History   Occupation:  Retired-worked at [employer]   Smoking status: Former    Years: 40.00    Types: Cigarettes    Quit date: 2002    Years since quitting: 21.4   Smokeless tobacco: Never  Vaping Use   Vaping Use: Unknown  Substance and Sexual Activity   Alcohol use: Never   Drug use: Never   Sexual activity: Not on file  Other Topics Concern   Not on file  Social History Narrative   Not on file   Social Determinants of Health   Financial Resource Strain: Not on file  Food Insecurity: Not on file  Transportation Needs: Not on file  Physical Activity: Not on file  Stress: Not on file  Social Connections: Not on file     Family History: The patient's family history includes CVA in his father; Heart  attack in his brother and sister.    ROS:   Please see the history of present illness.    All other systems reviewed and are negative.   EKGs/Labs/Other Studies Reviewed:    The following studies were reviewed today:  Echo 03/2019 1. Left ventricular ejection fraction, by estimation, is 60 to 65%. The  left ventricle has normal function. The left ventricle has no regional  wall motion abnormalities. There is mild concentric left ventricular  hypertrophy. Left ventricular diastolic  parameters are indeterminate.   2. Right ventricular systolic function is normal. The right ventricular  size is normal.   3. The mitral valve is normal in structure. Mild mitral valve  regurgitation. No evidence of mitral stenosis.   4. The aortic valve gradient is likely normal for this prosthetic AV and  is unchanged from his previous echo from Feb. 2020.      Marland Kitchen The aortic valve has been repaired/replaced. Aortic valve  regurgitation is not visualized. There is a 23 mm Edwards Inspiris Resilia  bovine valve present in the aortic position. Procedure Date: 10/03/17.   Comparison(s): 02/25/18 EF 60-65%. AV 10mHg mean, 250mg peak.   EKG:  EKG is ordered today.  The ekg ordered today  demonstrates normal sinus rhythm with chronic inferior T wave changes  Recent Labs: 01/27/2021: ALT 22; BUN 23; Creatinine 1.6; Hemoglobin 16.5; Platelets 128; Potassium 4.5; Sodium 138  Recent Lipid Panel    Component Value Date/Time   CHOL 115 03/23/2019 1130   TRIG 215 (H) 03/23/2019 1130   HDL 32 (L) 03/23/2019 1130   CHOLHDL 3.6 03/23/2019 1130   LDLCALC 48 03/23/2019 1130     Physical Exam:    VS:  BP 128/76   Pulse 60   Ht '5\' 11"'$  (1.803 m)   Wt 192 lb (87.1 kg)   SpO2 96%   BMI 26.78 kg/m     Wt Readings from Last 3 Encounters:  05/24/21 192 lb (87.1 kg)  02/03/21 188 lb (85.3 kg)  05/23/20 190 lb 3.2 oz (86.3 kg)     GEN:  Well nourished, well developed in no acute distress HEENT: Normal NECK: No JVD; No carotid bruits LYMPHATICS: No lymphadenopathy CARDIAC: RRR, +murmurs, rubs, gallops RESPIRATORY:  Clear to auscultation without rales, wheezing or rhonchi  ABDOMEN: Soft, non-tender, non-distended MUSCULOSKELETAL:  No edema; No deformity  SKIN: Warm and dry NEUROLOGIC:  Alert and oriented x 3 PSYCHIATRIC:  Normal affect   ASSESSMENT AND PLAN:    CAD s/p prior stenting and CABG 10/2017 BB stopped due to bradycardia in the past.  No anginal symptoms.  Continue aspirin, statin, Imdur.  2. Aortic stenosis s/p AVR 10/2017 -We will update echocardiogram  3. Hx of post op atrial fibrillation - No reoccurrence  4. HTN -Blood pressure stable and controlled on current medications.  5. HLD Continue statin.  Followed by PCP.  Medication Adjustments/Labs and Tests Ordered: Current medicines are reviewed at length with the patient today.  Concerns regarding medicines are outlined above.  Orders Placed This Encounter  Procedures   EKG 12-Lead   ECHOCARDIOGRAM COMPLETE   Meds ordered this encounter  Medications   lisinopril (ZESTRIL) 20 MG tablet    Sig: Take 1 tablet (20 mg total) by mouth daily.    Dispense:  90 tablet    Refill:  3   isosorbide  mononitrate (IMDUR) 30 MG 24 hr tablet    Sig: Take 1 tablet (30 mg total) by mouth daily.  Dispense:  90 tablet    Refill:  3   hydrochlorothiazide (MICROZIDE) 12.5 MG capsule    Sig: Take 1 capsule (12.5 mg total) by mouth daily. Please schedule yearly appointment for [place] 2023 for future refills. Thank you    Dispense:  90 capsule    Refill:  3   amLODipine (NORVASC) 10 MG tablet    Sig: Take 1 tablet (10 mg total) by mouth daily.    Dispense:  90 tablet    Refill:  3    Patient Instructions  Medication Instructions:  Your physician recommends that you continue on your current medications as directed. Please refer to the Current Medication list given to you today.  *If you need a refill on your cardiac medications before your next appointment, please call your pharmacy*   Lab Work: NONE If you have labs (blood work) drawn today and your tests are completely normal, you will receive your results only by: [place] (if you have MyChart) OR A paper copy in the mail If you have any lab test that is abnormal or we need to change your treatment, we will call you to review the results.   Testing/Procedures: Your physician has requested that you have an echocardiogram. Echocardiography is a painless test that uses sound waves to create images of your heart. It provides your doctor with information about the size and shape of your heart and how well your heart's chambers and valves are working. This procedure takes approximately one hour. There are no restrictions for this procedure.   Follow-Up: At [facility], you and your health needs are our priority.  As part of our continuing mission to provide you with exceptional heart care, we have created designated Provider Care Teams.  These Care Teams include your primary Cardiologist (physician) and Advanced Practice Providers (APPs -  Physician Assistants and Nurse Practitioners) who all work together to provide you with the care  you need, when you need it.  We recommend signing up for the patient portal called "MyChart".  Sign up information is provided on this After Visit Summary.  MyChart is used to connect with patients for Virtual Visits (Telemedicine).  Patients are able to view lab/test results, encounter notes, upcoming appointments, etc.  Non-urgent messages can be sent to your provider as well.   To learn more about what you can do with MyChart, go to [email or web address].    Your next appointment:   1 year(s)  The format for your next appointment:   In Person  Provider:   Lauree Chandler, MD       Important Information About Sugar         Jarrett Soho, [place]  05/24/2021 2:52 PM    [place]

## 2021-05-24 ENCOUNTER — Ambulatory Visit: Payer: Medicare HMO | Admitting: Physician Assistant

## 2021-05-24 ENCOUNTER — Encounter: Payer: Self-pay | Admitting: Physician Assistant

## 2021-05-24 VITALS — BP 128/76 | HR 60 | Ht 71.0 in | Wt 192.0 lb

## 2021-05-24 DIAGNOSIS — E119 Type 2 diabetes mellitus without complications: Secondary | ICD-10-CM | POA: Diagnosis not present

## 2021-05-24 DIAGNOSIS — I2510 Atherosclerotic heart disease of native coronary artery without angina pectoris: Secondary | ICD-10-CM

## 2021-05-24 DIAGNOSIS — Z953 Presence of xenogenic heart valve: Secondary | ICD-10-CM | POA: Diagnosis not present

## 2021-05-24 DIAGNOSIS — I10 Essential (primary) hypertension: Secondary | ICD-10-CM | POA: Diagnosis not present

## 2021-05-24 DIAGNOSIS — E782 Mixed hyperlipidemia: Secondary | ICD-10-CM

## 2021-05-24 MED ORDER — LISINOPRIL 20 MG PO TABS: 20.0000 mg | ORAL_TABLET | Freq: Every day | ORAL | 3 refills | Status: DC

## 2021-05-24 MED ORDER — HYDROCHLOROTHIAZIDE 12.5 MG PO CAPS: 12.5000 mg | ORAL_CAPSULE | Freq: Every day | ORAL | 3 refills | Status: DC

## 2021-05-24 MED ORDER — ISOSORBIDE MONONITRATE ER 30 MG PO TB24: 30.0000 mg | ORAL_TABLET | Freq: Every day | ORAL | 3 refills | Status: DC

## 2021-05-24 MED ORDER — AMLODIPINE BESYLATE 10 MG PO TABS: 10.0000 mg | ORAL_TABLET | Freq: Every day | ORAL | 3 refills | Status: DC

## 2021-05-24 NOTE — Patient Instructions (Signed)
Medication Instructions:  Your physician recommends that you continue on your current medications as directed. Please refer to the Current Medication list given to you today.  *If you need a refill on your cardiac medications before your next appointment, please call your pharmacy*   Lab Work: NONE If you have labs (blood work) drawn today and your tests are completely normal, you will receive your results only by: MyChart Message (if you have MyChart) OR A paper copy in the mail If you have any lab test that is abnormal or we need to change your treatment, we will call you to review the results.   Testing/Procedures: Your physician has requested that you have an echocardiogram. Echocardiography is a painless test that uses sound waves to create images of your heart. It provides your doctor with information about the size and shape of your heart and how well your heart's chambers and valves are working. This procedure takes approximately one hour. There are no restrictions for this procedure.    Follow-Up: At [facility], you and your health needs are our priority.  As part of our continuing mission to provide you with exceptional heart care, we have created designated Provider Care Teams.  These Care Teams include your primary Cardiologist (physician) and Advanced Practice Providers (APPs -  Physician Assistants and Nurse Practitioners) who all work together to provide you with the care you need, when you need it.  We recommend signing up for the patient portal called "MyChart".  Sign up information is provided on this After Visit Summary.  MyChart is used to connect with patients for Virtual Visits (Telemedicine).  Patients are able to view lab/test results, encounter notes, upcoming appointments, etc.  Non-urgent messages can be sent to your provider as well.   To learn more about what you can do with MyChart, go to [email or web address].    Your next appointment:   1 year(s)  The  format for your next appointment:   In Person  Provider:   Christopher McAlhany, MD{   Important Information About Sugar       

## 2021-06-13 DIAGNOSIS — Z79891 Long term (current) use of opiate analgesic: Secondary | ICD-10-CM | POA: Diagnosis not present

## 2021-06-13 DIAGNOSIS — Z91041 Radiographic dye allergy status: Secondary | ICD-10-CM | POA: Diagnosis not present

## 2021-06-13 DIAGNOSIS — G8929 Other chronic pain: Secondary | ICD-10-CM | POA: Diagnosis not present

## 2021-06-13 DIAGNOSIS — M47816 Spondylosis without myelopathy or radiculopathy, lumbar region: Secondary | ICD-10-CM | POA: Diagnosis not present

## 2021-06-13 DIAGNOSIS — M159 Polyosteoarthritis, unspecified: Secondary | ICD-10-CM | POA: Diagnosis not present

## 2021-06-13 DIAGNOSIS — Z79899 Other long term (current) drug therapy: Secondary | ICD-10-CM | POA: Diagnosis not present

## 2021-06-13 DIAGNOSIS — M545 Low back pain, unspecified: Secondary | ICD-10-CM | POA: Diagnosis not present

## 2021-06-13 DIAGNOSIS — Z888 Allergy status to other drugs, medicaments and biological substances status: Secondary | ICD-10-CM | POA: Diagnosis not present

## 2021-06-13 DIAGNOSIS — Z880 Allergy status to penicillin: Secondary | ICD-10-CM | POA: Diagnosis not present

## 2021-06-19 ENCOUNTER — Ambulatory Visit (HOSPITAL_COMMUNITY): Payer: Medicare HMO | Attending: Internal Medicine

## 2021-06-19 DIAGNOSIS — Z953 Presence of xenogenic heart valve: Secondary | ICD-10-CM | POA: Diagnosis not present

## 2021-06-19 DIAGNOSIS — I2510 Atherosclerotic heart disease of native coronary artery without angina pectoris: Secondary | ICD-10-CM | POA: Insufficient documentation

## 2021-06-19 LAB — ECHOCARDIOGRAM COMPLETE
AR max vel: 2.85 cm2
AV Area VTI: 2.91 cm2
AV Area mean vel: 2.77 cm2
AV Mean grad: 8.4 mmHg
AV Peak grad: 15.5 mmHg
Ao pk vel: 1.97 m/s
Area-P 1/2: 3.45 cm2
S' Lateral: 3.5 cm

## 2021-07-18 DIAGNOSIS — F5104 Psychophysiologic insomnia: Secondary | ICD-10-CM | POA: Diagnosis not present

## 2021-07-18 DIAGNOSIS — D509 Iron deficiency anemia, unspecified: Secondary | ICD-10-CM | POA: Diagnosis not present

## 2021-07-18 DIAGNOSIS — I2510 Atherosclerotic heart disease of native coronary artery without angina pectoris: Secondary | ICD-10-CM | POA: Diagnosis not present

## 2021-07-18 DIAGNOSIS — I10 Essential (primary) hypertension: Secondary | ICD-10-CM | POA: Diagnosis not present

## 2021-07-18 DIAGNOSIS — E785 Hyperlipidemia, unspecified: Secondary | ICD-10-CM | POA: Diagnosis not present

## 2021-07-18 DIAGNOSIS — E1169 Type 2 diabetes mellitus with other specified complication: Secondary | ICD-10-CM | POA: Diagnosis not present

## 2021-07-20 DIAGNOSIS — Z9181 History of falling: Secondary | ICD-10-CM | POA: Diagnosis not present

## 2021-07-20 DIAGNOSIS — E785 Hyperlipidemia, unspecified: Secondary | ICD-10-CM | POA: Diagnosis not present

## 2021-07-20 DIAGNOSIS — Z1331 Encounter for screening for depression: Secondary | ICD-10-CM | POA: Diagnosis not present

## 2021-07-20 DIAGNOSIS — Z139 Encounter for screening, unspecified: Secondary | ICD-10-CM | POA: Diagnosis not present

## 2021-07-20 DIAGNOSIS — Z0000 Encounter for general adult medical examination without abnormal findings: Secondary | ICD-10-CM | POA: Diagnosis not present

## 2021-08-04 ENCOUNTER — Other Ambulatory Visit: Payer: Self-pay | Admitting: Cardiovascular Disease

## 2021-09-08 DIAGNOSIS — M48062 Spinal stenosis, lumbar region with neurogenic claudication: Secondary | ICD-10-CM | POA: Diagnosis not present

## 2021-09-08 DIAGNOSIS — M5451 Vertebrogenic low back pain: Secondary | ICD-10-CM | POA: Diagnosis not present

## 2021-09-08 DIAGNOSIS — M4180 Other forms of scoliosis, site unspecified: Secondary | ICD-10-CM | POA: Diagnosis not present

## 2021-09-08 DIAGNOSIS — M5459 Other low back pain: Secondary | ICD-10-CM | POA: Diagnosis not present

## 2021-09-11 ENCOUNTER — Telehealth: Payer: Self-pay | Admitting: Cardiovascular Disease

## 2021-09-11 NOTE — Telephone Encounter (Signed)
Per Dr. Angelena Form, MRI is ok post bioprosthetic valve replacement. Informed patient of Dr. Angelena Form advisement. Patient verbalized understanding.

## 2021-09-11 NOTE — Telephone Encounter (Signed)
Will forward to Dr Mcalhany for review ./cy 

## 2021-09-11 NOTE — Telephone Encounter (Signed)
Patient had a heart valve replaced about 4 years ago.  He is going to have a MRI done this coming Saturday. He wants to make sure it's okay to have the MRI done. Please advise.

## 2021-09-14 DIAGNOSIS — M47816 Spondylosis without myelopathy or radiculopathy, lumbar region: Secondary | ICD-10-CM | POA: Diagnosis not present

## 2021-09-14 DIAGNOSIS — Z79891 Long term (current) use of opiate analgesic: Secondary | ICD-10-CM | POA: Diagnosis not present

## 2021-09-14 DIAGNOSIS — M545 Low back pain, unspecified: Secondary | ICD-10-CM | POA: Diagnosis not present

## 2021-09-14 DIAGNOSIS — M533 Sacrococcygeal disorders, not elsewhere classified: Secondary | ICD-10-CM | POA: Diagnosis not present

## 2021-09-14 DIAGNOSIS — G8929 Other chronic pain: Secondary | ICD-10-CM | POA: Diagnosis not present

## 2021-09-14 DIAGNOSIS — Z79899 Other long term (current) drug therapy: Secondary | ICD-10-CM | POA: Diagnosis not present

## 2021-09-14 DIAGNOSIS — M159 Polyosteoarthritis, unspecified: Secondary | ICD-10-CM | POA: Diagnosis not present

## 2021-09-14 DIAGNOSIS — Z5181 Encounter for therapeutic drug level monitoring: Secondary | ICD-10-CM | POA: Diagnosis not present

## 2021-09-16 DIAGNOSIS — M5416 Radiculopathy, lumbar region: Secondary | ICD-10-CM | POA: Diagnosis not present

## 2021-09-26 DIAGNOSIS — M5451 Vertebrogenic low back pain: Secondary | ICD-10-CM | POA: Diagnosis not present

## 2021-10-02 DIAGNOSIS — M6281 Muscle weakness (generalized): Secondary | ICD-10-CM | POA: Diagnosis not present

## 2021-10-02 DIAGNOSIS — M5451 Vertebrogenic low back pain: Secondary | ICD-10-CM | POA: Diagnosis not present

## 2021-10-04 DIAGNOSIS — M6281 Muscle weakness (generalized): Secondary | ICD-10-CM | POA: Diagnosis not present

## 2021-10-04 DIAGNOSIS — M5451 Vertebrogenic low back pain: Secondary | ICD-10-CM | POA: Diagnosis not present

## 2021-10-05 DIAGNOSIS — M5416 Radiculopathy, lumbar region: Secondary | ICD-10-CM | POA: Diagnosis not present

## 2021-10-09 DIAGNOSIS — M6281 Muscle weakness (generalized): Secondary | ICD-10-CM | POA: Diagnosis not present

## 2021-10-09 DIAGNOSIS — M5451 Vertebrogenic low back pain: Secondary | ICD-10-CM | POA: Diagnosis not present

## 2021-10-12 DIAGNOSIS — M5451 Vertebrogenic low back pain: Secondary | ICD-10-CM | POA: Diagnosis not present

## 2021-10-12 DIAGNOSIS — M6281 Muscle weakness (generalized): Secondary | ICD-10-CM | POA: Diagnosis not present

## 2021-10-16 DIAGNOSIS — M5451 Vertebrogenic low back pain: Secondary | ICD-10-CM | POA: Diagnosis not present

## 2021-10-16 DIAGNOSIS — M6281 Muscle weakness (generalized): Secondary | ICD-10-CM | POA: Diagnosis not present

## 2021-10-18 DIAGNOSIS — E1169 Type 2 diabetes mellitus with other specified complication: Secondary | ICD-10-CM | POA: Diagnosis not present

## 2021-10-18 DIAGNOSIS — D509 Iron deficiency anemia, unspecified: Secondary | ICD-10-CM | POA: Diagnosis not present

## 2021-10-18 DIAGNOSIS — I2510 Atherosclerotic heart disease of native coronary artery without angina pectoris: Secondary | ICD-10-CM | POA: Diagnosis not present

## 2021-10-18 DIAGNOSIS — H6123 Impacted cerumen, bilateral: Secondary | ICD-10-CM | POA: Diagnosis not present

## 2021-10-18 DIAGNOSIS — F5104 Psychophysiologic insomnia: Secondary | ICD-10-CM | POA: Diagnosis not present

## 2021-10-18 DIAGNOSIS — I10 Essential (primary) hypertension: Secondary | ICD-10-CM | POA: Diagnosis not present

## 2021-10-18 DIAGNOSIS — E785 Hyperlipidemia, unspecified: Secondary | ICD-10-CM | POA: Diagnosis not present

## 2021-10-19 DIAGNOSIS — M6281 Muscle weakness (generalized): Secondary | ICD-10-CM | POA: Diagnosis not present

## 2021-10-19 DIAGNOSIS — M5451 Vertebrogenic low back pain: Secondary | ICD-10-CM | POA: Diagnosis not present

## 2021-10-23 DIAGNOSIS — M5451 Vertebrogenic low back pain: Secondary | ICD-10-CM | POA: Diagnosis not present

## 2021-10-23 DIAGNOSIS — M6281 Muscle weakness (generalized): Secondary | ICD-10-CM | POA: Diagnosis not present

## 2021-10-24 ENCOUNTER — Ambulatory Visit: Payer: Medicare HMO | Admitting: Podiatry

## 2021-10-26 DIAGNOSIS — M5451 Vertebrogenic low back pain: Secondary | ICD-10-CM | POA: Diagnosis not present

## 2021-10-26 DIAGNOSIS — M6281 Muscle weakness (generalized): Secondary | ICD-10-CM | POA: Diagnosis not present

## 2021-10-30 ENCOUNTER — Ambulatory Visit: Payer: Medicare HMO | Admitting: Podiatry

## 2021-10-30 DIAGNOSIS — M6281 Muscle weakness (generalized): Secondary | ICD-10-CM | POA: Diagnosis not present

## 2021-10-30 DIAGNOSIS — L600 Ingrowing nail: Secondary | ICD-10-CM | POA: Diagnosis not present

## 2021-10-30 DIAGNOSIS — M5451 Vertebrogenic low back pain: Secondary | ICD-10-CM | POA: Diagnosis not present

## 2021-10-30 MED ORDER — CLINDAMYCIN HCL 150 MG PO CAPS
150.0000 mg | ORAL_CAPSULE | Freq: Three times a day (TID) | ORAL | 0 refills | Status: AC
Start: 2021-10-30 — End: 2021-11-04

## 2021-10-30 NOTE — Progress Notes (Addendum)
Subjective:  Patient ID: Erik Keith, male    DOB: 09-25-1940,  MRN: [id number]  Chief Complaint  Patient presents with   Ingrown Toenail    Right great toe has ingrown nail-bilateral borders     81 y.o. male presents with pain in the right hallux nail. Pain at both borders. Has tried to trim ingrown portions back but not successful. Has had ingrown removed on left hallux in past with success.   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   LIMA to LAD, Sequential SVG to OM1-OM2, SVG to PDA, EVH via right thigh   Type 2 diabetes mellitus (HCC)     Allergies  Allergen Reactions   Contrast Media [Iodinated Contrast Media] Hives   Penicillins Hives    Has patient had a PCN reaction causing immediate rash, facial/tongue/throat swelling, SOB or lightheadedness with hypotension: unkn Has patient had a PCN reaction causing severe rash involving mucus membranes or skin necrosis: unkn Has patient had a PCN reaction that required hospitalization: unkn Has patient had a PCN reaction occurring within the last 10 years: no If all of the above answers are "NO", then [place] proceed with Cephalosporin use.     ROS: Negative except as per HPI above  Objective:  General: AAO x3, NAD  Dermatological: Incurvation is present along the bilateral  nail border of the right great toe. There is localized edema without any erythema or  increase in warmth around the nail border. There is no drainage or pus. There is no ascending cellulitis. No malodor. No open lesions or pre-ulcerative lesions.    Vascular:  Dorsalis Pedis artery and Posterior Tibial artery pedal pulses are 2/4 bilateral.  Capillary fill time < 3 sec to all digits.   Neruologic: Grossly intact via light touch bilateral. Protective threshold intact to all sites bilateral.   Musculoskeletal: No gross boney pedal deformities bilateral. No pain, crepitus, or limitation noted with foot and ankle range of motion bilateral. Muscular strength 5/5 in all groups tested bilateral.  Gait: Unassisted, Nonantalgic.   Assessment:   1. Ingrown nail of great toe of right foot      Plan:  Patient was evaluated and treated and all questions answered.    Ingrown Nail, right hallux bilateral border -Patient elects to proceed with minor surgery to remove ingrown toenail today. Consent reviewed and signed by patient. -Ingrown nail excised. See procedure note. -Educated on post-procedure care including soaking. Written instructions provided and reviewed. - eRx for cephalexin clindamycin 150 mg TID x 5 days as pt has prosthetic heart valve -Patient to follow up in 2 weeks for nail check.  Procedure: Excision of Ingrown Toenail Location: Right 1st toe  bilateral  nail borders. Anesthesia: Lidocaine 1% plain; 1.5 mL and Marcaine 0.5% plain; 1.5 mL, digital block. Skin Prep: Betadine. Dressing: Silvadene; telfa; dry, sterile, compression  dressing. Technique: Following skin prep, the toe was exsanguinated and a tourniquet was secured at the base of the toe. The affected nail border was freed, split with a nail splitter, and excised. Chemical matrixectomy was then performed with phenol and irrigated out with alcohol. The tourniquet was then removed and sterile dressing applied. Disposition: Patient tolerated procedure well. Patient to return in 2 weeks for follow-up.     Return in about 2 weeks (around 11/13/2021) for Right hallux ingrown removal check.          Everitt Amber, DPM [facility]

## 2021-10-30 NOTE — Patient Instructions (Signed)
Soak Instructions    THE DAY AFTER THE PROCEDURE  Place 1/4 cup of epsom salts (or betadine, or white vinegar) in a quart of warm tap water.  Submerge your foot or feet with outer bandage intact for the initial soak; this will allow the bandage to become moist and wet for easy lift off.  Once you remove your bandage, continue to soak in the solution for 20 minutes.  This soak should be done twice a day.  Next, remove your foot or feet from solution, blot dry the affected area and cover.  You [place]kin use a band aid large enough to cover the area or use gauze and tape.  Apply other medications to the area as directed by the doctor such as polysporin neosporin.  IF YOUR SKIN BECOMES IRRITATED WHILE USING THESE INSTRUCTIONS, IT IS OKAY TO SWITCH TO  WHITE VINEGAR AND WATER. Or you [place] use antibacterial soap and water to keep the toe clean  Monitor for any signs/symptoms of infection. Call the office immediately if any occur or go directly to the emergency room. Call with any questions/concerns.    Long Term Care Instructions-Post Nail Surgery  You have had your ingrown toenail and root treated with a chemical.  This chemical causes a burn that will drain and ooze like a blister.  This can drain for 6-8 weeks or longer.  It is important to keep this area clean, covered, and follow the soaking instructions dispensed at the time of your surgery.  This area will eventually dry and form a scab.  Once the scab forms you no longer need to soak or apply a dressing.  If at any time you experience an increase in pain, redness, swelling, or drainage, you should contact the office as soon as possible.  

## 2021-10-30 NOTE — Addendum Note (Signed)
Addended by: Ames Coupe F on: 10/30/2021 02:10 PM   Modules accepted: Orders

## 2021-10-31 ENCOUNTER — Telehealth: Payer: Self-pay | Admitting: *Deleted

## 2021-10-31 NOTE — Telephone Encounter (Signed)
Patient is calling to ask if he should be using poly-neosporin or neosporin.  Returned the call back , no answer,left message that according to his instructions, could use either, to call back with any further questions.

## 2021-11-02 DIAGNOSIS — M6281 Muscle weakness (generalized): Secondary | ICD-10-CM | POA: Diagnosis not present

## 2021-11-02 DIAGNOSIS — M5451 Vertebrogenic low back pain: Secondary | ICD-10-CM | POA: Diagnosis not present

## 2021-11-06 DIAGNOSIS — M6281 Muscle weakness (generalized): Secondary | ICD-10-CM | POA: Diagnosis not present

## 2021-11-06 DIAGNOSIS — M5451 Vertebrogenic low back pain: Secondary | ICD-10-CM | POA: Diagnosis not present

## 2021-11-07 DIAGNOSIS — M48062 Spinal stenosis, lumbar region with neurogenic claudication: Secondary | ICD-10-CM | POA: Diagnosis not present

## 2021-11-07 DIAGNOSIS — M5416 Radiculopathy, lumbar region: Secondary | ICD-10-CM | POA: Diagnosis not present

## 2021-11-08 ENCOUNTER — Telehealth: Payer: Self-pay | Admitting: *Deleted

## 2021-11-08 NOTE — Telephone Encounter (Signed)
   Pre-operative Risk Assessment    Patient Name: Erik Keith  DOB: 01/31/1940 MRN: [id number]      Request for Surgical Clearance    Procedure:   L2-L3 DECOMPRESSION  Date of Surgery:  Clearance TBD                                 Surgeon:  DR. [facility] Surgeon's Group or Practice Name:  [facility] Phone number:  [phone] Fax number:  [phone] ATTN: Orson Slick   Type of Clearance Requested:   - Medical ; ASA   Type of Anesthesia:  General    Additional requests/questions:    Jiles Prows   11/08/2021, 9:54 AM

## 2021-11-08 NOTE — Telephone Encounter (Signed)
Pt agreeable to tele pre op appt 11/14/21 @ 9:40. Ned rec and consent are done.     Patient Consent for Virtual Visit        Erik Keith has provided verbal consent on 11/08/2021 for a virtual visit (video or telephone).   CONSENT FOR VIRTUAL VISIT FOR:  Erik Keith  By participating in this virtual visit I agree to the following:  I hereby voluntarily request, consent and authorize [facility] and its employed or contracted physicians, physician assistants, nurse practitioners or other licensed health care professionals (the Practitioner), to provide me with telemedicine health care services (the "Services") as deemed necessary by the treating Practitioner. I acknowledge and consent to receive the Services by the Practitioner via telemedicine. I understand that the telemedicine visit will involve communicating with the Practitioner through live audiovisual communication technology and the disclosure of certain medical information by electronic transmission. I acknowledge that I have been given the opportunity to request an in-person assessment or other available alternative prior to the telemedicine visit and am voluntarily participating in the telemedicine visit.  I understand that I have the right to withhold or withdraw my consent to the use of telemedicine in the course of my care at any time, without affecting my right to future care or treatment, and that the Practitioner or I [name] terminate the telemedicine visit at any time. I understand that I have the right to inspect all information obtained and/or recorded in the course of the telemedicine visit and [name] receive copies of available information for a reasonable fee.  I understand that some of the potential risks of receiving the Services via telemedicine include:  Delay or interruption in medical evaluation due to technological equipment failure or disruption; Information transmitted [name] not be sufficient (e.g. poor  resolution of images) to allow for appropriate medical decision making by the Practitioner; and/or  In rare instances, security protocols could fail, causing a breach of personal health information.  Furthermore, I acknowledge that it is my responsibility to provide information about my medical history, conditions and care that is complete and accurate to the best of my ability. I acknowledge that Practitioner's advice, recommendations, and/or decision [name] be based on factors not within their control, such as incomplete or inaccurate data provided by me or distortions of diagnostic images or specimens that [name] result from electronic transmissions. I understand that the practice of medicine is not an exact science and that Practitioner makes no warranties or guarantees regarding treatment outcomes. I acknowledge that a copy of this consent can be made available to me via my patient portal ([facility]), or I can request a printed copy by calling the office of [name].    I understand that my insurance will be billed for this visit.   I have read or had this consent read to me. I understand the contents of this consent, which adequately explains the benefits and risks of the Services being provided via telemedicine.  I have been provided ample opportunity to ask questions regarding this consent and the Services and have had my questions answered to my satisfaction. I give my informed consent for the services to be provided through the use of telemedicine in my medical care

## 2021-11-08 NOTE — Telephone Encounter (Signed)
   Name: Erik Keith  DOB: 1940/07/18  MRN: [id number]  Primary Cardiologist: Lauree Chandler, MD   Preoperative team, please contact this patient and set up a phone call appointment for further preoperative risk assessment. Please obtain consent and complete medication review. Thank you for your help.  I confirm that guidance regarding antiplatelet and oral anticoagulation therapy has been completed and, if necessary, noted below.  His aspirin Rogala be held for 5 to 7 days prior to his procedure.  Please resume as soon as hemostasis is achieved   Deberah Pelton, NP 11/08/2021, 12:32 PM [place]

## 2021-11-08 NOTE — Telephone Encounter (Signed)
Pt agreeable to tele pre op appt 11/14/21 @ 9:40. Ned rec and consent are done.

## 2021-11-09 DIAGNOSIS — M5451 Vertebrogenic low back pain: Secondary | ICD-10-CM | POA: Diagnosis not present

## 2021-11-09 DIAGNOSIS — M6281 Muscle weakness (generalized): Secondary | ICD-10-CM | POA: Diagnosis not present

## 2021-11-13 ENCOUNTER — Ambulatory Visit: Payer: Medicare HMO | Admitting: Podiatry

## 2021-11-13 DIAGNOSIS — M5451 Vertebrogenic low back pain: Secondary | ICD-10-CM | POA: Diagnosis not present

## 2021-11-13 DIAGNOSIS — L600 Ingrowing nail: Secondary | ICD-10-CM

## 2021-11-13 DIAGNOSIS — M6281 Muscle weakness (generalized): Secondary | ICD-10-CM | POA: Diagnosis not present

## 2021-11-13 NOTE — Progress Notes (Signed)
Subjective: Erik Keith is a 81 y.o.  male returns to office today for follow up evaluation after having right Hallux bilateral border nail ingrown removal with phenol and alcohol matrixectomy approximately 2 weeks ago. Patient has been soaking using epsom salts and applying topical antibiotic covered with bandaid daily. Patient denies fevers, chills, nausea, vomiting. Denies any calf pain, chest pain, SOB.   Objective:  Vitals: Reviewed  General: Well developed, nourished, in no acute distress, alert and oriented x3   Dermatology: Skin is warm, dry and supple bilateral. Right hallux nail border appears to be clean, dry, with mild granular tissue and surrounding scab. There is no surrounding erythema, edema, drainage/purulence. The remaining nails appear unremarkable at this time. There are no other lesions or other signs of infection present.  Neurovascular status: Intact. No lower extremity swelling; No pain with calf compression bilateral.  Musculoskeletal: Decreased tenderness to palpation of the right hallux nail fold(s). Muscular strength within normal limits bilateral.   Assesement and Plan: S/p phenol and alcohol matrixectomy to the  right hallux nail bilateral border, doing well.   -Continue soaking in epsom salts twice a day followed by antibiotic ointment and a band-aid. Can leave uncovered at night. Continue this until completely healed.  -If the area has not healed in 2 weeks, call the office for follow-up appointment, or sooner if any problems arise.  -Monitor for any signs/symptoms of infection. Call the office immediately if any occur or go directly to the emergency room. Call with any questions/concerns.        Everitt Amber, DPM [facility]                   11/13/2021

## 2021-11-14 ENCOUNTER — Ambulatory Visit: Payer: Medicare HMO | Attending: Cardiology | Admitting: Physician Assistant

## 2021-11-14 DIAGNOSIS — Z01810 Encounter for preprocedural cardiovascular examination: Secondary | ICD-10-CM

## 2021-11-14 NOTE — Progress Notes (Signed)
Virtual Visit via Telephone Note   Because of Erik Keith's co-morbid illnesses, he is at least at moderate risk for complications without adequate follow up.  This format is felt to be most appropriate for this patient at this time.  The patient did not have access to video technology/had technical difficulties with video requiring transitioning to audio format only (telephone).  All issues noted in this document were discussed and addressed.  No physical exam could be performed with this format.  Please refer to the patient's chart for his consent to telehealth for [facility].  Evaluation Performed:  Preoperative cardiovascular risk assessment _____________   Date:  11/14/2021   Patient ID:  Erik Keith, DOB 1940-03-16, MRN [id number] Patient Location:  Home Provider location:   Office  Primary Care Provider:  Nicoletta Dress, MD Primary Cardiologist:  Lauree Chandler, MD  Chief Complaint / Patient Profile   81 y.o. y/o male with a h/o severe aortic s/p AVR and CABG x 4, GERD, Hypertension, Hyperlipidemia, and type 2 DM  who is pending L2-L3 Decompression and presents today for telephonic preoperative cardiovascular risk assessment.  Past Medical History    Past Medical History:  Diagnosis Date   Aortic valve stenosis, severe    CAD S/P percutaneous coronary angioplasty    12-2006  STENT TO LAD;  07-2007 DEStenting TO RCA    GERD (gastroesophageal reflux disease)    Hypertension    Incidental pulmonary nodule, less than or equal to 26m 10/02/2017   Ground glass opacities RUL seen on CTA   Iron deficiency anemia    Mixed hyperlipidemia    OA (osteoarthritis) of shoulder    LEFT SHOULDER AND AC JOINT   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   23 mm Edwards Inspiris Resilia stented bioprosthetic tissue valve   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   LIMA to LAD, Sequential SVG to OM1-OM2, SVG to PDA, EVH via right thigh   Type 2  diabetes mellitus ([place]    Past Surgical History:  Procedure Laterality Date   AORTIC VALVE REPLACEMENT N/A 10/03/2017   Procedure: AORTIC VALVE REPLACEMENT (AVR);  Surgeon: ORexene Alberts MD;  [place] [facility]  Service: Open Heart Surgery;  Laterality: N/A;   APPENDECTOMY     BACK SURGERY  x3   CORONARY ARTERY BYPASS GRAFT N/A 10/03/2017   Procedure: CORONARY ARTERY BYPASS GRAFTING (CABG)x three, USING LEFT INTERNAL MAMMARY ARTERY AND RIGHT GREATER SAPHENOUS VEIN HARVESTED ENDOSCOPICALLY;  Surgeon: ORexene Alberts MD;  [place] [facility]  Service: Open Heart Surgery;  Laterality: N/A;   CORONARY/GRAFT ACUTE MI REVASCULARIZATION N/A 10/02/2017   Procedure: Coronary/Graft Acute MI Revascularization;  Surgeon: VJettie Booze MD;  [facility];  Service: Cardiovascular;  Laterality: N/A;   LEFT HEART CATH AND CORONARY ANGIOGRAPHY N/A 10/02/2017   Procedure: LEFT HEART CATH AND CORONARY ANGIOGRAPHY;  Surgeon: VJettie Booze MD;  [facility];  Service: Cardiovascular;  Laterality: N/A;   RIGHT/LEFT HEART CATH AND CORONARY ANGIOGRAPHY N/A 09/12/2017   Procedure: RIGHT/LEFT HEART CATH AND CORONARY ANGIOGRAPHY;  Surgeon: MBurnell Blanks MD;  [facility];  Service: Cardiovascular;  Laterality: N/A;   TONSILLECTOMY      Allergies  Allergies  Allergen Reactions   Contrast Media [Iodinated Contrast Media] Hives   Penicillins Hives    Has patient had a PCN reaction causing immediate rash, facial/tongue/throat swelling, SOB or lightheadedness with hypotension: unkn Has  patient had a PCN reaction causing severe rash involving mucus membranes or skin necrosis: unkn Has patient had a PCN reaction that required hospitalization: unkn Has patient had a PCN reaction occurring within the last 10 years: no If all of the above answers are "NO", then Stelly proceed with Cephalosporin use.     History of Present Illness    Erik Keith  is a 81 y.o. male who presents via audio/video conferencing for a telehealth visit today.  Pt was last seen in cardiology clinic on 05/24/21 by Leanor Kail, Carlos.  At that time Erik Keith was doing well other than a mechanical fall.  The patient is now pending procedure as outlined above. Since his last visit, he had had no issues with SOB or CP. He has not had any fast heart beats. He has been out working in the yard without any heart issues but his back has been bothering him. He tells him his recent echocardiogram was stable. He tells me that he keeps himself in good shape. He states he walks on the treadmill 20-30 minutes a day. He used to go to the gym 7 days a week but has not been in the last 4 months since he has been doing physical therapy. He has only good things to say about the wonderful people at [facility].   His aspirin [place]beck be held for 5 to 7 days prior to his procedure.  Please resume as soon as hemostasis is achieved.    Home Medications    Prior to Admission medications   Medication Sig Start Date End Date Taking? Authorizing Provider  acetaminophen (TYLENOL) 500 MG tablet Take 2 tablets (1,000 mg total) by mouth every 6 (six) hours as needed for mild pain or fever. 10/08/17   Barrett, Erin R, PA-C  amLODipine (NORVASC) 10 MG tablet Take 1 tablet (10 mg total) by mouth daily. 05/24/21   Leanor Kail, PA  aspirin EC 81 MG tablet Take 1 tablet (81 mg total) by mouth daily. Swallow whole. 05/23/20   Burnell Blanks, MD  atorvastatin (LIPITOR) 20 MG tablet TAKE 1 TABLET BY MOUTH DAILY AT 6 PM. 03/20/21   Burnell Blanks, MD  atorvastatin (LIPITOR) 40 MG tablet Take by mouth. 04/15/21   [provider]  Cyanocobalamin (B-12) 1000 MCG TABS Take 1 tablet by mouth daily.    [provider]  glipiZIDE (GLUCOTROL XL) 5 MG 24 hr tablet Take 1 tablet by mouth daily with breakfast. 12/09/19   [provider]  glucose blood (ONETOUCH ULTRA)  test strip USE TO CHECK BLOOD SUGAR ONCE A DAY E11.9 02/23/19   [provider]  hydrochlorothiazide (MICROZIDE) 12.5 MG capsule Take 1 capsule (12.5 mg total) by mouth daily. 08/04/21   Burnell Blanks, MD  HYDROcodone-acetaminophen (NORCO) 10-325 MG tablet Take 1 tablet by mouth 2 (two) times daily.    [provider]  isosorbide mononitrate (IMDUR) 30 MG 24 hr tablet Take 1 tablet (30 mg total) by mouth daily. 05/24/21   Bhagat, Crista Luria, PA  lisinopril (ZESTRIL) 20 MG tablet Take 1 tablet (20 mg total) by mouth daily. 05/24/21   Leanor Kail, PA  metFORMIN (GLUCOPHAGE) 500 MG tablet Take 1 tablet by mouth 2 (two) times daily with a meal. 01/27/20   [provider]  nitroGLYCERIN (NITROSTAT) 0.4 MG SL tablet Place 1 tablet (0.4 mg total) under the tongue every 5 (five) minutes as needed for chest pain. 10/22/17   Imogene Burn,  PA-C  pantoprazole (PROTONIX) 40 MG tablet Take 1 tablet by mouth daily. 12/09/19   [provider]  Polyethylene Glycol 3350 (MIRALAX PO) Take 17 g by mouth daily. WITH COFFEE    [provider]  Wheat Dextrin (BENEFIBER) POWD Benefiber, Mix 1 tablespoon with a liquid and drink once daily.    [provider]  zolpidem (AMBIEN) 10 MG tablet Take 10 mg by mouth at bedtime as needed for sleep.    [provider]    Physical Exam    Vital Signs:  Erik Keith does not have vital signs available for review today.  Given telephonic nature of communication, physical exam is limited. AAOx3. NAD. Normal affect.  Speech and respirations are unlabored.  Accessory Clinical Findings    None  Assessment & Plan    1.  Preoperative Cardiovascular Risk Assessment:  Erik Keith perioperative risk of a major cardiac event is 6.6% according to the Revised Cardiac Risk Index (RCRI).  Therefore, he is at high risk for perioperative complications.   His functional capacity is good at 6.36 METs according to  the [place] Activity Status Index (DASI). Recommendations: According to ACC/AHA guidelines, no further cardiovascular testing needed.  The patient Davee proceed to surgery at acceptable risk.   Antiplatelet and/or Anticoagulation Recommendations: Aspirin can be held for 5-7 days prior to his surgery.  Please resume Aspirin post operatively when it is felt to be safe from a bleeding standpoint.  A copy of this note will be routed to requesting surgeon.  Time:   Today, I have spent 20 minutes with the patient with telehealth technology discussing medical history, symptoms, and management plan.     Elgie Collard, PA-C  11/14/2021, 9:42 AM

## 2021-11-16 DIAGNOSIS — M5451 Vertebrogenic low back pain: Secondary | ICD-10-CM | POA: Diagnosis not present

## 2021-11-16 DIAGNOSIS — M6281 Muscle weakness (generalized): Secondary | ICD-10-CM | POA: Diagnosis not present

## 2021-11-21 ENCOUNTER — Ambulatory Visit (HOSPITAL_COMMUNITY): Payer: Self-pay | Admitting: Orthopedic Surgery

## 2021-11-30 NOTE — Pre-Procedure Instructions (Signed)
Surgical Instructions    Your procedure is scheduled on Monday December 4.  Report to [facility] Main Entrance "A" at 11:30 A.M., then check in with the Admitting office.  Call this number if you have problems the morning of surgery:  [phone]  If you have any questions prior to your surgery date call [phone]: Open Monday-Friday 8am-4pm  If you experience any cold, Covid, or flu symptoms such as cough, fever, chills, shortness of breath, etc. between now and your scheduled surgery, please notify us at the above number   Patient Instructions   The night before surgery:    No food after midnight. ONLY clear liquids after midnight   The day of surgery:    You Erik Keith drink clear liquids until 10:30 the morning of your surgery.   Clear liquids allowed are: Water, Non-Citrus Juices (without pulp), Carbonated Beverages, Clear Tea, Black Coffee ONLY (NO MILK, CREAM OR POWDERED CREAMER of any kind), and Gatorade           If you have questions, please contact your surgeon's office.    Take these medications the morning of surgery with A SIP OF WATER:  amLODipine (NORVASC)  pantoprazole (PROTONIX)  isosorbide mononitrate (IMDUR)   You Erik Keith take these medications with A SIP OF WATER IF YOU NEED THEM: HYDROcodone-acetaminophen (NORCO)  nitroGLYCERIN (NITROSTAT)   Follow your surgeon's instructions on when to stop Aspirin.  If no instructions were given by your surgeon then you will need to call the office to get those instructions.    As of today, STOP taking any (unless otherwise instructed by your surgeon) Aleve, Naproxen, Ibuprofen, Motrin, Advil, Goody's, BC's, all herbal medications, fish oil, and all vitamins.  WHAT DO I DO ABOUT MY DIABETES MEDICATION?  Do not take metFORMIN (GLUCOPHAGE) or glipiZIDE (GLUCOTROL XL)  the morning of surgery.   HOW TO MANAGE YOUR DIABETES BEFORE AND AFTER SURGERY  Why is it important to control my blood sugar before and after  surgery? Improving blood sugar levels before and after surgery helps healing and can limit problems. A way of improving blood sugar control is eating a healthy diet by:  Eating less sugar and carbohydrates  Increasing activity/exercise  Talking with your doctor about reaching your blood sugar goals High blood sugars (greater than 180 mg/dL) can raise your risk of infections and slow your recovery, so you will need to focus on controlling your diabetes during the weeks before surgery. Make sure that the doctor who takes care of your diabetes knows about your planned surgery including the date and location.  How do I manage my blood sugar before surgery? Check your blood sugar at least 4 times a day, starting 2 days before surgery, to make sure that the level is not too high or low.  Check your blood sugar the morning of your surgery when you wake up and every 2 hours until you get to the Short Stay unit.  If your blood sugar is less than 70 mg/dL, you will need to treat for low blood sugar: Do not take insulin. Treat a low blood sugar (less than 70 mg/dL) with  cup of clear juice (cranberry or apple), 4 glucose tablets, OR glucose gel. Recheck blood sugar in 15 minutes after treatment (to make sure it is greater than 70 mg/dL). If your blood sugar is not greater than 70 mg/dL on recheck, call [phone] for further instructions. Report your blood sugar to the short stay nurse when you get to Short  Stay.  If you are admitted to the hospital after surgery: Your blood sugar will be checked by the staff and you will probably be given insulin after surgery (instead of oral diabetes medicines) to make sure you have good blood sugar levels. The goal for blood sugar control after surgery is 80-180 mg/dL.          Do NOT Smoke (Tobacco/Vaping)  24 hours prior to your procedure  If you use a CPAP at night, you Erik Keith bring your mask for your overnight stay.   Contacts, glasses, hearing aids,  dentures or partials Erik Keith not be worn into surgery, please bring cases for these belongings   For patients admitted to the hospital, discharge time will be determined by your treatment team.   Patients discharged the day of surgery will not be allowed to drive home, and someone needs to stay with them for 24 hours.   SURGICAL WAITING ROOM VISITATION Patients having surgery or a procedure Erik Keith have no more than 2 support people in the waiting area - these visitors Erik Keith rotate.   Children under the age of 89 must have an adult with them who is not the patient. If the patient needs to stay at the hospital during part of their recovery, the visitor guidelines for inpatient rooms apply. Pre-op nurse will coordinate an appropriate time for 1 support person to accompany patient in pre-op.  This support person Erik Keith not rotate.   Please refer to the [facility] website for the visitor guidelines for Inpatients (after your surgery is over and you are in a regular room).    Special instructions:    Oral Hygiene is also important to reduce your risk of infection.  Remember -  BRUSH YOUR TEETH THE MORNING OF SURGERY WITH YOUR REGULAR TOOTHPASTE   [place]- Preparing For Surgery  Before surgery, you can play an important role. Because skin is not sterile, your skin needs to be as free of germs as possible. You can reduce the number of germs on your skin by washing with CHG (chlorahexidine gluconate) Soap before surgery.  CHG is an antiseptic cleaner which kills germs and bonds with the skin to continue killing germs even after washing.     Please do not use if you have an allergy to CHG or antibacterial soaps. If your skin becomes reddened/irritated stop using the CHG.  Do not shave (including legs and underarms) for at least 48 hours prior to first CHG shower. It is OK to shave your face.  Please follow these instructions carefully.    Shower the NIGHT BEFORE SURGERY and the MORNING OF SURGERY with  CHG Soap.  If you chose to wash your hair, wash your hair first as usual with your normal shampoo.  After you shampoo, rinse your hair and body thoroughly to remove the shampoo.   Then [employer] and genitals (private parts) with your normal soap and rinse thoroughly to remove soap.  After that Use CHG Soap as you would any other liquid soap.  You can apply CHG directly to the skin and wash gently with a scrungie or a clean washcloth.   Apply the CHG Soap to your body ONLY FROM THE NECK DOWN.   Do not use on open wounds or open sores.  Avoid contact with your eyes, ears, mouth and genitals (private parts).  Wash thoroughly, paying special attention to the area where your surgery will be performed.  Thoroughly rinse your body with warm water from the neck  down.  DO NOT shower/wash with your normal soap after using and rinsing off the CHG Soap.  Pat yourself dry with a CLEAN TOWEL.  Wear CLEAN PAJAMAS to bed the night before surgery  Place CLEAN SHEETS on your bed the night before your surgery  DO NOT SLEEP WITH PETS.   Day of Surgery:  Take a shower with CHG soap. Wear Clean/Comfortable clothing the morning of surgery Brush your teeth WITH YOUR REGULAR TOOTHPASTE. Do not wear jewelry. Do not wear lotions, powders, cologne or deodorant. Men Boerema shave face and neck. Do not bring valuables to the hospital.  [facility] is not responsible for any belongings or valuables.   If you received a COVID test during your pre-op visit, it is requested that you wear a mask when out in public, stay away from anyone that Miera not be feeling well, and notify your surgeon if you develop symptoms. If you have been in contact with anyone that has tested positive in the last 10 days, please notify your surgeon.    Please read over the following fact sheets that you were given.

## 2021-12-01 ENCOUNTER — Other Ambulatory Visit: Payer: Self-pay

## 2021-12-01 ENCOUNTER — Encounter (HOSPITAL_COMMUNITY)
Admission: RE | Admit: 2021-12-01 | Discharge: 2021-12-01 | Disposition: A | Discharge status: Home / Self Care | Payer: Medicare HMO | Source: Ambulatory Visit | Attending: Orthopedic Surgery | Admitting: Orthopedic Surgery

## 2021-12-01 ENCOUNTER — Encounter (HOSPITAL_COMMUNITY): Payer: Self-pay

## 2021-12-01 VITALS — BP 141/73 | HR 47 | Temp 97.5°F | Resp 17 | Ht 71.0 in | Wt 193.0 lb

## 2021-12-01 DIAGNOSIS — I252 Old myocardial infarction: Secondary | ICD-10-CM | POA: Insufficient documentation

## 2021-12-01 DIAGNOSIS — E1159 Type 2 diabetes mellitus with other circulatory complications: Secondary | ICD-10-CM

## 2021-12-01 DIAGNOSIS — K219 Gastro-esophageal reflux disease without esophagitis: Secondary | ICD-10-CM | POA: Diagnosis not present

## 2021-12-01 DIAGNOSIS — M48061 Spinal stenosis, lumbar region without neurogenic claudication: Secondary | ICD-10-CM | POA: Diagnosis not present

## 2021-12-01 DIAGNOSIS — I2510 Atherosclerotic heart disease of native coronary artery without angina pectoris: Secondary | ICD-10-CM | POA: Insufficient documentation

## 2021-12-01 DIAGNOSIS — M5416 Radiculopathy, lumbar region: Secondary | ICD-10-CM | POA: Diagnosis not present

## 2021-12-01 DIAGNOSIS — D631 Anemia in chronic kidney disease: Secondary | ICD-10-CM | POA: Diagnosis not present

## 2021-12-01 DIAGNOSIS — I25110 Atherosclerotic heart disease of native coronary artery with unstable angina pectoris: Secondary | ICD-10-CM

## 2021-12-01 DIAGNOSIS — Z01812 Encounter for preprocedural laboratory examination: Secondary | ICD-10-CM | POA: Diagnosis not present

## 2021-12-01 DIAGNOSIS — Z87891 Personal history of nicotine dependence: Secondary | ICD-10-CM | POA: Diagnosis not present

## 2021-12-01 DIAGNOSIS — Z951 Presence of aortocoronary bypass graft: Secondary | ICD-10-CM | POA: Diagnosis not present

## 2021-12-01 DIAGNOSIS — I129 Hypertensive chronic kidney disease with stage 1 through stage 4 chronic kidney disease, or unspecified chronic kidney disease: Secondary | ICD-10-CM | POA: Diagnosis not present

## 2021-12-01 DIAGNOSIS — E1122 Type 2 diabetes mellitus with diabetic chronic kidney disease: Secondary | ICD-10-CM | POA: Insufficient documentation

## 2021-12-01 DIAGNOSIS — N189 Chronic kidney disease, unspecified: Secondary | ICD-10-CM | POA: Diagnosis not present

## 2021-12-01 DIAGNOSIS — Z01818 Encounter for other preprocedural examination: Secondary | ICD-10-CM

## 2021-12-01 DIAGNOSIS — I10 Essential (primary) hypertension: Secondary | ICD-10-CM

## 2021-12-01 DIAGNOSIS — M5451 Vertebrogenic low back pain: Secondary | ICD-10-CM | POA: Diagnosis not present

## 2021-12-01 HISTORY — DX: Chronic kidney disease, unspecified: N18.9

## 2021-12-01 HISTORY — DX: Monoclonal gammopathy: D47.2

## 2021-12-01 LAB — BASIC METABOLIC PANEL
Anion gap: 9 (ref 5–15)
BUN: 29 mg/dL — ABNORMAL HIGH (ref 8–23)
CO2: 28 mmol/L (ref 22–32)
Calcium: 10.3 mg/dL (ref 8.9–10.3)
Chloride: 105 mmol/L (ref 98–111)
Creatinine, Ser: 1.67 mg/dL — ABNORMAL HIGH (ref 0.61–1.24)
GFR, Estimated: 41 mL/min — ABNORMAL LOW (ref >60)
Glucose, Bld: 155 mg/dL — ABNORMAL HIGH (ref 70–99)
Potassium: 4.2 mmol/L (ref 3.5–5.1)
Sodium: 142 mmol/L (ref 135–145)

## 2021-12-01 LAB — SURGICAL PCR SCREEN
MRSA, PCR: NEGATIVE
Staphylococcus aureus: NEGATIVE

## 2021-12-01 LAB — CBC
HCT: 45 % (ref 39.0–52.0)
Hemoglobin: 15.3 g/dL (ref 13.0–17.0)
MCH: 31.9 pg (ref 26.0–34.0)
MCHC: 34 g/dL (ref 30.0–36.0)
MCV: 93.8 fL (ref 80.0–100.0)
Platelets: 145 10*3/uL — ABNORMAL LOW (ref 150–400)
RBC: 4.8 MIL/uL (ref 4.22–5.81)
RDW: 13.2 % (ref 11.5–15.5)
WBC: 9.3 10*3/uL (ref 4.0–10.5)
nRBC: 0 % (ref 0.0–0.2)

## 2021-12-01 LAB — GLUCOSE, CAPILLARY: Glucose-Capillary: 191 mg/dL — ABNORMAL HIGH (ref 70–99)

## 2021-12-01 NOTE — Progress Notes (Signed)
PCP - Nelda Bucks, MD Cardiologist - Dr. Angelena Form  PPM/ICD -    Chest x-ray - NI EKG - 05/24/21 Stress Test - 10/02/17 ECHO - 06/19/21 Cardiac Cath - 10/02/17  Sleep Study - Denies  DM Type II CBG @ PAT appt 191 Fasting Blood Sugar - 97-130 Checks Blood Sugar _2-3____ times a week  Blood Thinner Instructions:Denies Aspirin Instructions: Per patient instructed to stop 7 days prior  COVID TEST- NI   Anesthesia review: Yes cardiac history  Patient denies shortness of breath, fever, cough and chest pain at PAT appointment   All instructions explained to the patient, with a verbal understanding of the material. Patient agrees to go over the instructions while at home for a better understanding. The opportunity to ask questions was provided.

## 2021-12-03 NOTE — Anesthesia Preprocedure Evaluation (Signed)
Anesthesia Evaluation  Patient identified by MRN, date of birth, ID band Patient awake    Reviewed: Allergy & Precautions, NPO status , Patient's Chart, lab work & pertinent test results  Airway Mallampati: II  TM Distance: >3 FB Neck ROM: Full    Dental  (+) Edentulous Upper, Edentulous Lower, Dental Advisory Given   Pulmonary former smoker   Pulmonary exam normal breath sounds clear to auscultation       Cardiovascular hypertension, Pt. on medications + angina  + CAD, + Past MI and + CABG  Normal cardiovascular exam+ Valvular Problems/Murmurs MR  Rhythm:Regular Rate:Normal  Echo   1. Left ventricular ejection fraction, by estimation, is 60 to 65%. The left ventricle has normal function. The left ventricle has no regional wall motion abnormalities. Left ventricular diastolic parameters are indeterminate.   2. Right ventricular systolic function is normal. The right ventricular size is normal. There is normal pulmonary artery systolic pressure. The estimated right ventricular systolic pressure is 45.6 mmHg.   3. The mitral valve is normal in structure. Mild mitral valve regurgitation. No evidence of mitral stenosis.   4. The aortic valve has been repaired/replaced. Aortic valve regurgitation is not visualized. No aortic stenosis is present. 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 Procedure Date: 10/03/2017. Echo findings are consistent with normal structure and function of the aortic valve prosthesis. Aortic valve mean gradient measures 8.4 mmHg. Aortic valve Vmax measures 1.97 m/s.   5. The inferior vena cava is normal in size with greater than 50% respiratory variability, suggesting right atrial pressure of 3 mmHg.     Neuro/Psych negative neurological ROS     GI/Hepatic Neg liver ROS,GERD  ,,  Endo/Other  diabetes    Renal/GU negative Renal ROS      Musculoskeletal  (+) Arthritis ,    Abdominal   Peds  Hematology  (+) Blood dyscrasia, anemia   Anesthesia Other Findings   Reproductive/Obstetrics                              Anesthesia Physical Anesthesia Plan  ASA: 3  Anesthesia Plan: General   Post-op Pain Management: Tylenol PO (pre-op)*   Induction: Intravenous  PONV Risk Score and Plan: 3 and Ondansetron, Dexamethasone and Treatment Skalski vary due to age or medical condition  Airway Management Planned: Oral ETT  Additional Equipment:   Intra-op Plan:   Post-operative Plan: Extubation in OR  Informed Consent: I have reviewed the patients History and Physical, chart, labs and discussed the procedure including the risks, benefits and alternatives for the proposed anesthesia with the patient or authorized representative who has indicated his/her understanding and acceptance.     Dental advisory given  Plan Discussed with: CRNA  Anesthesia Plan Comments:         Anesthesia Quick Evaluation

## 2021-12-04 ENCOUNTER — Ambulatory Visit (HOSPITAL_COMMUNITY): Payer: Medicare HMO

## 2021-12-04 ENCOUNTER — Ambulatory Visit (HOSPITAL_COMMUNITY): Payer: Medicare HMO | Admitting: Anesthesiology

## 2021-12-04 ENCOUNTER — Encounter (HOSPITAL_COMMUNITY)
Admission: RE | Disposition: A | Discharge status: Home Health | Payer: Self-pay | Source: Home / Self Care | Attending: Orthopedic Surgery

## 2021-12-04 ENCOUNTER — Encounter (HOSPITAL_COMMUNITY): Payer: Self-pay | Admitting: Orthopedic Surgery

## 2021-12-04 ENCOUNTER — Encounter (HOSPITAL_COMMUNITY): Payer: Self-pay

## 2021-12-04 ENCOUNTER — Other Ambulatory Visit: Payer: Self-pay

## 2021-12-04 ENCOUNTER — Observation Stay (HOSPITAL_COMMUNITY)
Admission: RE | Admit: 2021-12-04 | Discharge: 2021-12-05 | Disposition: A | Discharge status: Home Health | Payer: Medicare HMO | Attending: Orthopedic Surgery | Admitting: Orthopedic Surgery

## 2021-12-04 DIAGNOSIS — I25119 Atherosclerotic heart disease of native coronary artery with unspecified angina pectoris: Secondary | ICD-10-CM | POA: Diagnosis not present

## 2021-12-04 DIAGNOSIS — E1122 Type 2 diabetes mellitus with diabetic chronic kidney disease: Secondary | ICD-10-CM | POA: Diagnosis not present

## 2021-12-04 DIAGNOSIS — I10 Essential (primary) hypertension: Secondary | ICD-10-CM

## 2021-12-04 DIAGNOSIS — Z7984 Long term (current) use of oral hypoglycemic drugs: Secondary | ICD-10-CM | POA: Insufficient documentation

## 2021-12-04 DIAGNOSIS — Z981 Arthrodesis status: Secondary | ICD-10-CM | POA: Diagnosis not present

## 2021-12-04 DIAGNOSIS — N189 Chronic kidney disease, unspecified: Secondary | ICD-10-CM | POA: Insufficient documentation

## 2021-12-04 DIAGNOSIS — I2510 Atherosclerotic heart disease of native coronary artery without angina pectoris: Secondary | ICD-10-CM | POA: Diagnosis not present

## 2021-12-04 DIAGNOSIS — I252 Old myocardial infarction: Secondary | ICD-10-CM

## 2021-12-04 DIAGNOSIS — Z87891 Personal history of nicotine dependence: Secondary | ICD-10-CM | POA: Diagnosis not present

## 2021-12-04 DIAGNOSIS — I129 Hypertensive chronic kidney disease with stage 1 through stage 4 chronic kidney disease, or unspecified chronic kidney disease: Secondary | ICD-10-CM | POA: Insufficient documentation

## 2021-12-04 DIAGNOSIS — M48061 Spinal stenosis, lumbar region without neurogenic claudication: Secondary | ICD-10-CM | POA: Diagnosis not present

## 2021-12-04 DIAGNOSIS — Z952 Presence of prosthetic heart valve: Secondary | ICD-10-CM | POA: Diagnosis not present

## 2021-12-04 DIAGNOSIS — Z951 Presence of aortocoronary bypass graft: Secondary | ICD-10-CM | POA: Diagnosis not present

## 2021-12-04 DIAGNOSIS — Z79899 Other long term (current) drug therapy: Secondary | ICD-10-CM | POA: Diagnosis not present

## 2021-12-04 DIAGNOSIS — M5416 Radiculopathy, lumbar region: Secondary | ICD-10-CM | POA: Insufficient documentation

## 2021-12-04 DIAGNOSIS — E1159 Type 2 diabetes mellitus with other circulatory complications: Secondary | ICD-10-CM

## 2021-12-04 DIAGNOSIS — Z7982 Long term (current) use of aspirin: Secondary | ICD-10-CM | POA: Insufficient documentation

## 2021-12-04 HISTORY — PX: LUMBAR LAMINECTOMY/DECOMPRESSION MICRODISCECTOMY: SHX5026

## 2021-12-04 LAB — GLUCOSE, CAPILLARY
Glucose-Capillary: 125 mg/dL — ABNORMAL HIGH (ref 70–99)
Glucose-Capillary: 111 mg/dL — ABNORMAL HIGH (ref 70–99)
Glucose-Capillary: 176 mg/dL — ABNORMAL HIGH (ref 70–99)
Glucose-Capillary: 205 mg/dL — ABNORMAL HIGH (ref 70–99)

## 2021-12-04 SURGERY — LUMBAR LAMINECTOMY/DECOMPRESSION MICRODISCECTOMY 1 LEVEL
Anesthesia: General

## 2021-12-04 MED ORDER — ACETAMINOPHEN 650 MG RE SUPP: 650.0000 mg | RECTAL | Status: DC | PRN

## 2021-12-04 MED ORDER — LACTATED RINGERS IV SOLN: INTRAVENOUS | Status: DC

## 2021-12-04 MED ORDER — CHLORHEXIDINE GLUCONATE 0.12 % MT SOLN: 15.0000 mL | Freq: Once | OROMUCOSAL | Status: AC

## 2021-12-04 MED ORDER — BUPIVACAINE-EPINEPHRINE 0.25% -1:200000 IJ SOLN
INTRAMUSCULAR | Status: DC | PRN
  Administered 2021-12-04: 10 mL

## 2021-12-04 MED ORDER — GLIPIZIDE ER 5 MG PO TB24
5.0000 mg | ORAL_TABLET | Freq: Every day | ORAL | Status: DC
  Administered 2021-12-05: 5 mg via ORAL
  Filled 2021-12-04: qty 1

## 2021-12-04 MED ORDER — ONDANSETRON HCL 4 MG PO TABS: 4.0000 mg | ORAL_TABLET | Freq: Three times a day (TID) | ORAL | 0 refills | Status: DC | PRN

## 2021-12-04 MED ORDER — EPHEDRINE SULFATE-NACL 50-0.9 MG/10ML-% IV SOSY
PREFILLED_SYRINGE | INTRAVENOUS | Status: DC | PRN
  Administered 2021-12-04 (×2): 5 mg via INTRAVENOUS

## 2021-12-04 MED ORDER — ACETAMINOPHEN 500 MG PO TABS: 1000.0000 mg | ORAL_TABLET | Freq: Once | ORAL | Status: AC

## 2021-12-04 MED ORDER — OXYCODONE HCL 5 MG PO TABS: 5.0000 mg | ORAL_TABLET | ORAL | Status: DC | PRN

## 2021-12-04 MED ORDER — BUPIVACAINE-EPINEPHRINE (PF) 0.25% -1:200000 IJ SOLN
INTRAMUSCULAR | Status: AC
  Filled 2021-12-04: qty 30

## 2021-12-04 MED ORDER — BACITRACIN-NEOMYCIN-POLYMYXIN OINTMENT TUBE
TOPICAL_OINTMENT | CUTANEOUS | Status: AC
  Filled 2021-12-04: qty 14.17

## 2021-12-04 MED ORDER — POLYETHYLENE GLYCOL 3350 17 G PO PACK
17.0000 g | PACK | Freq: Every day | ORAL | Status: DC | PRN
  Administered 2021-12-04: 17 g via ORAL
  Filled 2021-12-04: qty 1

## 2021-12-04 MED ORDER — FENTANYL CITRATE (PF) 250 MCG/5ML IJ SOLN
INTRAMUSCULAR | Status: DC | PRN
  Administered 2021-12-04: 25 ug via INTRAVENOUS
  Administered 2021-12-04: 100 ug via INTRAVENOUS
  Administered 2021-12-04 (×2): 50 ug via INTRAVENOUS
  Administered 2021-12-04: 25 ug via INTRAVENOUS

## 2021-12-04 MED ORDER — CEFAZOLIN SODIUM-DEXTROSE 1-4 GM/50ML-% IV SOLN
1.0000 g | Freq: Three times a day (TID) | INTRAVENOUS | Status: AC
  Administered 2021-12-04 – 2021-12-05 (×2): 1 g via INTRAVENOUS
  Filled 2021-12-04 (×2): qty 50

## 2021-12-04 MED ORDER — SUGAMMADEX SODIUM 200 MG/2ML IV SOLN
INTRAVENOUS | Status: DC | PRN
  Administered 2021-12-04: 200 mg via INTRAVENOUS

## 2021-12-04 MED ORDER — PHENYLEPHRINE HCL-NACL 20-0.9 MG/250ML-% IV SOLN
INTRAVENOUS | Status: DC | PRN
  Administered 2021-12-04: 20 ug/min via INTRAVENOUS

## 2021-12-04 MED ORDER — THROMBIN 20000 UNITS EX KIT
PACK | CUTANEOUS | Status: AC
  Filled 2021-12-04: qty 1

## 2021-12-04 MED ORDER — SODIUM CHLORIDE 0.9% FLUSH: 3.0000 mL | INTRAVENOUS | Status: DC | PRN

## 2021-12-04 MED ORDER — SODIUM CHLORIDE 0.9% FLUSH
3.0000 mL | Freq: Two times a day (BID) | INTRAVENOUS | Status: DC
  Administered 2021-12-04: 3 mL via INTRAVENOUS

## 2021-12-04 MED ORDER — MAGNESIUM CITRATE PO SOLN: 1.0000 | Freq: Once | ORAL | Status: DC | PRN

## 2021-12-04 MED ORDER — INSULIN ASPART 100 UNIT/ML IJ SOLN: 0.0000 [IU] | Freq: Three times a day (TID) | INTRAMUSCULAR | Status: DC

## 2021-12-04 MED ORDER — SODIUM CHLORIDE 0.9 % IV SOLN
250.0000 mL | INTRAVENOUS | Status: DC
  Administered 2021-12-04: 250 mL via INTRAVENOUS

## 2021-12-04 MED ORDER — FERROUS SULFATE 325 (65 FE) MG PO TABS
325.0000 mg | ORAL_TABLET | ORAL | Status: DC
  Administered 2021-12-04: 325 mg via ORAL
  Filled 2021-12-04: qty 1

## 2021-12-04 MED ORDER — CEFAZOLIN SODIUM-DEXTROSE 2-3 GM-%(50ML) IV SOLR
INTRAVENOUS | Status: DC | PRN
  Administered 2021-12-04: 2 g via INTRAVENOUS

## 2021-12-04 MED ORDER — METHOCARBAMOL 1000 MG/10ML IJ SOLN: 500.0000 mg | Freq: Four times a day (QID) | INTRAVENOUS | Status: DC | PRN

## 2021-12-04 MED ORDER — INSULIN ASPART 100 UNIT/ML IJ SOLN: 0.0000 [IU] | Freq: Every day | INTRAMUSCULAR | Status: DC

## 2021-12-04 MED ORDER — OXYCODONE HCL 5 MG PO TABS
10.0000 mg | ORAL_TABLET | ORAL | Status: DC | PRN
  Administered 2021-12-04 – 2021-12-05 (×3): 10 mg via ORAL
  Filled 2021-12-04 (×3): qty 2

## 2021-12-04 MED ORDER — PROPOFOL 10 MG/ML IV BOLUS
INTRAVENOUS | Status: DC | PRN
  Administered 2021-12-04: 150 mg via INTRAVENOUS

## 2021-12-04 MED ORDER — ATORVASTATIN CALCIUM 40 MG PO TABS
40.0000 mg | ORAL_TABLET | Freq: Every day | ORAL | Status: DC
  Administered 2021-12-04: 40 mg via ORAL
  Filled 2021-12-04: qty 1

## 2021-12-04 MED ORDER — METHYLPREDNISOLONE ACETATE 40 MG/ML INJ SUSP (RADIOLOG: INTRAMUSCULAR | Status: DC | PRN

## 2021-12-04 MED ORDER — ROCURONIUM BROMIDE 10 MG/ML (PF) SYRINGE
PREFILLED_SYRINGE | INTRAVENOUS | Status: DC | PRN
  Administered 2021-12-04: 60 mg via INTRAVENOUS
  Administered 2021-12-04: 20 mg via INTRAVENOUS

## 2021-12-04 MED ORDER — AMLODIPINE BESYLATE 5 MG PO TABS
10.0000 mg | ORAL_TABLET | Freq: Every day | ORAL | Status: DC
  Administered 2021-12-05: 10 mg via ORAL
  Filled 2021-12-04: qty 2

## 2021-12-04 MED ORDER — DEXAMETHASONE SODIUM PHOSPHATE 10 MG/ML IJ SOLN
INTRAMUSCULAR | Status: DC | PRN
  Administered 2021-12-04: 10 mg via INTRAVENOUS

## 2021-12-04 MED ORDER — NITROGLYCERIN 0.4 MG SL SUBL: 0.4000 mg | SUBLINGUAL_TABLET | SUBLINGUAL | Status: DC | PRN

## 2021-12-04 MED ORDER — LISINOPRIL 20 MG PO TABS
20.0000 mg | ORAL_TABLET | Freq: Every day | ORAL | Status: DC
  Administered 2021-12-04 – 2021-12-05 (×2): 20 mg via ORAL
  Filled 2021-12-04 (×2): qty 1

## 2021-12-04 MED ORDER — 0.9 % SODIUM CHLORIDE (POUR BTL) OPTIME
TOPICAL | Status: DC | PRN
  Administered 2021-12-04 (×2): 1000 mL

## 2021-12-04 MED ORDER — METHOCARBAMOL 500 MG PO TABS: 500.0000 mg | ORAL_TABLET | Freq: Three times a day (TID) | ORAL | 0 refills | Status: AC | PRN

## 2021-12-04 MED ORDER — TRANEXAMIC ACID-NACL 1000-0.7 MG/100ML-% IV SOLN
1000.0000 mg | INTRAVENOUS | Status: AC
  Administered 2021-12-04: 1000 mg via INTRAVENOUS

## 2021-12-04 MED ORDER — TRANEXAMIC ACID-NACL 1000-0.7 MG/100ML-% IV SOLN
INTRAVENOUS | Status: AC
  Filled 2021-12-04: qty 100

## 2021-12-04 MED ORDER — AMISULPRIDE (ANTIEMETIC) 5 MG/2ML IV SOLN: 10.0000 mg | Freq: Once | INTRAVENOUS | Status: DC | PRN

## 2021-12-04 MED ORDER — LIDOCAINE 2% (20 MG/ML) 5 ML SYRINGE
INTRAMUSCULAR | Status: DC | PRN
  Administered 2021-12-04: 80 mg via INTRAVENOUS

## 2021-12-04 MED ORDER — INSULIN ASPART 100 UNIT/ML IJ SOLN: 0.0000 [IU] | INTRAMUSCULAR | Status: DC | PRN

## 2021-12-04 MED ORDER — HYDROMORPHONE HCL 1 MG/ML IJ SOLN: 0.2500 mg | INTRAMUSCULAR | Status: DC | PRN

## 2021-12-04 MED ORDER — ONDANSETRON HCL 4 MG PO TABS: 4.0000 mg | ORAL_TABLET | Freq: Four times a day (QID) | ORAL | Status: DC | PRN

## 2021-12-04 MED ORDER — ACETAMINOPHEN 325 MG PO TABS
650.0000 mg | ORAL_TABLET | ORAL | Status: DC | PRN
  Administered 2021-12-05: 650 mg via ORAL
  Filled 2021-12-04: qty 2

## 2021-12-04 MED ORDER — THROMBIN 20000 UNITS EX SOLR
CUTANEOUS | Status: DC | PRN
  Administered 2021-12-04: 20 mL via TOPICAL

## 2021-12-04 MED ORDER — ONDANSETRON HCL 4 MG/2ML IJ SOLN: 4.0000 mg | Freq: Four times a day (QID) | INTRAMUSCULAR | Status: DC | PRN

## 2021-12-04 MED ORDER — CHLORHEXIDINE GLUCONATE 0.12 % MT SOLN
OROMUCOSAL | Status: AC
  Administered 2021-12-04: 15 mL via OROMUCOSAL
  Filled 2021-12-04: qty 15

## 2021-12-04 MED ORDER — GABAPENTIN 300 MG PO CAPS
300.0000 mg | ORAL_CAPSULE | Freq: Three times a day (TID) | ORAL | Status: DC
  Administered 2021-12-04 – 2021-12-05 (×2): 300 mg via ORAL
  Filled 2021-12-04 (×2): qty 1

## 2021-12-04 MED ORDER — OXYCODONE-ACETAMINOPHEN 10-325 MG PO TABS: 1.0000 | ORAL_TABLET | Freq: Four times a day (QID) | ORAL | 0 refills | Status: AC | PRN

## 2021-12-04 MED ORDER — PHENOL 1.4 % MT LIQD: 1.0000 | OROMUCOSAL | Status: DC | PRN

## 2021-12-04 MED ORDER — PHENYLEPHRINE 80 MCG/ML (10ML) SYRINGE FOR IV PUSH (FOR BLOOD PRESSURE SUPPORT)
PREFILLED_SYRINGE | INTRAVENOUS | Status: DC | PRN
  Administered 2021-12-04 (×2): 80 ug via INTRAVENOUS

## 2021-12-04 MED ORDER — MENTHOL 3 MG MT LOZG
1.0000 | LOZENGE | OROMUCOSAL | Status: DC | PRN
  Administered 2021-12-04: 3 mg via ORAL
  Filled 2021-12-04: qty 9

## 2021-12-04 MED ORDER — HYDROCHLOROTHIAZIDE 12.5 MG PO TABS
12.5000 mg | ORAL_TABLET | Freq: Every day | ORAL | Status: DC
  Administered 2021-12-04 – 2021-12-05 (×2): 12.5 mg via ORAL
  Filled 2021-12-04 (×3): qty 1

## 2021-12-04 MED ORDER — METFORMIN HCL 500 MG PO TABS
500.0000 mg | ORAL_TABLET | Freq: Two times a day (BID) | ORAL | Status: DC
  Administered 2021-12-05: 500 mg via ORAL
  Filled 2021-12-04: qty 1

## 2021-12-04 MED ORDER — METHYLPREDNISOLONE ACETATE 40 MG/ML IJ SUSP
INTRAMUSCULAR | Status: AC
  Filled 2021-12-04: qty 1

## 2021-12-04 MED ORDER — ORAL CARE MOUTH RINSE: 15.0000 mL | Freq: Once | OROMUCOSAL | Status: AC

## 2021-12-04 MED ORDER — ONDANSETRON HCL 4 MG/2ML IJ SOLN
INTRAMUSCULAR | Status: DC | PRN
  Administered 2021-12-04: 4 mg via INTRAVENOUS

## 2021-12-04 MED ORDER — FENTANYL CITRATE (PF) 250 MCG/5ML IJ SOLN
INTRAMUSCULAR | Status: AC
  Filled 2021-12-04: qty 5

## 2021-12-04 MED ORDER — ACETAMINOPHEN 500 MG PO TABS
ORAL_TABLET | ORAL | Status: AC
  Administered 2021-12-04: 1000 mg via ORAL
  Filled 2021-12-04: qty 2

## 2021-12-04 MED ORDER — PANTOPRAZOLE SODIUM 40 MG PO TBEC
40.0000 mg | DELAYED_RELEASE_TABLET | Freq: Every day | ORAL | Status: DC
  Administered 2021-12-05: 40 mg via ORAL
  Filled 2021-12-04: qty 1

## 2021-12-04 MED ORDER — HYDROMORPHONE HCL 1 MG/ML IJ SOLN
1.0000 mg | INTRAMUSCULAR | Status: DC | PRN
  Administered 2021-12-05: 1 mg via INTRAVENOUS
  Filled 2021-12-04: qty 1

## 2021-12-04 MED ORDER — ISOSORBIDE MONONITRATE ER 60 MG PO TB24
30.0000 mg | ORAL_TABLET | Freq: Every day | ORAL | Status: DC
  Administered 2021-12-05: 30 mg via ORAL
  Filled 2021-12-04: qty 1

## 2021-12-04 MED ORDER — METHOCARBAMOL 500 MG PO TABS
500.0000 mg | ORAL_TABLET | Freq: Four times a day (QID) | ORAL | Status: DC | PRN
  Administered 2021-12-04: 500 mg via ORAL
  Filled 2021-12-04: qty 1

## 2021-12-04 SURGICAL SUPPLY — 56 items
BAG COUNTER SPONGE SURGICOUNT (BAG) ×1 IMPLANT
BNDG COHESIVE 4X5 TAN STRL LF (GAUZE/BANDAGES/DRESSINGS) IMPLANT
BNDG GAUZE DERMACEA FLUFF 4 (GAUZE/BANDAGES/DRESSINGS) ×1 IMPLANT
CANISTER SUCT 3000ML PPV (MISCELLANEOUS) ×1 IMPLANT
CLSR STERI-STRIP ANTIMIC 1/2X4 (GAUZE/BANDAGES/DRESSINGS) ×1 IMPLANT
CORD BIPOLAR FORCEPS 12FT (ELECTRODE) ×1 IMPLANT
COVER SURGICAL LIGHT HANDLE (MISCELLANEOUS) ×1 IMPLANT
DRAIN CHANNEL 15F RND FF W/TCR (WOUND CARE) IMPLANT
DRAPE SURG 17X23 STRL (DRAPES) ×1 IMPLANT
DRAPE U-SHAPE 47X51 STRL (DRAPES) ×1 IMPLANT
DRSG DERMACEA NONADH 3X8 (GAUZE/BANDAGES/DRESSINGS) IMPLANT
DRSG MEPITEL 3X4 ME34 (GAUZE/BANDAGES/DRESSINGS) IMPLANT
DRSG OPSITE POSTOP 3X4 (GAUZE/BANDAGES/DRESSINGS) ×1 IMPLANT
DRSG OPSITE POSTOP 4X6 (GAUZE/BANDAGES/DRESSINGS) IMPLANT
DURAPREP 26ML APPLICATOR (WOUND CARE) ×1 IMPLANT
ELECT BLADE 4.0 EZ CLEAN MEGAD (MISCELLANEOUS)
ELECT CAUTERY BLADE 6.4 (BLADE) ×1 IMPLANT
ELECT PENCIL ROCKER SW 15FT (MISCELLANEOUS) ×1 IMPLANT
ELECT REM PT RETURN 9FT ADLT (ELECTROSURGICAL) ×1
ELECTRODE BLDE 4.0 EZ CLN MEGD (MISCELLANEOUS) IMPLANT
ELECTRODE REM PT RTRN 9FT ADLT (ELECTROSURGICAL) ×1 IMPLANT
EVACUATOR SILICONE 100CC (DRAIN) IMPLANT
GLOVE BIO SURGEON STRL SZ 6.5 (GLOVE) ×1 IMPLANT
GLOVE BIOGEL PI IND STRL 6.5 (GLOVE) ×1 IMPLANT
GLOVE BIOGEL PI IND STRL 8.5 (GLOVE) ×1 IMPLANT
GLOVE SS BIOGEL STRL SZ 8.5 (GLOVE) ×1 IMPLANT
GOWN STRL REUS W/ TWL LRG LVL3 (GOWN DISPOSABLE) ×2 IMPLANT
GOWN STRL REUS W/TWL 2XL LVL3 (GOWN DISPOSABLE) ×1 IMPLANT
GOWN STRL REUS W/TWL LRG LVL3 (GOWN DISPOSABLE) ×2
KIT BASIN OR (CUSTOM PROCEDURE TRAY) ×1 IMPLANT
KIT TURNOVER KIT B (KITS) ×1 IMPLANT
NDL 22X1.5 STRL (OR ONLY) (MISCELLANEOUS) ×1 IMPLANT
NDL SPNL 18GX3.5 QUINCKE PK (NEEDLE) ×2 IMPLANT
NEEDLE 22X1.5 STRL (OR ONLY) (MISCELLANEOUS) ×1 IMPLANT
NEEDLE SPNL 18GX3.5 QUINCKE PK (NEEDLE) ×2 IMPLANT
NS IRRIG 1000ML POUR BTL (IV SOLUTION) ×1 IMPLANT
PACK LAMINECTOMY ORTHO (CUSTOM PROCEDURE TRAY) ×1 IMPLANT
PACK UNIVERSAL I (CUSTOM PROCEDURE TRAY) ×1 IMPLANT
PAD ARMBOARD 7.5X6 YLW CONV (MISCELLANEOUS) ×2 IMPLANT
PATTIES SURGICAL .5 X.5 (GAUZE/BANDAGES/DRESSINGS) ×1 IMPLANT
PATTIES SURGICAL .5 X1 (DISPOSABLE) ×1 IMPLANT
SPONGE SURGIFOAM ABS GEL 100 (HEMOSTASIS) IMPLANT
SPONGE T-LAP 4X18 ~~LOC~~+RFID (SPONGE) ×3 IMPLANT
SURGIFLO W/THROMBIN 8M KIT (HEMOSTASIS) IMPLANT
SUT BONE WAX W31G (SUTURE) ×1 IMPLANT
SUT MNCRL+ AB 3-0 CT1 36 (SUTURE) ×1 IMPLANT
SUT MONOCRYL AB 3-0 CT1 36IN (SUTURE) ×1
SUT VIC AB 1 CT1 18XCR BRD 8 (SUTURE) ×1 IMPLANT
SUT VIC AB 1 CT1 8-18 (SUTURE) ×1
SUT VIC AB 2-0 CT1 18 (SUTURE) ×1 IMPLANT
SYR BULB IRRIG 60ML STRL (SYRINGE) ×1 IMPLANT
SYR CONTROL 10ML LL (SYRINGE) ×1 IMPLANT
TOWEL GREEN STERILE (TOWEL DISPOSABLE) ×1 IMPLANT
TOWEL GREEN STERILE FF (TOWEL DISPOSABLE) ×1 IMPLANT
WATER STERILE IRR 1000ML POUR (IV SOLUTION) ×1 IMPLANT
YANKAUER SUCT BULB TIP NO VENT (SUCTIONS) IMPLANT

## 2021-12-04 NOTE — Brief Op Note (Signed)
12/04/2021  5:00 PM  PATIENT:  Erik Keith  81 y.o. male  PRE-OPERATIVE DIAGNOSIS:  Lumbar spinal stenosis with radiculopathy  POST-OPERATIVE DIAGNOSIS:  Lumbar spinal stenosis with radiculopathy  PROCEDURE:  Procedure(s) with comments: L2-3 DECOMPRESSION (N/A) - 2.5 hrs 3 C-Bed  SURGEON:  Surgeon(s) and Role:    Melina Schools, MD - Primary  PHYSICIAN ASSISTANT:   ASSISTANTS: none   ANESTHESIA:   general  EBL:  100 mL   BLOOD ADMINISTERED:none  DRAINS:  1 JP drain in the back    LOCAL MEDICATIONS USED:  MARCAINE     SPECIMEN:  No Specimen  DISPOSITION OF SPECIMEN:  N/A  COUNTS:  YES  TOURNIQUET:  * No tourniquets in log *  DICTATION: .Dragon Dictation  PLAN OF CARE: Admit to inpatient   PATIENT DISPOSITION:  PACU - hemodynamically stable.

## 2021-12-04 NOTE — Op Note (Signed)
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  After lengthy discussion with the radiologist he indicated that he understood that by counting from the last full complete disc space as L5-S1 that my probe was at L2-3.  This matches the preoperative MRI.  He also suffered 2 skin tears on either upper extremity.  These were dressed with Neosporin and dry dressings.  BRIEF HISTORY: Erik Keith is a 81 y.o. male who presented to my office with complaints of significant back and left radicular thigh pain.  Imaging studies demonstrated foraminal stenosis at L2-3 on the left side with lateral recess stenosis.  As result of the failure to improve with conservative measures we elected to move forward with surgery.  All appropriate risks, benefits, alternatives to surgery were discussed with the patient and consent was obtained.  PROCEDURE DETAILS: Patient was brought into the operating room and was properly positioned on the operating room table.  After induction with general anesthesia the patient was endotracheally intubated.  A timeout was taken to confirm all important data: including patient, procedure, and the level. Teds, SCD's were applied.   Patient was turned prone onto the Wilson frame and all bony prominences well-padded.  The back was prepped and draped in a standard fashion.  2 needles were placed into the back in order to  localize my incision.  I then marked out my L2-3 disc space and infiltrated the area with quarter percent Marcaine with epinephrine.  Midline incision was made and sharp dissection was carried out down to the deep fascia.  The deep fascia was sharply incised and I stripped the paraspinal muscles to expose the spinous process of L2 and L3 as well as the L2-3 facet complex bilaterally.  Penfield 4 was placed underneath the L2 lamina and a second x-ray was taken.  At this point the radiologist contact me and indicated that using CT scans of the abdomen and pelvis as well as a CT scan of the thoracic spine he started his count superiorly and indicated that my probe was at L3-4.  I did indicate that the patient did have transitional anatomy but the last full disc space was considered L5-S1 based on my preoperative MRI.  With that in mind radiologist did indicate he would make an amendment indicating that based on the last full disc space being L5 that the marker was at the appropriate L2-3 level.  A generous L2 left-sided laminotomy was performed and I also took down the inferior portion of the L2 spinous process.  A near complete laminectomy of L2 on the left side was performed.  I then proceeded into the lateral recess.  There was a large osteophyte consistent with what was seen on the preoperative MRI.  With gentle dissection with a Penfield 4 I was able to create a plane between the ligamentum flavum, the bone spur, and the thecal sac.  I then placed a neuro patty to protect the thecal sac.  This  allowed me to resect the osteophyte and complete my lateral recess decompression I then traced the L3 nerve root into the L3 foramen and using my Kerrison rongeurs performed a foraminotomy of L3.  At this point I could palpate and visualize the medial and inferior as well as superior portions of the L3 pedicle confirming had an adequate lateral recess and L3 nerve root decompression.  I then proceeded superiorly until I  was able to visualize the inferior aspect of the L2 pedicle I then identified the L2 nerve root and began tracing this out into the foramen.  Using curved curettes I performed a foraminotomy of L2.  I can now easily pass my nerve hook out the L5 to foramen as well as out the L3 foramen.  There is also no further compromise in the lateral recess.  A final x-ray was taken with progress in the L3 and L2 foramen confirming I had satisfactory decompression.     At this point the epidural veins were isolated and coagulated with bipolar electrocautery.  I irrigated wound copiously normal saline and then placed Floseal to aid in hemostasis.  After final irrigation there was still some bleeding epidural veins which were not amenable to bipolar electrocautery.  I did place a deep drain which was brought out of a separate stab incision so that if bleeding increased the drainage would not cause pressure on the spinal canal.  After final irrigation retractors were removed and the deep fascia was closed with interrupted #1 Vicryl sutures.  A layer 2-0 Vicryl sutures was then placed followed by 3-0 Monocryl for the skin.  Steri-Strips and dry dressing were applied and the patient was ultimately extubated and transferred the PACU without incident.  The end of the case all needle sponge counts were correct.     Melina Schools, MD 12/04/2021 4:50 PM

## 2021-12-04 NOTE — Transfer of Care (Signed)
Immediate Anesthesia Transfer of Care Note  Patient: Hadden Steig Feagan  Procedure(s) Performed: L2-3 DECOMPRESSION  Patient Location: PACU  Anesthesia Type:General  Level of Consciousness: drowsy and patient cooperative  Airway & Oxygen Therapy: Patient Spontanous Breathing  Post-op Assessment: Report given to RN and Post -op Vital signs reviewed and stable  Post vital signs: Reviewed and stable  Last Vitals:  Vitals Value Taken Time  BP 116/60 12/04/21 1715  Temp    Pulse 74 12/04/21 1717  Resp 16 12/04/21 1717  SpO2 93 % 12/04/21 1717  Vitals shown include unvalidated device data.  Last Pain:  Vitals:   12/04/21 1140  PainSc: 5          Complications: No notable events documented.

## 2021-12-04 NOTE — H&P (Signed)
History:   Erik Keith continues to have severe debilitating back buttock and left anterior thigh/groin pain. Despite injection therapy, as well as formalized physical therapy his overall quality of life has continued to deteriorate. He had a L2 SNRB 6 weeks ago which did give some improvement but unfortunately it was only temporary.  He confirms a left-sided facet spur causing L2 and L3 neural compression which would explain his symptoms.  Since he has not had any significant improvement with conservative management we have elected to move forward with an L2-3 decompression for improved overall function and quality of life.  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   23 mm Edwards Inspiris Resilia stented bioprosthetic tissue valve   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   LIMA to LAD, Sequential SVG to OM1-OM2, SVG to PDA, EVH via right thigh   Type 2 diabetes mellitus ([facility]     Allergies  Allergen Reactions   Contrast Media [Iodinated Contrast Media] Hives   Penicillins Hives    Has patient had a PCN reaction causing immediate rash, facial/tongue/throat swelling, SOB or lightheadedness with hypotension: unkn Has patient had a PCN reaction causing severe rash involving mucus membranes or skin necrosis: unkn Has patient had a PCN reaction that required hospitalization: unkn Has patient had a PCN  reaction occurring within the last 10 years: no If all of the above answers are "NO", then Aversa proceed with Cephalosporin use.     No current facility-administered medications on file prior to encounter.   Current Outpatient Medications on File Prior to Encounter  Medication Sig Dispense Refill   amLODipine (NORVASC) 10 MG tablet Take 1 tablet (10 mg total) by mouth daily. 90 tablet 3   aspirin EC 81 MG tablet Take 1 tablet (81 mg total) by mouth daily. Swallow whole. 90 tablet 3   atorvastatin (LIPITOR) 40 MG tablet Take 40 mg by mouth at bedtime.     Cyanocobalamin (B-12) 1000 MCG TABS Take 1,000 mcg by mouth daily.     ferrous sulfate 325 (65 FE) MG EC tablet Take 325 mg by mouth 3 (three) times a week.     glipiZIDE (GLUCOTROL XL) 5 MG 24 hr tablet Take 5 mg by mouth daily with breakfast.     hydrochlorothiazide (MICROZIDE) 12.5 MG capsule Take 1 capsule (12.5 mg total) by mouth daily. 90 capsule 2   HYDROcodone-acetaminophen (NORCO) 10-325 MG tablet Take 1 tablet by mouth in the morning, at noon, and at bedtime.     isosorbide mononitrate (IMDUR) 30 MG 24 hr tablet Take 1 tablet (30 mg total) by mouth daily. 90 tablet 3   lisinopril (ZESTRIL) 20 MG tablet Take 1 tablet (20 mg total) by mouth daily. 90 tablet 3   metFORMIN (GLUCOPHAGE) 500 MG tablet Take 500 mg by mouth 2 (  two) times daily with a meal.     pantoprazole (PROTONIX) 40 MG tablet Take 40 mg by mouth daily.     Polyethylene Glycol 3350 (MIRALAX PO) Take 17 g by mouth daily. WITH COFFEE     Wheat Dextrin (BENEFIBER) POWD Benefiber, Mix 1 tablespoon with a liquid and drink once daily.     zolpidem (AMBIEN) 10 MG tablet Take 10 mg by mouth at bedtime.     acetaminophen (TYLENOL) 500 MG tablet Take 2 tablets (1,000 mg total) by mouth every 6 (six) hours as needed for mild pain or fever. (Patient not taking: Reported on 11/29/2021) 30 tablet 0   atorvastatin (LIPITOR) 20 MG tablet TAKE 1 TABLET BY MOUTH DAILY AT 6 PM. (Patient not  taking: Reported on 11/29/2021) 90 tablet 0   glucose blood (ONETOUCH ULTRA) test strip USE TO CHECK BLOOD SUGAR ONCE A DAY E11.9     nitroGLYCERIN (NITROSTAT) 0.4 MG SL tablet Place 1 tablet (0.4 mg total) under the tongue every 5 (five) minutes as needed for chest pain. 25 tablet 3    Physical Exam: Vitals:   12/04/21 1130  BP: 136/76  Pulse: 63  Resp: 18  Temp: 97.7 F (36.5 C)  SpO2: 94%   Body mass index is 26.92 kg/m. Clinical exam: Erik Keith is a pleasant individual, who appears younger than their stated age.  He is alert and orientated 3.  No shortness of breath, chest pain.  Abdomen is soft and non-tender, negative loss of bowel and bladder control, no rebound tenderness.  Negative: skin lesions abrasions contusions  Peripheral pulses: 2+ peripheral pulses bilaterally. LE compartments are: Soft and nontender.  Gait pattern: Normal  Assistive devices: None  Neuro: Bilateral lower extremity dysesthesias. 5/5 motor strength in the lower extremity. Negative straight leg raise test, no clonus, negative Babinski test. 1+ deep tendon reflexes.  Musculoskeletal: Well-healed surgical scar from previous lumbar decompressive procedures 30+ years ago. Pain with range of motion especially extension and rotation. No SI joint pain.  Imaging: X-rays of the lumbar spine demonstrate mild degenerative lumbar scoliosis with multilevel facet arthrosis.  Lumbar MRI: completed on 09/16/2021: Moderate to severe left foraminal and lateral recess stenosis due to hard disc osteophyte and facet arthrosis at L2/3. Mild foraminal stenosis L3-4. Minimal grade 1 slip at L4-5 producing moderate central and bilateral L5 stenosis but no significant foraminal stenosis.  A/P:Summary: Erik Keith continues to have severe debilitating back buttock and left anterior thigh/groin pain. Despite injection therapy, as well as formalized physical therapy his overall quality of life has continued to deteriorate. He had a  L2 SNRB 6 weeks ago which did give some improvement but unfortunately it was only temporary.  I have gone over his imaging studies and clinical exam. He has multiple areas of degenerative pathology in his lumbar spine. At this point he states that his primary concern is the left anterior thigh/groin pain. Based on his imaging studies this does correlate to the L2-L3 nerve root compression. I think it is reasonable to move forward with a limited decompression of this area.  I have gone over the surgical procedure in great detail including the risks, and benefits. All of his questions were addressed. Risks and benefits of lumbar decompression/discectomy: Infection, bleeding, death, stroke, paralysis, ongoing or worse pain, need for additional surgery, leak of spinal fluid, adjacent segment degeneration requiring additional surgery, post-operative hematoma formation that can result in neurological compromise and the need for urgent/emergent re-operation. Loss in bowel and bladder control. Injury to  major vessels that could result in the need for urgent abdominal surgery to stop bleeding. Risk of deep venous thrombosis (DVT) and the need for additional treatment. Recurrent disc herniation resulting in the need for revision surgery, which could include fusion surgery (utilizing instrumentation such as pedicle screws and intervertebral cages).

## 2021-12-04 NOTE — Discharge Instructions (Signed)
Laminectomy, Care After This sheet gives you information about how to care for yourself after your procedure. Your health care provider Neria also give you more specific instructions. If you have problems or questions, contact your health care provider. What can I expect after the procedure? After the procedure, it is common to have: Some pain around your incision area. Muscle tightening (spasms) across the back.   Follow these instructions at home: Incision care Follow instructions from your health care provider about how to take care of your incision area. Make sure you: Wash your hands with soap and water before and after you apply medicine to the area or change your bandage (dressing). If soap and water are not available, use hand sanitizer. Change your dressing as told by your health care provider. Leave stitches (sutures), skin glue, or adhesive strips in place. These skin closures Smola need to stay in place for 2 weeks or longer. If adhesive strip edges start to loosen and curl up, you Jewkes trim the loose edges. Do not remove adhesive strips completely unless your health care provider tells you to do that.  Check your incision area every day for signs of infection. Check for: More redness, swelling, or pain. More fluid or blood. Warmth. Pus or a bad smell. Medicines Take over-the-counter and prescription medicines only as told by your health care provider. If you were prescribed an antibiotic medicine, use it as told by your health care provider. Do not stop using the antibiotic even if you start to feel better. If needed, call office in 3 days to request refill of pain medications. Bathing Do not take baths, swim, or use a hot tub for 6 weeks, or until your incision has healed completely. If your health care provider approves, you Lehew take showers after your dressing has been removed. Ok to shower in 5 days Activity Return to your normal activities as told by your health care  provider. Ask your health care provider what activities are safe for you. Avoid bending or twisting at your waist. Always bend at your knees. Do not sit for more than 20-30 minutes at a time. Lie down or walk between periods of sitting. Do not lift anything that is heavier than 10 lb (4.5 kg) or the limit that your health care provider tells you, until he or she says that it is safe. Do not drive for 2 weeks after your procedure or for as long as your health care provider tells you.  Do not drive or use heavy machinery while taking prescription pain medicine. General instructions To prevent or treat constipation while you are taking prescription pain medicine, your health care provider Burrill recommend that you: Drink enough fluid to keep your urine clear or pale yellow. Take over-the-counter or prescription medicines. Eat foods that are high in fiber, such as fresh fruits and vegetables, whole grains, and beans. Limit foods that are high in fat and processed sugars, such as fried and sweet foods. Do breathing exercises as told. Keep all follow-up visits as told by your health care provider. This is important. Contact a health care provider if: You have more redness, swelling, or pain around your incision area. Your incision feels warm to the touch. You are not able to return to activities or do exercises as told by your health care provider. Get help right away if: You have: More fluid or blood coming from your incision area. Pus or a bad smell coming from your incision area. Chills or a fever.   Episodes of dizziness or fainting while standing. You develop a rash. You develop shortness of breath or you have difficulty breathing. You cannot control when you urinate or have a bowel movement. You become weak. You are not able to use your legs. Summary After the procedure, it is common to have some pain around your incision area. You Basara also have muscle tightening (spasms) across the  back. Follow instructions from your health care provider about how to care for your incision. Do not lift anything that is heavier than 10 lb (4.5 kg) or the limit that your health care provider tells you, until he or she says that it is safe. Contact your health care provider if you have more redness, swelling, or pain around your incision area or if your incision feels warm to the touch. These can be signs of infection. This information is not intended to replace advice given to you by your health care provider. Make sure you discuss any questions you have with your health care provider. Refer to this sheet in the next few weeks. These instructions provide you with information about caring for yourself after your procedure. Your health care provider Enderle also give you more specific instructions. Your treatment has been planned according to current medical practices, but problems sometimes occur. Call your health care provider if you have any problems or questions after your procedure. What can I expect after the procedure? It is common to have pain for the first few days after the procedure. Some people continue to have mild pain even after making a full recovery. Follow these instructions at home: Medicine Take medicines only as directed by your health care provider. Avoid taking over-the-counter pain medicines unless your health care provider tells you otherwise. These medicines interfere with the development and growth of new bone cells. If you were prescribed a narcotic pain medicine, take it exactly as told by your health care provider. Do not drink alcohol while on the medicine. Do not drive while on the medicine. Injury care Care for your back brace as told by your health care provider. If directed, apply ice to the injured area: Put ice in a plastic bag. Place a towel between your skin and the bag. Leave the ice on for 20 minutes, 2-3 times a day. Activity Perform physical therapy  exercises as told by your health care provider. Exercise regularly. Start by taking short walks. Slowly increase your activity level over time. Gentle exercise helps to ease pain. Sit, stand, walk, turn in bed, and reposition yourself as told by your health care provider. This will help to keep your spine in proper alignment. Avoid bending and twisting your body. Avoid doing strenuous household chores, such as vacuuming. Do not lift anything that is heavier than 10 lb (4.5 kg). Other Instructions Keep all follow-up visits as directed by your health care provider. This is important. Do not use any tobacco products, including cigarettes, chewing tobacco, or electronic cigarettes. If you need help quitting, ask your health care provider. Nicotine affects the way bones heal. Contact a health care provider if: Your pain gets worse. You have a fever. You have redness, swelling, or pain at the site of your incision. You have fluid, blood, or pus coming from your incision. You have numbness, tingling, or weakness in any part of your body. Get help right away if: Your incision feels swollen and tender, and the surrounding area looks like a lump. The lump Nestle be red or bluish in color. You   cannot move any part of your body (paralysis). You cannot control your bladder or bowels. 

## 2021-12-04 NOTE — Anesthesia Procedure Notes (Signed)
Procedure Name: Intubation Date/Time: 12/04/2021 2:46 PM  Performed by: Thelma Comp, CRNAPre-anesthesia Checklist: Patient identified, Emergency Drugs available, Suction available and Patient being monitored Patient Re-evaluated:Patient Re-evaluated prior to induction Oxygen Delivery Method: Circle System Utilized Preoxygenation: Pre-oxygenation with 100% oxygen Induction Type: IV induction Ventilation: Mask ventilation without difficulty Laryngoscope Size: Mac and 4 Grade View: Grade I Tube type: Oral Tube size: 7.5 mm Number of attempts: 1 Airway Equipment and Method: Stylet Placement Confirmation: ETT inserted through vocal cords under direct vision, positive ETCO2 and breath sounds checked- equal and bilateral Secured at: 22 cm Tube secured with: Tape Dental Injury: Teeth and Oropharynx as per pre-operative assessment

## 2021-12-04 NOTE — Progress Notes (Signed)
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  History includes former smoker (quit 01/02/00), aortic stenosis (s/p CABG/AVR using 23 mm Edwards Inspiris Resilia Stented Bovine Pericardial Tissue Valve 10/03/17), CAD (s/p LAD stent 12/2006, DES RCA 07/2007; lateral STEMI in setting unexpected death of son, s/p PTCA CX 08-Oct-2017-->emergent CABG/AVR 10/03/17: LIMA-LAD, SVG-PDA, SVG-OM1-OM2 & AVR; post-op PAF), HTN, HLD, DM2, iron deficiency anemia, GERD, CKD (stage III), osteoarthritis, pulmonary nodules (2 RUL nodules 10/08/2017 CTA as part of AVR vs TAVR w/u; RML and lingular micronodularity 07/22/17 CT abd/pelvis), spinal surgery, contrast allergy.  He had telephonic preoperative cardiology evaluation by Nicholes Rough, PA-C on 11/14/21. She wrote, "Mr. Miracle's perioperative risk of a major cardiac event is 6.6% according to the Revised Cardiac Risk Index (RCRI).  Therefore, he is at high risk for perioperative complications.   His functional capacity is good at 6.36 METs according to the Duke Activity Status Index (DASI). Recommendations: According to ACC/AHA guidelines, no further cardiovascular testing needed.  The patient Delafuente proceed to surgery at acceptable risk.   Antiplatelet and/or Anticoagulation Recommendations: Aspirin can be held for 5-7 days prior to his surgery.  Please resume Aspirin post operatively when it is felt to be safe from a bleeding standpoint." Not on b-blocker due to bradycardia.  Anesthesia team to evaluate on the day of surgery.    VS: BP (!) 141/73   Pulse (!) 47   Temp (!) 36.4 C   Resp 17   Ht '5\' 11"'$  (1.803 m)   Wt 87.5 kg   SpO2 97%   BMI 26.92 kg/m     PROVIDERS: Nicoletta Dress, MD is PCP  - Lauree Chandler, MD is cardiologist. Prior he had seen Mathis Bud, MD with [facility] (last visit 05/16/17) and prior to that Shirlee More, MD (last 04/19/16) when he was with [facility].   Lavera Guise, MD is HEM-ONC. Last visit 02/03/21 for MGUS without signs of progressive. One year follow-up recommended.    LABS: Preoperative labs noted. Cr 1.67, previously 1.6 on 01/27/21. (all labs ordered are listed, but only abnormal results are displayed)  Labs Reviewed  GLUCOSE, CAPILLARY - Abnormal; Notable for the following components:      Result Value   Glucose-Capillary 191 (*)    All other components within normal limits  BASIC METABOLIC PANEL - Abnormal; Notable for the following components:   Glucose, Bld 155 (*)    BUN 29 (*)    Creatinine, Ser 1.67 (*)    GFR, Estimated 41 (*)    All other components within normal limits  CBC - Abnormal; Notable for the following components:   Platelets 145 (*)    All other components within normal limits  SURGICAL PCR SCREEN    PFTs 10-08-17: FVC 4.02 (94%), FEV1 2.53 (82%), DLCO unc 22.97 (68%).    EKG: 05/24/21 ([facility]): NSR. T wave abnormality, consider inferior ischemia.   CV: Echo 06/19/21: IMPRESSIONS   1. Left ventricular ejection fraction, by estimation, is 60 to 65%. The  left ventricle has normal function. The left ventricle has no regional  wall motion abnormalities. Left ventricular diastolic parameters are  indeterminate.   2. Right ventricular systolic function is normal. The right ventricular  size is normal. There is normal pulmonary artery systolic pressure. The  estimated right ventricular systolic pressure is 81.1 mmHg.   3. The mitral valve is normal in structure. Mild mitral valve  regurgitation. No evidence of mitral stenosis.   4. The aortic valve has been repaired/replaced. Aortic valve  regurgitation is not  visualized. No aortic stenosis is present. There is a  23 mm Edwards Inspiris Resilia Stented Bovine Pericardial Tissue valve  present in the aortic position. Procedure  Date: 10/03/2017. Echo findings are consistent with normal structure and  function of the aortic valve prosthesis. Aortic valve mean gradient  measures 8.4 mmHg. Aortic valve Vmax measures 1.97 m/s.   5. The inferior vena cava is normal in size with greater than 50%  respiratory variability, suggesting right atrial pressure of 3 mmHg.    Cardiac cath 10/02/17 (Pre-AVR/CABG): Ost LAD to Prox LAD lesion is 30% stenosed. Mid LAD-1 lesion is 60% stenosed. Mid LAD-2 lesion is 70% stenosed. Prox Cx to Mid Cx lesion is 100% stenosed. Balloon angioplasty was performed using a BALLOON SAPPHIRE 2.0X12. Post intervention, there is a 25% residual stenosis. Ost Cx to Prox Cx lesion is 80% stenosed. Mid RCA to Dist RCA lesion is 60% stenosed. Mid RCA lesion is 70% stenosed. RPDA lesion is 99% stenosed. Lat 2nd Mrg lesion is 90% stenosed. 2nd Mrg lesion is 80% stenosed. S/p AVR/CABG 10/03/17.     US Carotid 10/02/17: Final Interpretation:  Right Carotid: Velocities in the right ICA are consistent with a 1-39%  stenosis.  Left Carotid: Velocities in the left ICA are consistent with a 1-39%  stenosis.  Vertebrals: Bilateral vertebral arteries demonstrate antegrade flow.    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   23 mm Edwards Inspiris Resilia stented bioprosthetic tissue valve   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   LIMA to LAD, Sequential SVG to OM1-OM2, SVG to PDA, EVH via right thigh   Type 2 diabetes mellitus ([place]     Past Surgical History:  Procedure Laterality Date   AORTIC VALVE REPLACEMENT N/A 10/03/2017   Procedure: AORTIC VALVE REPLACEMENT (AVR);  Surgeon: ORexene Alberts MD;  [place]  Service: Open Heart Surgery;  Laterality: N/A;   APPENDECTOMY     BACK SURGERY  x3   CORONARY ARTERY BYPASS GRAFT N/A 10/03/2017   Procedure: CORONARY ARTERY BYPASS GRAFTING (CABG)x three, USING LEFT INTERNAL MAMMARY ARTERY AND RIGHT GREATER SAPHENOUS VEIN HARVESTED ENDOSCOPICALLY;  Surgeon: ORexene Alberts MD;  [place] [place]  Service: Open Heart Surgery;  Laterality: N/A;   CORONARY/GRAFT ACUTE MI REVASCULARIZATION N/A 10/02/2017   Procedure: Coronary/Graft Acute MI Revascularization;  Surgeon: VJettie Booze MD;  [facility];  Service: Cardiovascular;  Laterality: N/A;   LEFT HEART CATH AND CORONARY ANGIOGRAPHY N/A 10/02/2017   Procedure: LEFT HEART CATH AND CORONARY ANGIOGRAPHY;  Surgeon: Jettie Booze, MD;  Location: [place] [facility];  Service: Cardiovascular;  Laterality: N/A;   RIGHT/LEFT HEART CATH AND CORONARY ANGIOGRAPHY N/A 09/12/2017   Procedure: RIGHT/LEFT HEART CATH AND CORONARY ANGIOGRAPHY;  Surgeon: Burnell Blanks, MD;  Location: [place] [facility];  Service: Cardiovascular;  Laterality: N/A;   TONSILLECTOMY      MEDICATIONS:  acetaminophen (TYLENOL) 500 MG tablet   amLODipine (NORVASC) 10 MG tablet   aspirin EC 81 MG tablet   atorvastatin (LIPITOR) 20 MG tablet   atorvastatin (LIPITOR) 40 MG tablet   Cyanocobalamin (B-12) 1000 MCG TABS   ferrous sulfate 325 (65 FE) MG EC tablet   glipiZIDE (GLUCOTROL XL) 5 MG 24 hr tablet   glucose blood (ONETOUCH  ULTRA) test strip   hydrochlorothiazide (MICROZIDE) 12.5 MG capsule   HYDROcodone-acetaminophen (NORCO) 10-325 MG tablet   isosorbide mononitrate (IMDUR) 30 MG 24 hr tablet   lisinopril (ZESTRIL) 20 MG tablet   metFORMIN (GLUCOPHAGE) 500 MG tablet   nitroGLYCERIN (NITROSTAT) 0.4 MG SL tablet   pantoprazole (PROTONIX) 40 MG tablet   Polyethylene Glycol 3350 (MIRALAX PO)   Wheat Dextrin (BENEFIBER) POWD   zolpidem (AMBIEN) 10 MG tablet   No current facility-administered medications for this encounter.    Myra Gianotti, PA-C Surgical Short Stay/Anesthesiology [facility] Phone [phone] [facility] Phone [phone] 12/04/2021 9:59 AM

## 2021-12-05 ENCOUNTER — Other Ambulatory Visit: Payer: Self-pay

## 2021-12-05 DIAGNOSIS — I2510 Atherosclerotic heart disease of native coronary artery without angina pectoris: Secondary | ICD-10-CM | POA: Diagnosis not present

## 2021-12-05 DIAGNOSIS — Z952 Presence of prosthetic heart valve: Secondary | ICD-10-CM | POA: Diagnosis not present

## 2021-12-05 DIAGNOSIS — M5416 Radiculopathy, lumbar region: Secondary | ICD-10-CM | POA: Diagnosis not present

## 2021-12-05 DIAGNOSIS — Z7982 Long term (current) use of aspirin: Secondary | ICD-10-CM | POA: Diagnosis not present

## 2021-12-05 DIAGNOSIS — M48061 Spinal stenosis, lumbar region without neurogenic claudication: Secondary | ICD-10-CM | POA: Diagnosis not present

## 2021-12-05 DIAGNOSIS — I129 Hypertensive chronic kidney disease with stage 1 through stage 4 chronic kidney disease, or unspecified chronic kidney disease: Secondary | ICD-10-CM | POA: Diagnosis not present

## 2021-12-05 DIAGNOSIS — N189 Chronic kidney disease, unspecified: Secondary | ICD-10-CM | POA: Diagnosis not present

## 2021-12-05 DIAGNOSIS — E1122 Type 2 diabetes mellitus with diabetic chronic kidney disease: Secondary | ICD-10-CM | POA: Diagnosis not present

## 2021-12-05 DIAGNOSIS — Z951 Presence of aortocoronary bypass graft: Secondary | ICD-10-CM | POA: Diagnosis not present

## 2021-12-05 LAB — GLUCOSE, CAPILLARY: Glucose-Capillary: 153 mg/dL — ABNORMAL HIGH (ref 70–99)

## 2021-12-05 NOTE — Plan of Care (Signed)
Pt and wife given D/C instructions with verbal understanding. Rx's were given to the Pt. Pt's incision is clean and dry with no sign of infection. Pt's IV was removed prior to D/C. Pt D/C'd home via wheelchair per MD order. Pt is stable @ D/C and has no other needs at this time. Holli Humbles, RN

## 2021-12-05 NOTE — Care Management CC44 (Signed)
Condition Code 44 Documentation Completed  Patient Details  Name: Erik Keith MRN: [id number] Date of Birth: 1940-02-26   Condition Code 44 given:  Yes Patient signature on Condition Code 44 notice:  Yes Documentation of 2 MD's agreement:  Yes Code 44 added to claim:  Yes    Pollie Friar, RN 12/05/2021, 9:55 AM

## 2021-12-05 NOTE — TOC Transition Note (Signed)
Transition of Care [facility]) - CM/SW Discharge Note   Patient Details  Name: Erik Keith MRN: [id number] Date of Birth: 1940/09/13  Transition of Care [facility]) CM/SW Contact:  Pollie Friar, RN Phone Number: 12/05/2021, 10:09 AM   Clinical Narrative:    Pt is discharging home with home health services through [facility]. Pt provided choice through [email or web address].  Pts spouse to provide supervision at home and transportation to home.   Final next level of care: Home w Home Health Services Barriers to Discharge: No Barriers Identified   Patient Goals and CMS Choice   CMS Medicare.gov Compare Post Acute Care list provided to:: Patient Choice offered to / list presented to : Patient  Discharge Placement                       Discharge Plan and Services                          HH Arranged: PT [facility] Agency: [place] Date [place]: 12/05/21   Representative spoke with at [place]: [place] ([place]) Interventions     Readmission Risk Interventions     No data to display

## 2021-12-05 NOTE — Discharge Summary (Signed)
Patient ID: Mykael Batz [place]quette MRN: [id number] DOB/AGE: 07-Feb-1940 81 y.o.  Admit date: 12/04/2021 Discharge date: 12/05/2021  Admission Diagnoses:  Principal Problem:   Lumbar spinal stenosis   Discharge Diagnoses:  Principal Problem:   Lumbar spinal stenosis  status post Procedure(s): L2-3 DECOMPRESSION  Past Medical History:  Diagnosis Date   Aortic valve stenosis, severe    CAD S/P percutaneous coronary angioplasty    12-2006  STENT TO LAD;  07-2007 DEStenting TO RCA    CKD (chronic kidney disease)    GERD (gastroesophageal reflux disease)    Hypertension    Incidental pulmonary nodule, less than or equal to 51m 10/02/2017   Ground glass opacities RUL seen on CTA   Iron deficiency anemia    MGUS (monoclonal gammopathy of unknown significance)    Mixed hyperlipidemia    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 male who was admitted 12/04/2021 for operative treatment of Lumbar spinal stenosis. Patient failed conservative treatments (please see the history and physical for the specifics) and had severe unremitting pain that affects sleep, daily activities and work/hobbies. After pre-op clearance, the patient was taken to the operating room on 12/04/2021 and underwent  Procedure(s): L2-3 DECOMPRESSION.    Patient was given perioperative antibiotics:  Anti-infectives (From admission, onward)    Start     Dose/Rate Route Frequency Ordered Stop   12/04/21 1915  ceFAZolin (ANCEF) IVPB 1  g/50 mL premix        1 g 100 mL/hr over 30 Minutes Intravenous Every 8 hours 12/04/21 1825 12/05/21 0351        Patient was given sequential compression devices and early ambulation to prevent DVT.   Patient benefited maximally from hospital stay and there were no complications. At the time of discharge, the patient was urinating/moving their bowels without difficulty, tolerating a regular diet, pain is controlled with oral pain medications and they have been cleared by PT/OT.   Recent vital signs: Patient Vitals for the past 24 hrs:  BP Temp Temp src Pulse Resp SpO2 Height Weight  12/05/21 0357 (!) 107/59 97.9 F (36.6 C) Oral 76 18 95 % -- --  12/04/21 2325 (!) 140/72 97.8 F (36.6 C) Oral 79 18 94 % -- --  12/04/21 2006 129/67 98 F (36.7 C) Oral 74 18 94 % -- --  12/04/21 1830 135/75 97.9 F (36.6 C) Oral 82 18 92 % -- --  12/04/21 1800 122/63 -- -- 73 19 92 % -- --  12/04/21 1745 105/64 -- -- 70 12 91 % -- --  12/04/21 1730 117/64 -- -- 72 13 91 % -- --  12/04/21 1715 116/60 (!) 97.4 F (36.3 C) -- 75 11 95 % -- --  12/04/21 1130 136/76 97.7 F (36.5 C) -- 63 18 94 % '5\' 11"'$  (1.803 m) 87.5 kg     Recent laboratory studies: No results for input(s): "WBC", "HGB", "HCT", "PLT", "NA", "K", "CL", "CO2", "BUN", "CREATININE", "GLUCOSE", "INR", "CALCIUM"  in the last 72 hours.  Invalid input(s): "PT", "2"   Discharge Medications:   Allergies as of 12/05/2021       Reactions   Contrast Media [iodinated Contrast Media] Hives   Penicillins Hives   Has patient had a PCN reaction causing immediate rash, facial/tongue/throat swelling, SOB or lightheadedness with hypotension: unkn Has patient had a PCN reaction causing severe rash involving mucus membranes or skin necrosis: unkn Has patient had a PCN reaction that required hospitalization: unkn Has patient had a PCN reaction occurring within the last 10 years: no If all of the above answers are "NO", then [place] proceed with  Cephalosporin use.        Medication List     STOP taking these medications    acetaminophen 500 MG tablet Commonly known as: TYLENOL   aspirin EC 81 MG tablet   HYDROcodone-acetaminophen 10-325 MG tablet Commonly known as: NORCO   zolpidem 10 MG tablet Commonly known as: AMBIEN       TAKE these medications    amLODipine 10 MG tablet Commonly known as: NORVASC Take 1 tablet (10 mg total) by mouth daily.   atorvastatin 40 MG tablet Commonly known as: LIPITOR Take 40 mg by mouth at bedtime. What changed: Another medication with the same name was removed. Continue taking this medication, and follow the directions you see here.   B-12 1000 MCG Tabs Take 1,000 mcg by mouth daily.   Benefiber Powd Benefiber, Mix 1 tablespoon with a liquid and drink once daily.   ferrous sulfate 325 (65 FE) MG EC tablet Take 325 mg by mouth 3 (three) times a week.   glipiZIDE 5 MG 24 hr tablet Commonly known as: GLUCOTROL XL Take 5 mg by mouth daily with breakfast.   hydrochlorothiazide 12.5 MG capsule Commonly known as: MICROZIDE Take 1 capsule (12.5 mg total) by mouth daily.   isosorbide mononitrate 30 MG 24 hr tablet Commonly known as: IMDUR Take 1 tablet (30 mg total) by mouth daily.   lisinopril 20 MG tablet Commonly known as: ZESTRIL Take 1 tablet (20 mg total) by mouth daily.   metFORMIN 500 MG tablet Commonly known as: GLUCOPHAGE Take 500 mg by mouth 2 (two) times daily with a meal.   methocarbamol 500 MG tablet Commonly known as: ROBAXIN Take 1 tablet (500 mg total) by mouth every 8 (eight) hours as needed for up to 5 days for muscle spasms.   MIRALAX PO Take 17 g by mouth daily. WITH COFFEE   nitroGLYCERIN 0.4 MG SL tablet Commonly known as: NITROSTAT Place 1 tablet (0.4 mg total) under the tongue every 5 (five) minutes as needed for chest pain.   ondansetron 4 MG tablet Commonly known as: Zofran Take 1 tablet (4 mg total) by mouth every 8 (eight) hours  as needed for nausea or vomiting.   OneTouch Ultra test strip Generic drug: glucose blood USE TO CHECK BLOOD SUGAR ONCE A DAY E11.9   oxyCODONE-acetaminophen 10-325 MG tablet Commonly known as: Percocet Take 1 tablet by mouth every 6 (six) hours as needed for up to 5 days for pain.   pantoprazole 40 MG tablet Commonly known as: PROTONIX Take 40 mg by mouth daily.        Diagnostic Studies: DG Lumbar Spine 1 View  Result Date: 12/04/2021 CLINICAL DATA:  Repeat lateral view of the lumbar spine from the operating room for vertebral body numbering. EXAM: LUMBAR SPINE - 1 VIEW COMPARISON:  Lateral views of the lumbar spine 12/04/2021  earlier same day, CT chest, abdomen, and pelvis 10/02/2017, MRI lumbar spine 02/19/2018 FINDINGS: Single lateral view of the lumbar spine 12/04/2021 at 1612 hours. Utilizing the nomenclature of the last well-formed disc space as "L5-S1," and the last square to rectangle-shaped vertebral body as "L5," there is surgical instrumentation from posterior approach pointing to the L2-3 disc space and the mid to inferior aspect of the L3 vertebral body. IMPRESSION: Surgical instrumentation points to the L2-3 disc space and mid to inferior L3 vertebral body, as above. Critical Value/emergent results were called by telephone at the time of interpretation on 12/04/2021 at 4:36 pm to provider [facility] , who verbally acknowledged these results. Electronically Signed   By: Yvonne Kendall M.D.   On: 12/04/2021 16:36   DG Lumbar Spine 2-3 Views  Result Date: 12/04/2021 CLINICAL DATA:  Active surgery.  Intraoperative localization. EXAM: LUMBAR SPINE - 2-3 VIEW COMPARISON:  Lumbar spine radiographs 02/09/2019, CT chest, abdomen, and pelvis 10/02/2017; MRI lumbar spine 02/19/2018 FINDINGS: Correlating with prior CT chest, abdomen, and pelvis 10/02/2017, there are 12 rib-bearing thoracic type vertebral bodies. Distal to this, there is transitional lumbosacral anatomy. The first non  rib-bearing vertebral body is considered T13. Discussing this case with Dr. Melina Schools in the operating room, the next 5 vertebral bodies are considered L1 through L5. The last well-defined disc space is considered L5-S1. On the first lateral image, a metallic needle from posterior approach overlies the posterior L3 spinous process and points to the L3-4 disc space. A second posterior approach needle overlies the posterior L4 spinous process and points to the L4-5 disc space. On the second image a surgical instrument points from posterior approach towards the L3 vertebral body. On the third image the same surgical instrument is directed towards the L2-3 disc space. IMPRESSION: 1. Transitional lumbosacral anatomy. Correlating with prior CT chest, abdomen, and pelvis, the first non rib-bearing vertebral body is considered T13. Distal to this, the next 5 vertebral bodies are considered L1 through L5. This is the numbering nomenclature discussed with and favored by the ordering surgeon Dr. Melina Schools. 2. By this numbering nomenclature, on the final image the surgical instrument points towards the L2-3 disc space. Electronically Signed   By: Yvonne Kendall M.D.   On: 12/04/2021 15:52    Discharge Instructions     Incentive spirometry RT   Complete by: As directed         Follow-up Information     Melina Schools, MD Follow up in 2 week(s).   Specialty: Orthopedic Surgery Why: If symptoms worsen, For wound re-check, For suture removal Contact information: [address] [place] [address] [phone]                 Discharge Plan:  discharge to home  Disposition: Patient is doing quite well status post lumbar decompression for spinal stenosis with lumbar radiculopathy.  Patient had a drain placed and has had minimal output since surgery.  He is voiding spontaneously with positive flatus.  He has been ambulating and notes significant reduction in his radicular leg  pain.  At this point time he will be evaluated by physical and Occupational Therapy prior to his discharge to home.  He will follow-up with me in 2 weeks for wound evaluation.  All appropriate instructions and medications have been provided.    Signed: Dahlia Bailiff for Dr. Melina Schools [facility] [phone] 12/05/2021, 7:57 AM

## 2021-12-05 NOTE — Evaluation (Signed)
Physical Therapy Evaluation Patient Details Name: Erik Keith MRN: [id number] DOB: 24-Albro-1942 Today's Date: 12/05/2021  History of Present Illness  Erik Keith is a 80 yo male who underwent decompression of L2-3 12/4. PMHx: CAD, GERD, HTN, HLD, MI, OA, CABG x4   Clinical Impression  Pt admitted with above. Pt mobilizing well however has poor recall of back precautions and is non-compliant. Pt was able to amb with RW and complete stair negotiation. Educated on proper transfer in/out of car. Recommend HHPT for home visit/safety evaluation and to progress to safe mobility without RW. Acute PT to cont to follow.       Recommendations for follow up therapy are one component of a multi-disciplinary discharge planning process, led by the attending physician.  Recommendations [place] be updated based on patient status, additional functional criteria and insurance authorization.  Follow Up Recommendations Home health PT      Assistance Recommended at Discharge Frequent or constant Supervision/Assistance  Patient can return home with the following  A little help with walking and/or transfers;A little help with bathing/dressing/bathroom;Assist for transportation;Help with stairs or ramp for entrance    Equipment Recommendations  (shower seat, informed family they need to purchase on own)  Recommendations for Other Services       Functional Status Assessment Patient has had a recent decline in their functional status and demonstrates the ability to make significant improvements in function in a reasonable and predictable amount of time.     Precautions / Restrictions Precautions Precautions: Fall;Back Precaution Booklet Issued: Yes (comment) Precaution Comments: pt with recall of 1/3, pt re-educated stating "oh you don't have to worry about me. Im not going to bend, or twist, or lift." However pt then twisiting to get in/out of the bed Required Braces or Orthoses: Spinal Brace Spinal  Brace: Lumbar corset (educated on how to don/doff) Restrictions Weight Bearing Restrictions: No      Mobility  Bed Mobility Overal bed mobility: Needs Assistance Bed Mobility: Rolling, Sidelying to Sit Rolling: Supervision Sidelying to sit: Supervision       General bed mobility comments: cues for log roll, HOB elevated    Transfers Overall transfer level: Needs assistance Equipment used: Rolling walker (2 wheels) Transfers: Sit to/from Stand Sit to Stand: Min guard           General transfer comment: verbal cues for hand placement    Ambulation/Gait Ambulation/Gait assistance: Min guard Gait Distance (Feet): 200 Feet Assistive device: Rolling walker (2 wheels) Gait Pattern/deviations: Step-through pattern, Decreased stride length Gait velocity: decreased Gait velocity interpretation: <1.31 ft/sec, indicative of household ambulator   General Gait Details: verbal cues to stay in walker, educated on how to measure appropriate walker height for his walker at home (wrist at hand rests when in standing)  Stairs            Wheelchair Mobility    Modified Rankin (Stroke Patients Only)       Balance Overall balance assessment: Needs assistance Sitting-balance support: No upper extremity supported, Feet supported Sitting balance-Leahy Scale: Good     Standing balance support: Single extremity supported, During functional activity Standing balance-Leahy Scale: Fair                               Pertinent Vitals/Pain Pain Assessment Pain Assessment: 0-10 Pain Score: 6  Pain Location: back Pain Descriptors / Indicators: Discomfort Pain Intervention(s): Monitored during session    Home Living  Family/patient expects to be discharged to:: Private residence Living Arrangements: Spouse/significant other Available Help at Discharge: Family;Available PRN/intermittently;Available 24 hours/day Type of Home: House Home Access: Stairs to  enter Entrance Stairs-Rails: Right Entrance Stairs-Number of Steps: 4   Home Layout: One level Home Equipment: Cane - single point;Rollator (4 wheels);Grab bars - tub/shower;Hand held shower head      Prior Function Prior Level of Function : Independent/Modified Independent;Driving             Mobility Comments: no AD ADLs Comments: indep     Hand Dominance   Dominant Hand: Right    Extremity/Trunk Assessment   Upper Extremity Assessment Upper Extremity Assessment: Overall WFL for tasks assessed    Lower Extremity Assessment Lower Extremity Assessment: Generalized weakness    Cervical / Trunk Assessment Cervical / Trunk Assessment: Back Surgery  Communication   Communication: HOH  Cognition Arousal/Alertness: Awake/alert Behavior During Therapy: WFL for tasks assessed/performed Overall Cognitive Status: Impaired/Different from baseline Area of Impairment: Safety/judgement                         Safety/Judgement: Decreased awareness of deficits, Decreased awareness of safety     General Comments: required frequent cues to maintain back precautions, didn't recall technique OT educated on an hour before        General Comments General comments (skin integrity, edema, etc.): VSS, wife present, dressing intact, no drainage    Exercises     Assessment/Plan    PT Assessment Patient needs continued PT services  PT Problem List Decreased strength;Decreased activity tolerance;Decreased balance;Decreased mobility;Decreased knowledge of use of DME;Decreased safety awareness;Decreased knowledge of precautions       PT Treatment Interventions DME instruction;Gait training;Stair training;Functional mobility training;Therapeutic activities;Therapeutic exercise;Balance training    PT Goals (Current goals can be found in the Care Plan section)  Acute Rehab PT Goals Patient Stated Goal: home PT Goal Formulation: With patient/family Time For Goal  Achievement: 12/19/21 Potential to Achieve Goals: Good    Frequency Min 5X/week     Co-evaluation               AM-PAC PT "6 Clicks" Mobility  Outcome Measure Help needed turning from your back to your side while in a flat bed without using bedrails?: A Little Help needed moving from lying on your back to sitting on the side of a flat bed without using bedrails?: A Little Help needed moving to and from a bed to a chair (including a wheelchair)?: A Little Help needed standing up from a chair using your arms (e.g., wheelchair or bedside chair)?: A Little Help needed to walk in hospital room?: A Little Help needed climbing 3-5 steps with a railing? : A Little 6 Click Score: 18    End of Session Equipment Utilized During Treatment: Gait belt;Back brace Activity Tolerance: Patient tolerated treatment well Patient left: in bed;with call bell/phone within reach;with family/visitor present Nurse Communication: Mobility status PT Visit Diagnosis: Unsteadiness on feet (R26.81);Muscle weakness (generalized) (M62.81);Difficulty in walking, not elsewhere classified (R26.2)    Time: 9381-0175 PT Time Calculation (min) (ACUTE ONLY): 27 min   Charges:   PT Evaluation $PT Eval Moderate Complexity: 1 Mod PT Treatments $Gait Training: 8-22 mins        Kittie Plater, PT, DPT Acute Rehabilitation Services Secure chat preferred Office #: [phone]   Erik Keith 12/05/2021, 10:33 AM

## 2021-12-05 NOTE — Evaluation (Addendum)
Occupational Therapy Evaluation Patient Details Name: Erik Keith MRN: [id number] DOB: Oct 16, 1940 Today's Date: 12/05/2021   History of Present Illness Erik Keith is a 81 yo male who underwent decompression of L2-3 12/4. PMHx: CAD, GERD, HTN, HLD, MI, OA, CABG x4   Clinical Impression   [name] was evaluated s/p the above admission list, he is indep at baseline and lives with his wife who can assist at d/c. Upon evaluation pt had functional limitations due to pain and knowledge of precautions and compensatory techniques.  Overall pt required up to min A for ADLs and min G for mobility with RW. He did benefit from frequent cues to maintain spinal precautions. Recommend d/c to home without OT follow up.      Recommendations for follow up therapy are one component of a multi-disciplinary discharge planning process, led by the attending physician.  Recommendations [facility] be updated based on patient status, additional functional criteria and insurance authorization.   Follow Up Recommendations  No OT follow up     Assistance Recommended at Discharge Intermittent Supervision/Assistance  Patient can return home with the following A little help with walking and/or transfers;A little help with bathing/dressing/bathroom;Assist for transportation;Help with stairs or ramp for entrance;Assistance with cooking/housework    Functional Status Assessment  Patient has had a recent decline in their functional status and demonstrates the ability to make significant improvements in function in a reasonable and predictable amount of time.  Equipment Recommendations  None recommended by OT    Recommendations for Other Services       Precautions / Restrictions Precautions Precautions: Fall;Back Precaution Booklet Issued: Yes (comment) Required Braces or Orthoses: Spinal Brace Spinal Brace: Lumbar corset Restrictions Weight Bearing Restrictions: No      Mobility Bed Mobility Overal bed  mobility: Needs Assistance Bed Mobility: Rolling, Sidelying to Sit Rolling: Supervision Sidelying to sit: Supervision       General bed mobility comments: cues for log roll    Transfers Overall transfer level: Needs assistance Equipment used: Rolling walker (2 wheels) Transfers: Sit to/from Stand Sit to Stand: Min guard                  Balance Overall balance assessment: Needs assistance Sitting-balance support: No upper extremity supported, Feet supported Sitting balance-Leahy Scale: Good     Standing balance support: Single extremity supported, During functional activity Standing balance-Leahy Scale: Fair                             ADL either performed or assessed with clinical judgement   ADL Overall ADL's : Needs assistance/impaired Eating/Feeding: Independent;Sitting   Grooming: Min guard;Standing   Upper Body Bathing: Set up;Sitting   Lower Body Bathing: Min guard   Upper Body Dressing : Set up;Sitting   Lower Body Dressing: Minimal assistance;Sit to/from stand   Toilet Transfer: Min guard;Ambulation   Toileting- Clothing Manipulation and Hygiene: Supervision/safety;Sitting/lateral lean       Functional mobility during ADLs: Min guard General ADL Comments: cues and assist to maintain back precautions     Vision Baseline Vision/History: 0 No visual deficits Vision Assessment?: No apparent visual deficits     Perception Perception Perception Tested?: No   Praxis Praxis Praxis tested?: Not tested    Pertinent Vitals/Pain Pain Assessment Pain Assessment: Faces Faces Pain Scale: Hurts little more Pain Location: back Pain Descriptors / Indicators: Discomfort Pain Intervention(s): Limited activity within patient's tolerance, Monitored during session  Hand Dominance Right   Extremity/Trunk Assessment Upper Extremity Assessment Upper Extremity Assessment: Overall WFL for tasks assessed   Lower Extremity Assessment Lower  Extremity Assessment: Defer to PT evaluation   Cervical / Trunk Assessment Cervical / Trunk Assessment: Back Surgery   Communication     Cognition Arousal/Alertness: Awake/alert Behavior During Therapy: WFL for tasks assessed/performed Overall Cognitive Status: Impaired/Different from baseline Area of Impairment: Safety/judgement                         Safety/Judgement: Decreased awareness of deficits, Decreased awareness of safety     General Comments: required frequent cues to maintain back precautions     General Comments  VSS, wife present    Exercises     Shoulder Instructions      Home Living Family/patient expects to be discharged to:: Private residence Living Arrangements: Spouse/significant other Available Help at Discharge: Family;Available PRN/intermittently;Available 24 hours/day Type of Home: House Home Access: Stairs to enter [employer] of Steps: 4 Entrance Stairs-Rails: Right Home Layout: One level     Bathroom Shower/Tub: Occupational psychologist: Standard     Home Equipment: Cane - single point;Rollator (4 wheels);Grab bars - tub/shower;Hand held shower head          Prior Functioning/Environment Prior Level of Function : Independent/Modified Independent;Driving             Mobility Comments: no AD ADLs Comments: indep        OT Problem List: Decreased range of motion;Decreased activity tolerance;Impaired balance (sitting and/or standing);Decreased knowledge of precautions;Decreased knowledge of use of DME or AE;Decreased safety awareness      OT Treatment/Interventions:      OT Goals(Current goals can be found in the care plan section) Acute Rehab OT Goals Patient Stated Goal: to go home OT Goal Formulation: With patient Time For Goal Achievement: 12/05/21 Potential to Achieve Goals: Good  OT Frequency:      Co-evaluation              AM-PAC OT "6 Clicks" Daily Activity     Outcome  Measure Help from another person eating meals?: None Help from another person taking care of personal grooming?: None Help from another person toileting, which includes using toliet, bedpan, or urinal?: A Little Help from another person bathing (including washing, rinsing, drying)?: A Little Help from another person to put on and taking off regular upper body clothing?: None Help from another person to put on and taking off regular lower body clothing?: A Little 6 Click Score: 21   End of Session Equipment Utilized During Treatment: Rolling walker (2 wheels);Back brace Nurse Communication: Mobility status  Activity Tolerance: Patient tolerated treatment well Patient left: in bed;with call bell/phone within reach;with family/visitor present  OT Visit Diagnosis: Unsteadiness on feet (R26.81);Muscle weakness (generalized) (M62.81)                Time: 1740-8144 OT Time Calculation (min): 20 min Charges:  OT General Charges $OT Visit: 1 Visit OT Evaluation $OT Eval Moderate Complexity: 1 Mod    Jarrad Mclees D Causey 12/05/2021, 9:33 AM

## 2021-12-05 NOTE — Care Management Obs Status (Signed)
MEDICARE OBSERVATION STATUS NOTIFICATION   Patient Details  Name: Erik Keith MRN: [id number] Date of Birth: Jan 09, 1940   Medicare Observation Status Notification Given:  Yes    Pollie Friar, RN 12/05/2021, 9:55 AM

## 2021-12-05 NOTE — Anesthesia Postprocedure Evaluation (Addendum)
Anesthesia Post Note  Patient: Erik Keith  Procedure(s) Performed: L2-3 DECOMPRESSION     Patient location during evaluation: PACU Anesthesia Type: General Level of consciousness: sedated and patient cooperative Pain management: pain level controlled Vital Signs Assessment: post-procedure vital signs reviewed and stable Respiratory status: spontaneous breathing Cardiovascular status: stable Anesthetic complications: no   No notable events documented.  Last Vitals:  Vitals:   12/05/21 0357 12/05/21 0841  BP: (!) 107/59 (!) 127/97  Pulse: 76 68  Resp: 18 18  Temp: 36.6 C   SpO2: 95% 95%    Last Pain:  Vitals:   12/05/21 0357  TempSrc: Oral  PainSc:                  Nolon Nations

## 2021-12-06 ENCOUNTER — Encounter (HOSPITAL_COMMUNITY): Payer: Self-pay | Admitting: Orthopedic Surgery

## 2021-12-06 LAB — HEMOGLOBIN A1C
Hgb A1c MFr Bld: 6.4 % — ABNORMAL HIGH (ref 4.8–5.6)
Mean Plasma Glucose: 137 mg/dL

## 2021-12-08 ENCOUNTER — Telehealth (HOSPITAL_COMMUNITY): Payer: Self-pay | Admitting: Vascular Surgery

## 2021-12-08 NOTE — Progress Notes (Signed)
Anesthesia follow-up: Mr. Erik Keith is s/p Left L2 laminectomy with foraminotomies, Left L2-3 lateral recess decompression, L3 foraminotomy on 12/04/21 by Dr. Melina Schools. He was discharged on 12/05/21. His assigned anesthesiologist was Dr. Nolon Nations. 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 It was unclear who was following this, so Dr. Lissa Keith requested PCP Erik Dress, MD be notified for follow-up purposes. I spoke with [place] who will relay the information to Dr. Delena Bali. I also routed the reports for his review.   Myra Gianotti, PA-C Surgical Short Stay/Anesthesiology [facility] Phone [phone] 12/08/2021 3:47 PM

## 2021-12-15 ENCOUNTER — Telehealth: Payer: Self-pay

## 2021-12-15 NOTE — Patient Outreach (Signed)
  Care Coordination   12/15/2021 Name: Alexandre Faries Hedtke MRN: [id number] DOB: 19-Jun-1940   Care Coordination Outreach Attempts:  An unsuccessful telephone outreach was attempted today to offer the patient information about available care coordination services as a benefit of their health plan.   Placed call to patient and wife answered and states that patient has company. Will plan to call back at another time.   Follow Up Plan:  Additional outreach attempts will be made to offer the patient care coordination information and services.   Encounter Outcome:  No Answer   Care Coordination Interventions:  No, not indicated    Tomasa Rand, RN, BSN, [facility] [employer] [phone]

## 2021-12-22 ENCOUNTER — Telehealth: Payer: Self-pay

## 2021-12-22 NOTE — Patient Outreach (Signed)
  Care Coordination   Initial Visit Note   12/22/2021 Name: Erik Keith Picking MRN: [id number] DOB: December 22, 1940  Erik Keith is a 81 y.o. year old male who sees Nicoletta Dress, MD for primary care. I spoke with  Erik Keith by phone today.  What matters to the patients health and wellness today?  Spoke with patient today and offer [facility] care coordination program. Patient reports that he is doing well. Reviewed his recent surgery. Reports that he has good family support, reports he has already followed up with [facility]. Denies any needs at this time.     SDOH assessments and interventions completed:  No     Care Coordination Interventions:  No, not indicated   Follow up plan: No further intervention required.   Encounter Outcome:  No Answer   Tomasa Rand, RN, BSN, CEN [place] Coordinator [phone]

## 2022-01-12 DIAGNOSIS — M6281 Muscle weakness (generalized): Secondary | ICD-10-CM | POA: Diagnosis not present

## 2022-01-12 DIAGNOSIS — M545 Low back pain, unspecified: Secondary | ICD-10-CM | POA: Diagnosis not present

## 2022-01-19 DIAGNOSIS — M6281 Muscle weakness (generalized): Secondary | ICD-10-CM | POA: Diagnosis not present

## 2022-01-19 DIAGNOSIS — M545 Low back pain, unspecified: Secondary | ICD-10-CM | POA: Diagnosis not present

## 2022-01-22 DIAGNOSIS — E1169 Type 2 diabetes mellitus with other specified complication: Secondary | ICD-10-CM | POA: Diagnosis not present

## 2022-01-22 DIAGNOSIS — D509 Iron deficiency anemia, unspecified: Secondary | ICD-10-CM | POA: Diagnosis not present

## 2022-01-22 DIAGNOSIS — F5104 Psychophysiologic insomnia: Secondary | ICD-10-CM | POA: Diagnosis not present

## 2022-01-22 DIAGNOSIS — I2510 Atherosclerotic heart disease of native coronary artery without angina pectoris: Secondary | ICD-10-CM | POA: Diagnosis not present

## 2022-01-22 DIAGNOSIS — I10 Essential (primary) hypertension: Secondary | ICD-10-CM | POA: Diagnosis not present

## 2022-01-22 DIAGNOSIS — E785 Hyperlipidemia, unspecified: Secondary | ICD-10-CM | POA: Diagnosis not present

## 2022-01-24 DIAGNOSIS — M6281 Muscle weakness (generalized): Secondary | ICD-10-CM | POA: Diagnosis not present

## 2022-01-24 DIAGNOSIS — M545 Low back pain, unspecified: Secondary | ICD-10-CM | POA: Diagnosis not present

## 2022-01-30 ENCOUNTER — Inpatient Hospital Stay: Payer: Medicare HMO | Attending: Oncology

## 2022-01-30 DIAGNOSIS — D472 Monoclonal gammopathy: Secondary | ICD-10-CM | POA: Diagnosis not present

## 2022-01-30 LAB — CMP (CANCER CENTER ONLY)
ALT: 13 U/L (ref 0–44)
AST: 17 U/L (ref 15–41)
Albumin: 4.4 g/dL (ref 3.5–5.0)
Alkaline Phosphatase: 65 U/L (ref 38–126)
Anion gap: 10 (ref 5–15)
BUN: 36 mg/dL — ABNORMAL HIGH (ref 8–23)
CO2: 26 mmol/L (ref 22–32)
Calcium: 9.5 mg/dL (ref 8.9–10.3)
Chloride: 102 mmol/L (ref 98–111)
Creatinine: 1.92 mg/dL — ABNORMAL HIGH (ref 0.61–1.24)
GFR, Estimated: 34 mL/min — ABNORMAL LOW (ref >60)
Glucose, Bld: 188 mg/dL — ABNORMAL HIGH (ref 70–99)
Potassium: 3.7 mmol/L (ref 3.5–5.1)
Sodium: 138 mmol/L (ref 135–145)
Total Bilirubin: 0.8 mg/dL (ref 0.3–1.2)
Total Protein: 8.2 g/dL — ABNORMAL HIGH (ref 6.5–8.1)

## 2022-01-30 LAB — CBC WITH DIFFERENTIAL (CANCER CENTER ONLY)
Abs Immature Granulocytes: 0.02 10*3/uL (ref 0.00–0.07)
Basophils Absolute: 0 10*3/uL (ref 0.0–0.1)
Basophils Relative: 0 %
Eosinophils Absolute: 0.1 10*3/uL (ref 0.0–0.5)
Eosinophils Relative: 1 %
HCT: 44.3 % (ref 39.0–52.0)
Hemoglobin: 14.7 g/dL (ref 13.0–17.0)
Immature Granulocytes: 0 %
Lymphocytes Relative: 26 %
Lymphs Abs: 2.3 10*3/uL (ref 0.7–4.0)
MCH: 31.6 pg (ref 26.0–34.0)
MCHC: 33.2 g/dL (ref 30.0–36.0)
MCV: 95.3 fL (ref 80.0–100.0)
Monocytes Absolute: 0.7 10*3/uL (ref 0.1–1.0)
Monocytes Relative: 8 %
Neutro Abs: 5.7 10*3/uL (ref 1.7–7.7)
Neutrophils Relative %: 65 %
Platelet Count: 128 10*3/uL — ABNORMAL LOW (ref 150–400)
RBC: 4.65 MIL/uL (ref 4.22–5.81)
RDW: 13.4 % (ref 11.5–15.5)
WBC Count: 8.8 10*3/uL (ref 4.0–10.5)
nRBC: 0 % (ref 0.0–0.2)

## 2022-01-31 DIAGNOSIS — M545 Low back pain, unspecified: Secondary | ICD-10-CM | POA: Diagnosis not present

## 2022-01-31 DIAGNOSIS — M6281 Muscle weakness (generalized): Secondary | ICD-10-CM | POA: Diagnosis not present

## 2022-02-02 LAB — PROTEIN ELECTROPHORESIS, SERUM
A/G Ratio: 1.4 (ref 0.7–1.7)
Albumin ELP: 4.3 g/dL (ref 2.9–4.4)
Alpha-1-Globulin: 0.2 g/dL (ref 0.0–0.4)
Alpha-2-Globulin: 0.8 g/dL (ref 0.4–1.0)
Beta Globulin: 0.8 g/dL (ref 0.7–1.3)
Gamma Globulin: 1.3 g/dL (ref 0.4–1.8)
Globulin, Total: 3.1 g/dL (ref 2.2–3.9)
M-Spike, %: 0.8 g/dL — ABNORMAL HIGH
Total Protein ELP: 7.4 g/dL (ref 6.0–8.5)

## 2022-02-04 NOTE — Progress Notes (Unsigned)
Erik Keith  [address] [place],  [place]  [address] [phone]  Clinic Day:  02/05/2022  Referring physician: Nicoletta Dress, MD  HISTORY OF PRESENT ILLNESS:  The patient is a 82 y.o. 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  Since his last visit, the patient has been doing very well.  He continues to deny having fatigue, bone pain, or other symptoms which concern him for his MGUS potentially progressing into multiple myeloma.  PHYSICAL EXAM:  Blood pressure (!) 151/70, pulse 62, temperature 97.7 F (36.5 C), resp. rate 16, height '5\' 11"'$  (1.803 m), weight 189 lb 4.8 oz (85.9 kg), SpO2 91 %. Wt Readings from Last 3 Encounters:  02/05/22 189 lb 4.8 oz (85.9 kg)  12/04/21 193 lb (87.5 kg)  12/01/21 193 lb (87.5 kg)   Body mass index is 26.4 kg/m. Performance status (ECOG): 1 Physical Exam Constitutional:      Appearance: Normal appearance. He is not ill-appearing.  HENT:     Mouth/Throat:     Mouth: Mucous membranes are moist.     Pharynx: Oropharynx is clear. No oropharyngeal exudate or posterior oropharyngeal erythema.  Cardiovascular:     Rate and Rhythm: Normal rate and regular rhythm.     Heart sounds: No murmur heard.    No friction rub. No gallop.  Pulmonary:     Effort: Pulmonary effort is normal. No respiratory distress.     Breath sounds: Normal breath sounds. No wheezing, rhonchi or rales.  Abdominal:     General: Bowel sounds are normal. There is no distension.     Palpations: Abdomen is soft. There is no mass.     Tenderness: There is no abdominal tenderness.  Musculoskeletal:        General: No swelling.     Right lower leg: No edema.     Left lower leg: No edema.  Lymphadenopathy:     Cervical: No cervical adenopathy.     Upper Body:     Right upper body: No supraclavicular or axillary adenopathy.     Left upper body: No  supraclavicular or axillary adenopathy.     Lower Body: No right inguinal adenopathy. No left inguinal adenopathy.  Skin:    General: Skin is warm.     Coloration: Skin is not jaundiced.     Findings: No lesion or rash.  Neurological:     General: No focal deficit present.     Mental Status: He is alert and oriented to person, place, and time. Mental status is at baseline.  Psychiatric:        Mood and Affect: Mood normal.        Behavior: Behavior normal.        Thought Content: Thought content normal.    LABS:      Latest Ref Rng & Units 01/30/2022    9:31 AM 12/01/2021   10:50 AM 01/27/2021   12:00 AM  CBC  WBC 4.0 - 10.5 K/uL 8.8  9.3  8.7      Hemoglobin 13.0 - 17.0 g/dL 14.7  15.3  16.5      Hematocrit 39.0 - 52.0 % 44.3  45.0  49      Platelets 150 - 400 K/uL 128  145  128         This result is from an external source.      Latest Ref  Rng & Units 01/30/2022    9:31 AM 12/01/2021   10:50 AM 01/27/2021   12:00 AM  CMP  Glucose 70 - 99 mg/dL 188  155    BUN 8 - 23 mg/dL 36  29  23      Creatinine 0.61 - 1.24 mg/dL 1.92  1.67  1.6      Sodium 135 - 145 mmol/L 138  142  138      Potassium 3.5 - 5.1 mmol/L 3.7  4.2  4.5      Chloride 98 - 111 mmol/L 102  105  102      CO2 22 - 32 mmol/L '26  28  27      '$ Calcium 8.9 - 10.3 mg/dL 9.5  10.3  9.9      Total Protein 6.5 - 8.1 g/dL 8.2     Total Bilirubin 0.3 - 1.2 mg/dL 0.8     Alkaline Phos 38 - 126 U/L 65   74      AST 15 - 41 U/L 17   22      ALT 0 - 44 U/L 13   22         This result is from an external source.    Latest Reference Range & Units Most Recent 01/27/21 09:59  M-SPIKE, % Not Observed g/dL 0.8 (H) 01/30/22 09:30 0.9 (H)  (H): Data is abnormally high    ASSESSMENT & PLAN:  Assessment/Plan:  A 82 y.o. 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  In clinic today, I went over all of his recent labs with him, which continue to show no signs of his MGUS progressing into a more aggressive plasma  cell dyscrasia, such as multiple myeloma.  Clinically, he is doing very well.  As that is the case, I will see him back in 1 year to reassess his MGUS. The patient understands all the plans discussed today and is in agreement with them.    Patty Lopezgarcia Macarthur Critchley, MD

## 2022-02-05 ENCOUNTER — Telehealth: Payer: Self-pay | Admitting: Oncology

## 2022-02-05 ENCOUNTER — Other Ambulatory Visit: Payer: Self-pay | Admitting: Oncology

## 2022-02-05 ENCOUNTER — Inpatient Hospital Stay: Payer: Medicare HMO | Attending: Oncology | Admitting: Oncology

## 2022-02-05 VITALS — BP 151/70 | HR 62 | Temp 97.7°F | Resp 16 | Ht 71.0 in | Wt 189.3 lb

## 2022-02-05 DIAGNOSIS — D472 Monoclonal gammopathy: Secondary | ICD-10-CM | POA: Diagnosis not present

## 2022-02-05 NOTE — Telephone Encounter (Signed)
02/05/22 Next appt scheduled and confirmed with patient

## 2022-02-07 DIAGNOSIS — G8929 Other chronic pain: Secondary | ICD-10-CM | POA: Diagnosis not present

## 2022-02-07 DIAGNOSIS — Z79899 Other long term (current) drug therapy: Secondary | ICD-10-CM | POA: Diagnosis not present

## 2022-02-07 DIAGNOSIS — M6281 Muscle weakness (generalized): Secondary | ICD-10-CM | POA: Diagnosis not present

## 2022-02-07 DIAGNOSIS — M545 Low back pain, unspecified: Secondary | ICD-10-CM | POA: Diagnosis not present

## 2022-02-07 DIAGNOSIS — M47816 Spondylosis without myelopathy or radiculopathy, lumbar region: Secondary | ICD-10-CM | POA: Diagnosis not present

## 2022-02-07 DIAGNOSIS — Z79891 Long term (current) use of opiate analgesic: Secondary | ICD-10-CM | POA: Diagnosis not present

## 2022-02-07 DIAGNOSIS — M159 Polyosteoarthritis, unspecified: Secondary | ICD-10-CM | POA: Diagnosis not present

## 2022-02-14 DIAGNOSIS — M6281 Muscle weakness (generalized): Secondary | ICD-10-CM | POA: Diagnosis not present

## 2022-02-14 DIAGNOSIS — M545 Low back pain, unspecified: Secondary | ICD-10-CM | POA: Diagnosis not present

## 2022-02-15 DIAGNOSIS — M545 Low back pain, unspecified: Secondary | ICD-10-CM | POA: Diagnosis not present

## 2022-02-15 DIAGNOSIS — M25562 Pain in left knee: Secondary | ICD-10-CM | POA: Diagnosis not present

## 2022-02-21 DIAGNOSIS — H25813 Combined forms of age-related cataract, bilateral: Secondary | ICD-10-CM | POA: Diagnosis not present

## 2022-02-21 DIAGNOSIS — E119 Type 2 diabetes mellitus without complications: Secondary | ICD-10-CM | POA: Diagnosis not present

## 2022-02-21 DIAGNOSIS — H3530 Unspecified macular degeneration: Secondary | ICD-10-CM | POA: Diagnosis not present

## 2022-02-27 DIAGNOSIS — M25562 Pain in left knee: Secondary | ICD-10-CM | POA: Diagnosis not present

## 2022-03-07 DIAGNOSIS — M25562 Pain in left knee: Secondary | ICD-10-CM | POA: Diagnosis not present

## 2022-03-17 DIAGNOSIS — M25562 Pain in left knee: Secondary | ICD-10-CM | POA: Diagnosis not present

## 2022-04-05 DIAGNOSIS — J069 Acute upper respiratory infection, unspecified: Secondary | ICD-10-CM | POA: Diagnosis not present

## 2022-04-05 DIAGNOSIS — R070 Pain in throat: Secondary | ICD-10-CM | POA: Diagnosis not present

## 2022-04-10 DIAGNOSIS — S83242A Other tear of medial meniscus, current injury, left knee, initial encounter: Secondary | ICD-10-CM | POA: Diagnosis not present

## 2022-04-10 DIAGNOSIS — M84362D Stress fracture, left tibia, subsequent encounter for fracture with routine healing: Secondary | ICD-10-CM | POA: Diagnosis not present

## 2022-04-12 DIAGNOSIS — E785 Hyperlipidemia, unspecified: Secondary | ICD-10-CM | POA: Diagnosis not present

## 2022-04-12 DIAGNOSIS — E1169 Type 2 diabetes mellitus with other specified complication: Secondary | ICD-10-CM | POA: Diagnosis not present

## 2022-04-23 DIAGNOSIS — E1169 Type 2 diabetes mellitus with other specified complication: Secondary | ICD-10-CM | POA: Diagnosis not present

## 2022-04-23 DIAGNOSIS — D509 Iron deficiency anemia, unspecified: Secondary | ICD-10-CM | POA: Diagnosis not present

## 2022-04-23 DIAGNOSIS — E785 Hyperlipidemia, unspecified: Secondary | ICD-10-CM | POA: Diagnosis not present

## 2022-04-23 DIAGNOSIS — I2510 Atherosclerotic heart disease of native coronary artery without angina pectoris: Secondary | ICD-10-CM | POA: Diagnosis not present

## 2022-04-23 DIAGNOSIS — I10 Essential (primary) hypertension: Secondary | ICD-10-CM | POA: Diagnosis not present

## 2022-04-27 DIAGNOSIS — Z4889 Encounter for other specified surgical aftercare: Secondary | ICD-10-CM | POA: Diagnosis not present

## 2022-05-05 ENCOUNTER — Other Ambulatory Visit: Payer: Self-pay | Admitting: Cardiovascular Disease

## 2022-05-07 DIAGNOSIS — M47816 Spondylosis without myelopathy or radiculopathy, lumbar region: Secondary | ICD-10-CM | POA: Diagnosis not present

## 2022-05-07 DIAGNOSIS — M5127 Other intervertebral disc displacement, lumbosacral region: Secondary | ICD-10-CM | POA: Diagnosis not present

## 2022-05-07 DIAGNOSIS — Z79899 Other long term (current) drug therapy: Secondary | ICD-10-CM | POA: Diagnosis not present

## 2022-05-07 DIAGNOSIS — M5137 Other intervertebral disc degeneration, lumbosacral region: Secondary | ICD-10-CM | POA: Diagnosis not present

## 2022-05-07 DIAGNOSIS — J449 Chronic obstructive pulmonary disease, unspecified: Secondary | ICD-10-CM | POA: Diagnosis not present

## 2022-05-07 DIAGNOSIS — Z79891 Long term (current) use of opiate analgesic: Secondary | ICD-10-CM | POA: Diagnosis not present

## 2022-05-14 DIAGNOSIS — R070 Pain in throat: Secondary | ICD-10-CM | POA: Diagnosis not present

## 2022-05-14 DIAGNOSIS — R0981 Nasal congestion: Secondary | ICD-10-CM | POA: Diagnosis not present

## 2022-05-14 DIAGNOSIS — J069 Acute upper respiratory infection, unspecified: Secondary | ICD-10-CM | POA: Diagnosis not present

## 2022-05-21 ENCOUNTER — Other Ambulatory Visit: Payer: Self-pay | Admitting: *Deleted

## 2022-05-21 MED ORDER — ISOSORBIDE MONONITRATE ER 30 MG PO TB24: 30.0000 mg | ORAL_TABLET | Freq: Every day | ORAL | 0 refills | Status: DC

## 2022-06-04 ENCOUNTER — Other Ambulatory Visit: Payer: Self-pay

## 2022-06-04 MED ORDER — LISINOPRIL 20 MG PO TABS: 20.0000 mg | ORAL_TABLET | Freq: Every day | ORAL | 1 refills | Status: DC

## 2022-06-21 NOTE — Progress Notes (Unsigned)
Office Visit    Patient Name: Erik Keith Date of Encounter: 06/21/2022  Primary Care Provider:  Paulina Fusi, MD Primary Cardiologist:  Verne Carrow, MD Primary Electrophysiologist: None   Past Medical History    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   23 mm Edwards Inspiris Resilia stented bioprosthetic tissue valve   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   LIMA to LAD, Sequential SVG to OM1-OM2, SVG to PDA, EVH via right thigh   Type 2 diabetes mellitus (HCC)    Past Surgical History:  Procedure Laterality Date   AORTIC VALVE REPLACEMENT N/A 10/03/2017   Procedure: AORTIC VALVE REPLACEMENT (AVR);  Surgeon: Purcell Nails, MD;  Location: [facility] OR;  Service: Open Heart Surgery;  Laterality: N/A;   APPENDECTOMY     BACK SURGERY  x3   CORONARY ARTERY BYPASS GRAFT N/A 10/03/2017   Procedure: CORONARY ARTERY BYPASS GRAFTING (CABG)x three, USING LEFT INTERNAL MAMMARY ARTERY AND RIGHT GREATER SAPHENOUS VEIN HARVESTED ENDOSCOPICALLY;  Surgeon: Purcell Nails, MD;  Location: [facility] OR;  Service: Open Heart Surgery;  Laterality: N/A;   CORONARY/GRAFT ACUTE MI REVASCULARIZATION N/A 10/02/2017   Procedure: Coronary/Graft Acute MI Revascularization;  Surgeon: Corky Crafts, MD;  Location: [facility] INVASIVE CV LAB;  Service: Cardiovascular;  Laterality: N/A;   LEFT HEART CATH AND  CORONARY ANGIOGRAPHY N/A 10/02/2017   Procedure: LEFT HEART CATH AND CORONARY ANGIOGRAPHY;  Surgeon: Corky Crafts, MD;  Location: [facility] INVASIVE CV LAB;  Service: Cardiovascular;  Laterality: N/A;   LUMBAR LAMINECTOMY/DECOMPRESSION MICRODISCECTOMY N/A 12/04/2021   Procedure: L2-3 DECOMPRESSION;  Surgeon: Venita Lick, MD;  Location: [facility] OR;  Service: Orthopedics;  Laterality: N/A;  2.5 hrs 3 C-Bed   RIGHT/LEFT HEART CATH AND CORONARY ANGIOGRAPHY N/A 09/12/2017   Procedure: RIGHT/LEFT HEART CATH AND CORONARY ANGIOGRAPHY;  Surgeon: Kathleene Hazel, MD;  Location: [facility] INVASIVE CV LAB;  Service: Cardiovascular;  Laterality: N/A;   TONSILLECTOMY      Allergies  Allergies  Allergen Reactions   Contrast Media [Iodinated Contrast Media] Hives   Penicillins Hives    Has patient had a PCN reaction causing immediate rash, facial/tongue/throat swelling, SOB or lightheadedness with hypotension: unkn Has patient had a PCN reaction causing severe rash involving mucus membranes or skin necrosis: unkn Has patient had a PCN reaction that required hospitalization: unkn Has patient had a PCN reaction occurring within the last 10 years: no If all of the above answers are "NO", then Levier proceed with Cephalosporin use.      History of Present Illness    Erik Keith  is a 82 year old male with a PMH of CAD s/p PCI/stent to LAD 12/2006, DES to RCA 2009, CABG x 4 10/2017 with bioprosthetic AVR, GERD, MGUS, HLD, DM type II, HTN, IDA  Erik Keith has  been followed since 2019 initially by Dr. Dulce Sellar and now by Dr. Clifton James.  He suffered a lateral STEMI and underwent LHC showing three-vessel disease with emergent four-vessel CABG (LIMA to LAD, Sequential SVG to OM1-OM2, SVG to PDA, EVH via right thigh) and AVR completed.  2D echo completed 03/2019 with EF of 60 to 65% with normally functioning AVR.  He was last seen on 05/2020 by Dr. Clifton James and was doing well with no new cardiac complaints.  He was seen  last in the office on 05/2021 and had suffered a recent mechanical fall with bruises on arms and abdomen.  2D echo was updated with normal EF of 60 to 65% with no RWMA and mild MVR with AVR performing normally.  Since last being seen in the office patient reports***.  Patient denies chest pain, palpitations, dyspnea, PND, orthopnea, nausea, vomiting, dizziness, syncope, edema, weight gain, or early satiety.     ***Notes: SBE prophylaxis Home Medications    Current Outpatient Medications  Medication Sig Dispense Refill   amLODipine (NORVASC) 10 MG tablet Take 1 tablet (10 mg total) by mouth daily. 90 tablet 3   atorvastatin (LIPITOR) 40 MG tablet Take 40 mg by mouth at bedtime.     Cyanocobalamin (B-12) 1000 MCG TABS Take 1,000 mcg by mouth daily.     ferrous sulfate 325 (65 FE) MG EC tablet Take 325 mg by mouth 3 (three) times a week.     gabapentin (NEURONTIN) 300 MG capsule Take 300 mg by mouth 2 (two) times daily.     glipiZIDE (GLUCOTROL XL) 5 MG 24 hr tablet Take 5 mg by mouth daily with breakfast.     glucose blood (ONETOUCH ULTRA) test strip USE TO CHECK BLOOD SUGAR ONCE A DAY E11.9     hydrochlorothiazide (MICROZIDE) 12.5 MG capsule TAKE 1 CAPSULE BY MOUTH EVERY DAY 90 capsule 1   HYDROcodone-acetaminophen (NORCO) 10-325 MG tablet Take 1 tablet by mouth 3 (three) times daily as needed.     isosorbide mononitrate (IMDUR) 30 MG 24 hr tablet Take 1 tablet (30 mg total) by mouth daily. 90 tablet 0   lisinopril (ZESTRIL) 20 MG tablet Take 1 tablet (20 mg total) by mouth daily. 90 tablet 1   metFORMIN (GLUCOPHAGE) 500 MG tablet Take 500 mg by mouth 2 (two) times daily with a meal.     methocarbamol (ROBAXIN) 500 MG tablet Take 500 mg by mouth 3 (three) times daily as needed.     nitroGLYCERIN (NITROSTAT) 0.4 MG SL tablet Place 1 tablet (0.4 mg total) under the tongue every 5 (five) minutes as needed for chest pain. 25 tablet 3   ondansetron (ZOFRAN) 4 MG tablet Take 1 tablet (4 mg total)  by mouth every 8 (eight) hours as needed for nausea or vomiting. 20 tablet 0   pantoprazole (PROTONIX) 40 MG tablet Take 40 mg by mouth daily.     Polyethylene Glycol 3350 (MIRALAX PO) Take 17 g by mouth daily. WITH COFFEE     Wheat Dextrin (BENEFIBER) POWD Benefiber, Mix 1 tablespoon with a liquid and drink once daily.     zolpidem (AMBIEN) 10 MG tablet Take 10 mg by mouth at bedtime as needed.     No current facility-administered medications for this visit.     Review of Systems  Please see the history of present illness.    (+)*** (+)***  All other systems reviewed and are otherwise negative except as noted above.  Physical Exam    Wt Readings  from Last 3 Encounters:  02/05/22 189 lb 4.8 oz (85.9 kg)  12/04/21 193 lb (87.5 kg)  12/01/21 193 lb (87.5 kg)   BJ:YNWGN were no vitals filed for this visit.,There is no height or weight on file to calculate BMI.  Constitutional:      Appearance: Healthy appearance. Not in distress.  Neck:     Vascular: JVD normal.  Pulmonary:     Effort: Pulmonary effort is normal.     Breath sounds: No wheezing. No rales. Diminished in the bases Cardiovascular:     Normal rate. Regular rhythm. Normal S1. Normal S2.      Murmurs: There is no murmur.  Edema:    Peripheral edema absent.  Abdominal:     Palpations: Abdomen is soft non tender. There is no hepatomegaly.  Skin:    General: Skin is warm and dry.  Neurological:     General: No focal deficit present.     Mental Status: Alert and oriented to person, place and time.     Cranial Nerves: Cranial nerves are intact.  EKG/LABS/ Recent Cardiac Studies    ECG personally reviewed by me today - ***  Cardiac Studies & Procedures   CARDIAC CATHETERIZATION  CARDIAC CATHETERIZATION 10/02/2017  Narrative  Ost LAD to Prox LAD lesion is 30% stenosed.  Mid LAD-1 lesion is 60% stenosed.  Mid LAD-2 lesion is 70% stenosed.  Prox Cx to Mid Cx lesion is 100% stenosed.  Balloon angioplasty  was performed using a BALLOON SAPPHIRE 2.0X12.  Post intervention, there is a 25% residual stenosis.  Ost Cx to Prox Cx lesion is 80% stenosed.  Mid RCA to Dist RCA lesion is 60% stenosed.  Mid RCA lesion is 70% stenosed.  RPDA lesion is 99% stenosed.  Lat 2nd Mrg lesion is 90% stenosed.  2nd Mrg lesion is 80% stenosed.    Recommend uninterrupted dual antiplatelet therapy with Aspirin 81mg  daily and Clopidogrel 75mg  daily for a minimum of 12 months (ACS - Class I recommendation).  DAPT can start after CABG.  For now, will continue IV tirofiban for 18 hours. Depending on if the timing of surgery is delayed from 10/8, could start Plavix and stop 5 days prior to surgery.  Will discuss with Dr. Cornelius Moras in AM.  Increase statin potency.  Start beta blocker.  Findings Coronary Findings Diagnostic  Dominance: Right  Left Anterior Descending Vessel is large. Ost LAD to Prox LAD lesion is 30% stenosed. The lesion was previously treated using a stent (unknown type) over 2 years ago. Mid LAD-1 lesion is 60% stenosed. Mid LAD-2 lesion is 70% stenosed.  Left Circumflex Ost Cx to Prox Cx lesion is 80% stenosed. The lesion is calcified. Prox Cx to Mid Cx lesion is 100% stenosed.  First Obtuse Marginal Branch Vessel is large in size.  Second Obtuse Marginal Branch Vessel is moderate in size. 2nd Mrg lesion is 80% stenosed.  Lateral Second Obtuse Marginal Branch Lat 2nd Mrg lesion is 90% stenosed.  Third Obtuse Marginal Branch Vessel is moderate in size.  Right Coronary Artery Mid RCA lesion is 70% stenosed. Mid RCA to Dist RCA lesion is 60% stenosed. The lesion was previously treated using a stent (unknown type) over 2 years ago.  Right Posterior Descending Artery RPDA lesion is 99% stenosed.  Intervention  Prox Cx to Mid Cx lesion Angioplasty WIRE FIGHTER CROSSING guidewire used to cross lesion. Balloon angioplasty was performed using a BALLOON SAPPHIRE 2.0X12. Maximum  pressure: 8 atm. 1.0 mm balloon was  needed to cross the lesion.  THis balloon ruptured after several inflations. Post-Intervention Lesion Assessment The intervention was successful. Pre-interventional TIMI flow is 1. Post-intervention TIMI flow is 3. No complications occurred at this lesion. There is a 25% residual stenosis post intervention.   CARDIAC CATHETERIZATION  CARDIAC CATHETERIZATION 09/12/2017  Narrative  Mid RCA to Dist RCA lesion is 60% stenosed.  Mid RCA lesion is 70% stenosed.  RPDA lesion is 99% stenosed.  Prox Cx to Mid Cx lesion is 99% stenosed.  Ost Cx to Prox Cx lesion is 80% stenosed.  Ost LAD to Prox LAD lesion is 30% stenosed.  Mid LAD-1 lesion is 60% stenosed.  Mid LAD-2 lesion is 70% stenosed.  LV end diastolic pressure is normal.  1. Severe triple vessel CAD 2. Moderately severe stenosis in the mid LAD. Patent stent proximal LAD 3. Severe stenosis involving a heavily calcified segment of the proximal Circumflex. Subtotal occlusion of the mid segment of the large branching obtuse marginal. The stenosis involves both sub-branches of the obtuse marginal 4. The RCA is a large dominant vessel with a proximal aneurysmal segment followed by an old stent in the mid vessel. There is moderately severe disease in the mid vessel prior to the old stent and within the old stent. 5. Severe aortic stenosis (peak to peak gradient 40 mmHg, mean gradient 33.6 mmHg, AVA 0.87 cm2). By echo the valve leaflets appear to be thickened and calcified and they do not open well. Dimensionless index below 0.25 and AVA 0.77cm2 by echo.  Recommendations: He has complex multivessel CAD and severe aortic valve stenosis. His CAD would be difficult to treat with PCI given the extensive disease in the Circumflex. His distal targets are patent. I think we should consider surgical AVR and CABG. I will arrange for him to be seen by Dr. Cornelius Moras or Dr. Laneta Simmers to review options for surgery. He would be  a good candidate for TAVR but given his extensive CAD, a percutaneous approach does not seem like the optimal approach.  Findings Coronary Findings Diagnostic  Dominance: Right  Left Anterior Descending Vessel is large. Ost LAD to Prox LAD lesion is 30% stenosed. The lesion was previously treated using a stent (unknown type) over 2 years ago. Mid LAD-1 lesion is 60% stenosed. Mid LAD-2 lesion is 70% stenosed.  Left Circumflex Ost Cx to Prox Cx lesion is 80% stenosed. The lesion is calcified. Prox Cx to Mid Cx lesion is 99% stenosed.  First Obtuse Marginal Branch Vessel is large in size.  Second Obtuse Marginal Branch Vessel is moderate in size.  Third Obtuse Marginal Branch Vessel is moderate in size.  Right Coronary Artery Mid RCA lesion is 70% stenosed. Mid RCA to Dist RCA lesion is 60% stenosed. The lesion was previously treated using a stent (unknown type) over 2 years ago.  Right Posterior Descending Artery RPDA lesion is 99% stenosed.  Intervention  No interventions have been documented.     ECHOCARDIOGRAM  ECHOCARDIOGRAM COMPLETE 06/19/2021  Narrative ECHOCARDIOGRAM REPORT    Patient Name:   KRIKOR WILLET Levert Date of Exam: 06/19/2021 Medical Rec #:  [id number]        Height:       71.0 in Accession #:    [id number]       Weight:       192.0 lb Date of Birth:  10/28/1940        BSA:          2.072 m Patient Age:  81 years         BP:           128/76 mmHg Patient Gender: M                HR:           57 bpm. Exam Location:  [address]  Procedure: 2D Echo, Cardiac Doppler and Color Doppler  Indications:    Z95.2 AVR I25.10 CAD  History:        Patient has prior history of Echocardiogram examinations, most recent 03/23/2019. CAD, Prior CABG and AVR -23 mm Edwards Inspiris Resilia Stented Bioprosthetic; Risk Factors:Hypertension and Diabetes. Aortic Valve: 23 mm Edwards Inspiris Resilia Stented Bovine Pericardial Tissue valve is present in the  aortic position. Procedure Date: 10/03/2017.  Sonographer:    Daphine Deutscher RDCS Referring Phys: [id number] Sharrell Ku BHAGAT  IMPRESSIONS   1. Left ventricular ejection fraction, by estimation, is 60 to 65%. The left ventricle has normal function. The left ventricle has no regional wall motion abnormalities. Left ventricular diastolic parameters are indeterminate. 2. Right ventricular systolic function is normal. The right ventricular size is normal. There is normal pulmonary artery systolic pressure. The estimated right ventricular systolic pressure is 33.2 mmHg. 3. The mitral valve is normal in structure. Mild mitral valve regurgitation. No evidence of mitral stenosis. 4. The aortic valve has been repaired/replaced. Aortic valve regurgitation is not visualized. No aortic stenosis is present. 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 Procedure Date: 10/03/2017. Echo findings are consistent with normal structure and function of the aortic valve prosthesis. Aortic valve mean gradient measures 8.4 mmHg. Aortic valve Vmax measures 1.97 m/s. 5. The inferior vena cava is normal in size with greater than 50% respiratory variability, suggesting right atrial pressure of 3 mmHg.  FINDINGS Left Ventricle: Left ventricular ejection fraction, by estimation, is 60 to 65%. The left ventricle has normal function. The left ventricle has no regional wall motion abnormalities. The left ventricular internal cavity size was normal in size. There is no left ventricular hypertrophy. Left ventricular diastolic parameters are indeterminate.  Right Ventricle: The right ventricular size is normal. No increase in right ventricular wall thickness. Right ventricular systolic function is normal. There is normal pulmonary artery systolic pressure. The tricuspid regurgitant velocity is 2.75 m/s, and with an assumed right atrial pressure of 3 mmHg,  the estimated right ventricular systolic pressure is 33.2 mmHg.  Left Atrium: Left atrial size was normal in size.  Right Atrium: Right atrial size was normal in size.  Pericardium: There is no evidence of pericardial effusion.  Mitral Valve: The mitral valve is normal in structure. Mild mitral valve regurgitation. No evidence of mitral valve stenosis.  Tricuspid Valve: The tricuspid valve is normal in structure. Tricuspid valve regurgitation is mild . No evidence of tricuspid stenosis.  Aortic Valve: The aortic valve has been repaired/replaced. Aortic valve regurgitation is not visualized. No aortic stenosis is present. Aortic valve mean gradient measures 8.4 mmHg. Aortic valve peak gradient measures 15.5 mmHg. Aortic valve area, by VTI measures 2.91 cm. 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 Procedure Date: 10/03/2017. Echo findings are consistent with normal structure and function of the aortic valve prosthesis.  Pulmonic Valve: The pulmonic valve was normal in structure. Pulmonic valve regurgitation is trivial. No evidence of pulmonic stenosis.  Aorta: The aortic root is normal in size  and structure.  Venous: The inferior vena cava is normal in size with greater than 50% respiratory variability, suggesting right atrial pressure of 3 mmHg.  IAS/Shunts: No atrial level shunt detected by color flow Doppler.   LEFT VENTRICLE PLAX 2D LVIDd:         4.90 cm   Diastology LVIDs:         3.50 cm   LV e' medial:    6.31 cm/s LV PW:         0.90 cm   LV E/e' medial:  15.2 LV IVS:        0.90 cm   LV e' lateral:   11.00 cm/s LVOT diam:     2.30 cm   LV E/e' lateral: 8.7 LV SV:         125 LV SV Index:   60 LVOT Area:     4.15 cm   RIGHT VENTRICLE RV Basal diam:  3.70 cm RV S prime:     8.70 cm/s TAPSE (M-mode): 1.6 cm  LEFT ATRIUM             Index        RIGHT ATRIUM          Index LA diam:        5.60 cm  2.70 cm/m   RA Area:     9.42 cm LA Vol (A2C):   47.8 ml 23.07 ml/m  RA Volume:   19.30 ml 9.31 ml/m LA Vol (A4C):   29.9 ml 14.43 ml/m LA Biplane Vol: 40.1 ml 19.35 ml/m AORTIC VALVE AV Area (Vmax):    2.85 cm AV Area (Vmean):   2.77 cm AV Area (VTI):     2.91 cm AV Vmax:           196.60 cm/s AV Vmean:          133.200 cm/s AV VTI:            0.430 m AV Peak Grad:      15.5 mmHg AV Mean Grad:      8.4 mmHg LVOT Vmax:         135.00 cm/s LVOT Vmean:        88.800 cm/s LVOT VTI:          0.300 m LVOT/AV VTI ratio: 0.70  AORTA Ao Root diam: 3.10 cm Ao Asc diam:  3.50 cm  MITRAL VALVE               TRICUSPID VALVE MV Area (PHT): 3.45 cm    TR Peak grad:   30.2 mmHg MV Decel Time: 220 msec    TR Vmax:        275.00 cm/s MV E velocity: 95.90 cm/s MV A velocity: 90.30 cm/s  SHUNTS MV E/A ratio:  1.06        Systemic VTI:  0.30 m Systemic Diam: 2.30 cm  Weston Brass MD Electronically signed by Weston Brass MD Signature Date/Time: 06/19/2021/3:08:29 PM    Final   TEE  ECHO TEE 10/04/2017  Interpretation Summary  Septum: No Patent Foramen Ovale present.  Left atrium: Patent foramen ovale not present.  Aortic valve: The valve is trileaflet. Severe valve thickening present. Severe valve calcification present. Severely decreased leaflet separation. Severe stenosis. Mild regurgitation.  Mitral valve: Mild regurgitation.  Right ventricle: Normal cavity size, wall thickness and ejection fraction.    CT SCANS  CT CORONARY MORPH W/CTA COR W/SCORE 10/02/2017  Addendum 10/02/2017  2:48 PM ADDENDUM  REPORT: 10/02/2017 14:45  CLINICAL DATA:  Aortic stenosis SAVR  EXAM: Cardiac TAVR CT  TECHNIQUE: The patient was scanned on a [employer]. A 120 kV retrospective scan was triggered in the descending thoracic aorta at 111 HU's. Gantry rotation speed was 270 msecs and collimation was .9 mm. No beta blockade or nitro were given. The 3D data set  was reconstructed in 5% intervals of the R-R cycle. Systolic and diastolic phases were analyzed on a dedicated work station using MPR, MIP and VRT modes. The patient received 80 cc of contrast.  Initial scan had motion artifact in the aortic root and annulus The patient returned for repeat scanning with 2nd scan having poor opacification but no motion  FINDINGS: Aortic Valve: Tri leaflet and heavily calcified. Right coronary cups moves with fusion of non and left cusps  Aorta: No aneurysm moderate mixed plaque through out. Normal arch vessels  Sinotubular Junction: 27 mm  Ascending Thoracic Aorta: 33 mm  Aortic Arch: 26 mm  Descending Thoracic Aorta: 26 mm  Sinus of Valsalva Measurements:  Non-coronary: 29.5 mm  Right -coronary: 28 mm  Left -coronary: 29 mm  Coronary Artery Height above Annulus:  Left Main: 11.5 mm above annulus  Right Coronary: 13.5 mm above annulus  Virtual Basal Annulus Measurements:  Maximum/Minimum Diameter: 27.8 mm x 22.4 mm  Area: 496 mm 2  IMPRESSION: 1. Normal aortic root 3.3 cm with moderate mixed atherosclerotic debris Normal arch vessels  2. Severely calcified tri leaflet AV with restricted leaflet motion Right cusp moves most  Charlton Haws   Electronically Signed By: Charlton Haws M.D. On: 10/02/2017 14:45  Narrative EXAM: OVER-READ INTERPRETATION  CT CHEST  The following report is an over-read performed by radiologist Dr. Cleone Slim of [facility] on 10/02/2017. This over-read does not include interpretation of cardiac or coronary anatomy or pathology. The coronary CTA interpretation by the cardiologist is attached.  COMPARISON:  01/30/2017 chest radiograph.  11/27/2006 chest CT.  FINDINGS: Please see the separate concurrent chest CT angiogram report for details.  IMPRESSION: Please see the separate concurrent chest CT angiogram report for details.  Electronically Signed: By: Delbert Phenix  M.D. On: 10/02/2017 14:29          Risk Assessment/Calculations:   {Does this patient have ATRIAL FIBRILLATION?:[phone]}        Lab Results  Component Value Date   WBC 8.8 01/30/2022   HGB 14.7 01/30/2022   HCT 44.3 01/30/2022   MCV 95.3 01/30/2022   PLT 128 (L) 01/30/2022   Lab Results  Component Value Date   CREATININE 1.92 (H) 01/30/2022   BUN 36 (H) 01/30/2022   NA 138 01/30/2022   K 3.7 01/30/2022   CL 102 01/30/2022   CO2 26 01/30/2022   Lab Results  Component Value Date   ALT 13 01/30/2022   AST 17 01/30/2022   ALKPHOS 65 01/30/2022   BILITOT 0.8 01/30/2022   Lab Results  Component Value Date   CHOL 115 03/23/2019   HDL 32 (L) 03/23/2019   LDLCALC 48 03/23/2019   TRIG 215 (H) 03/23/2019   CHOLHDL 3.6 03/23/2019    Lab Results  Component Value Date   HGBA1C 6.4 (H) 12/04/2021     Assessment & Plan    1.  Coronary artery disease: - s/p PCI to LAD 12/2017 and DES/PCI to RCA 07/2007 with CABG x 4 10/2017 with bioprosthetic AVR -Today patient reports***  2.  Nonrheumatic aortic stenosis: -s/p aortic valve  replacement with bioprosthetic valve 10/2017 -SBE prophylaxis encouraged and discussed  3.  Essential hypertension: -Patient's blood pressure today was***  4.  Hyperlipidemia: -Patient's LDL cholesterol was***  5.  DM type II: -Patient's last hemoglobin A1c was***  Disposition: Follow-up with Verne Carrow, MD or APP in *** months Informed Consent   Shared Decision Making/Informed Consent{ All outpatient stress tests require an informed consent ([id number]) ATTESTATION ORDER       :[id number]} The risks [chest pain, shortness of breath, cardiac arrhythmias, dizziness, blood pressure fluctuations, myocardial infarction, stroke/transient ischemic attack, nausea, vomiting, allergic reaction, radiation exposure, metallic taste sensation and life-threatening complications (estimated to be 1 in 10,000)], benefits (risk stratification,  diagnosing coronary artery disease, treatment guidance) and alternatives of a nuclear stress test were discussed in detail with Mr. Bankert and he agrees to proceed.      Medication Adjustments/Labs and Tests Ordered: Current medicines are reviewed at length with the patient today.  Concerns regarding medicines are outlined above.   Signed, Napoleon Form, Leodis Rains, NP 06/21/2022, 1:55 PM [facility]

## 2022-06-22 ENCOUNTER — Ambulatory Visit (INDEPENDENT_AMBULATORY_CARE_PROVIDER_SITE_OTHER): Payer: Medicare HMO

## 2022-06-22 ENCOUNTER — Encounter: Payer: Self-pay | Admitting: Nurse Practitioner

## 2022-06-22 ENCOUNTER — Other Ambulatory Visit: Payer: Self-pay | Admitting: Nurse Practitioner

## 2022-06-22 ENCOUNTER — Ambulatory Visit: Payer: Medicare HMO | Attending: Nurse Practitioner | Admitting: Nurse Practitioner

## 2022-06-22 DIAGNOSIS — I10 Essential (primary) hypertension: Secondary | ICD-10-CM

## 2022-06-22 DIAGNOSIS — Z951 Presence of aortocoronary bypass graft: Secondary | ICD-10-CM

## 2022-06-22 DIAGNOSIS — I25110 Atherosclerotic heart disease of native coronary artery with unstable angina pectoris: Secondary | ICD-10-CM

## 2022-06-22 DIAGNOSIS — R002 Palpitations: Secondary | ICD-10-CM

## 2022-06-22 DIAGNOSIS — E1159 Type 2 diabetes mellitus with other circulatory complications: Secondary | ICD-10-CM

## 2022-06-22 DIAGNOSIS — I350 Nonrheumatic aortic (valve) stenosis: Secondary | ICD-10-CM

## 2022-06-22 DIAGNOSIS — E785 Hyperlipidemia, unspecified: Secondary | ICD-10-CM

## 2022-06-22 DIAGNOSIS — Z7984 Long term (current) use of oral hypoglycemic drugs: Secondary | ICD-10-CM | POA: Diagnosis not present

## 2022-06-22 NOTE — Progress Notes (Unsigned)
ZIO XT serial # [id number] from office inventory applied to patient.  Dr. Clifton James to read.

## 2022-06-22 NOTE — Patient Instructions (Signed)
Medication Instructions:  Your physician recommends that you continue on your current medications as directed. Please refer to the Current Medication list given to you today. *If you need a refill on your cardiac medications before your next appointment, please call your pharmacy*   Lab Work: TODAY-BMET, BNP & CBC If you have labs (blood work) drawn today and your tests are completely normal, you will receive your results only by: MyChart Message (if you have MyChart) OR A paper copy in the mail If you have any lab test that is abnormal or we need to change your treatment, we will call you to review the results.   Testing/Procedures: Your physician has requested that you have a lexiscan myoview. For further information please visit [email or web address] Please follow instruction sheet, as given.  Your physician has requested that you have an echocardiogram. Echocardiography is a painless test that uses sound waves to create images of your heart. It provides your doctor with information about the size and shape of your heart and how well your heart's chambers and valves are working. This procedure takes approximately one hour. There are no restrictions for this procedure. Please do NOT wear cologne, perfume, aftershave, or lotions (deodorant is allowed). Please arrive 15 minutes prior to your appointment time.  ZIO XT- Long Term Monitor Instructions  Your physician has requested you wear a ZIO patch monitor for 14 days.  This is a single patch monitor. [employer] supplies one patch monitor per enrollment. Additional stickers are not available. Please do not apply patch if you will be having a Nuclear Stress Test,  Echocardiogram, Cardiac CT, MRI, or Chest Xray during the period you would be wearing the  monitor. The patch cannot be worn during these tests. You cannot remove and re-apply the  ZIO XT patch monitor.  Your ZIO patch monitor will be mailed 3 day USPS to your address on file. It Warf  take 3-5 days  to receive your monitor after you have been enrolled.  Once you have received your monitor, please review the enclosed instructions. Your monitor  has already been registered assigning a specific monitor serial # to you.  Billing and Patient Assistance Program Information  We have supplied [employer] with any of your insurance information on file for billing purposes. [employer] offers a sliding scale Patient Assistance Program for patients that do not have  insurance, or whose insurance does not completely cover the cost of the ZIO monitor.  You must apply for the Patient Assistance Program to qualify for this discounted rate.  To apply, please call [employer] at [phone], select option 4, select option 2, ask to apply for  Patient Assistance Program. Meredeth Ide will ask your household income, and how many people  are in your household. They will quote your out-of-pocket cost based on that information.  [employer] will also be able to set up a 76-month, interest-free payment plan if needed.  Applying the monitor   Shave hair from upper left chest.  Hold abrader disc by orange tab. Rub abrader in 40 strokes over the upper left chest as  indicated in your monitor instructions.  Clean area with 4 enclosed alcohol pads. Let dry.  Apply patch as indicated in monitor instructions. Patch will be placed under collarbone on left  side of chest with arrow pointing upward.  Rub patch adhesive wings for 2 minutes. Remove white label marked "1". Remove the white  label marked "2". Rub patch adhesive wings for 2 additional minutes.  While looking in a  mirror, press and release button in center of patch. A small green light will  flash 3-4 times. This will be your only indicator that the monitor has been turned on.  Do not shower for the first 24 hours. You [place] shower after the first 24 hours.  Press the button if you feel a symptom. You will hear a small click. Record Date, Time and  Symptom in  the Patient Logbook.  When you are ready to remove the patch, follow instructions on the last 2 pages of Patient  Logbook. Stick patch monitor onto the last page of Patient Logbook.  Place Patient Logbook in the blue and white box. Use locking tab on box and tape box closed  securely. The blue and white box has prepaid postage on it. Please place it in the mailbox as  soon as possible. Your physician should have your test results approximately 7 days after the  monitor has been mailed back to [facility].  Call [facility] Customer Care at [phone] if you have questions regarding  your ZIO XT patch monitor. Call them immediately if you see an orange light blinking on your  monitor.  If your monitor falls off in less than 4 days, contact our Monitor department at [phone].  If your monitor becomes loose or falls off after 4 days call [employer] at [phone] for  suggestions on securing your monitor   Follow-Up: At [facility], you and your health needs are our priority.  As part of our continuing mission to provide you with exceptional heart care, we have created designated Provider Care Teams.  These Care Teams include your primary Cardiologist (physician) and Advanced Practice Providers (APPs -  Physician Assistants and Nurse Practitioners) who all work together to provide you with the care you need, when you need it.  We recommend signing up for the patient portal called "MyChart".  Sign up information is provided on this After Visit Summary.  MyChart is used to connect with patients for Virtual Visits (Telemedicine).  Patients are able to view lab/test results, encounter notes, upcoming appointments, etc.  Non-urgent messages can be sent to your provider as well.   To learn more about what you can do with MyChart, go to [email or web address].    Your next appointment:   2 month(s)  Provider:   Verne Carrow, MD  or Robin Searing, NP    Other  Instructions

## 2022-06-23 LAB — BASIC METABOLIC PANEL
BUN/Creatinine Ratio: 16 (ref 10–24)
BUN: 31 mg/dL — ABNORMAL HIGH (ref 8–27)
CO2: 23 mmol/L (ref 20–29)
Calcium: 9.4 mg/dL (ref 8.6–10.2)
Chloride: 104 mmol/L (ref 96–106)
Creatinine, Ser: 1.9 mg/dL — ABNORMAL HIGH (ref 0.76–1.27)
Glucose: 122 mg/dL — ABNORMAL HIGH (ref 70–99)
Potassium: 4.7 mmol/L (ref 3.5–5.2)
Sodium: 143 mmol/L (ref 134–144)
eGFR: 35 mL/min/{1.73_m2} — ABNORMAL LOW (ref >59)

## 2022-06-23 LAB — CBC
Hematocrit: 40.3 % (ref 37.5–51.0)
Hemoglobin: 13.8 g/dL (ref 13.0–17.7)
MCH: 33.9 pg — ABNORMAL HIGH (ref 26.6–33.0)
MCHC: 34.2 g/dL (ref 31.5–35.7)
MCV: 99 fL — ABNORMAL HIGH (ref 79–97)
Platelets: 163 10*3/uL (ref 150–450)
RBC: 4.07 x10E6/uL — ABNORMAL LOW (ref 4.14–5.80)
RDW: 13.8 % (ref 11.6–15.4)
WBC: 10.1 10*3/uL (ref 3.4–10.8)

## 2022-06-23 LAB — PRO B NATRIURETIC PEPTIDE: NT-Pro BNP: 340 pg/mL (ref 0–486)

## 2022-06-24 ENCOUNTER — Encounter: Payer: Self-pay | Admitting: Nurse Practitioner

## 2022-07-06 DIAGNOSIS — M549 Dorsalgia, unspecified: Secondary | ICD-10-CM | POA: Diagnosis not present

## 2022-07-06 DIAGNOSIS — N184 Chronic kidney disease, stage 4 (severe): Secondary | ICD-10-CM | POA: Diagnosis not present

## 2022-07-06 DIAGNOSIS — M545 Low back pain, unspecified: Secondary | ICD-10-CM | POA: Diagnosis not present

## 2022-07-06 DIAGNOSIS — M546 Pain in thoracic spine: Secondary | ICD-10-CM | POA: Diagnosis not present

## 2022-07-06 DIAGNOSIS — N183 Chronic kidney disease, stage 3 unspecified: Secondary | ICD-10-CM | POA: Diagnosis not present

## 2022-07-06 DIAGNOSIS — M47816 Spondylosis without myelopathy or radiculopathy, lumbar region: Secondary | ICD-10-CM | POA: Diagnosis not present

## 2022-07-06 DIAGNOSIS — M5416 Radiculopathy, lumbar region: Secondary | ICD-10-CM | POA: Diagnosis not present

## 2022-07-09 ENCOUNTER — Other Ambulatory Visit: Payer: Self-pay

## 2022-07-09 DIAGNOSIS — I25110 Atherosclerotic heart disease of native coronary artery with unstable angina pectoris: Secondary | ICD-10-CM | POA: Diagnosis not present

## 2022-07-09 DIAGNOSIS — Z951 Presence of aortocoronary bypass graft: Secondary | ICD-10-CM | POA: Diagnosis not present

## 2022-07-09 MED ORDER — AMLODIPINE BESYLATE 10 MG PO TABS: 10.0000 mg | ORAL_TABLET | Freq: Every day | ORAL | 3 refills | Status: DC

## 2022-07-11 ENCOUNTER — Telehealth (HOSPITAL_COMMUNITY): Payer: Self-pay | Admitting: *Deleted

## 2022-07-11 NOTE — Telephone Encounter (Signed)
Patient given detailed instructions per Myocardial Perfusion Study Information Sheet for the test on 07/18/22 Patient notified to arrive 15 minutes early and that it is imperative to arrive on time for appointment to keep from having the test rescheduled.  If you need to cancel or reschedule your appointment, please call the office within 24 hours of your appointment. . Patient verbalized understanding. Ricky Ala

## 2022-07-18 ENCOUNTER — Ambulatory Visit (HOSPITAL_BASED_OUTPATIENT_CLINIC_OR_DEPARTMENT_OTHER): Payer: Medicare HMO

## 2022-07-18 ENCOUNTER — Ambulatory Visit (HOSPITAL_COMMUNITY): Payer: Medicare HMO | Attending: Internal Medicine

## 2022-07-18 DIAGNOSIS — I5030 Unspecified diastolic (congestive) heart failure: Secondary | ICD-10-CM

## 2022-07-18 DIAGNOSIS — I350 Nonrheumatic aortic (valve) stenosis: Secondary | ICD-10-CM

## 2022-07-18 DIAGNOSIS — Z951 Presence of aortocoronary bypass graft: Secondary | ICD-10-CM

## 2022-07-18 DIAGNOSIS — I10 Essential (primary) hypertension: Secondary | ICD-10-CM

## 2022-07-18 DIAGNOSIS — E785 Hyperlipidemia, unspecified: Secondary | ICD-10-CM

## 2022-07-18 DIAGNOSIS — E1159 Type 2 diabetes mellitus with other circulatory complications: Secondary | ICD-10-CM

## 2022-07-18 DIAGNOSIS — I25110 Atherosclerotic heart disease of native coronary artery with unstable angina pectoris: Secondary | ICD-10-CM | POA: Diagnosis not present

## 2022-07-18 DIAGNOSIS — I088 Other rheumatic multiple valve diseases: Secondary | ICD-10-CM | POA: Diagnosis not present

## 2022-07-18 DIAGNOSIS — Z952 Presence of prosthetic heart valve: Secondary | ICD-10-CM

## 2022-07-18 LAB — ECHOCARDIOGRAM COMPLETE
AR max vel: 2.65 cm2
AV Area VTI: 2.76 cm2
AV Area mean vel: 2.44 cm2
AV Mean grad: 8.3 mmHg
AV Peak grad: 15.8 mmHg
Ao pk vel: 1.99 m/s
Area-P 1/2: 3.46 cm2
S' Lateral: 2.4 cm

## 2022-07-18 LAB — MYOCARDIAL PERFUSION IMAGING
Estimated workload: 1
Exercise duration (min): 1 min
Exercise duration (sec): 0 s
LV dias vol: 76 mL (ref 62–150)
LV sys vol: 25 mL
MPHR: 137 {beats}/min
Nuc Stress EF: 67 %
Peak HR: 90 {beats}/min
Percent HR: 65 %
Rest HR: 50 {beats}/min
Rest Nuclear Isotope Dose: 10.2 mCi
SDS: 3
SRS: 1
SSS: 4
ST Depression (mm): 0 mm
Stress Nuclear Isotope Dose: 31 mCi
TID: 0.94

## 2022-07-18 MED ORDER — TECHNETIUM TC 99M TETROFOSMIN IV KIT: 31.0000 | PACK | Freq: Once | INTRAVENOUS | Status: AC | PRN

## 2022-07-18 MED ORDER — PERFLUTREN LIPID MICROSPHERE
1.0000 mL | INTRAVENOUS | Status: AC | PRN
  Administered 2022-07-18: 2 mL via INTRAVENOUS

## 2022-07-18 MED ORDER — TECHNETIUM TC 99M TETROFOSMIN IV KIT
10.2000 | PACK | Freq: Once | INTRAVENOUS | Status: AC | PRN
  Administered 2022-07-18: 10.2 via INTRAVENOUS

## 2022-07-18 MED ORDER — REGADENOSON 0.4 MG/5ML IV SOLN: 0.4000 mg | Freq: Once | INTRAVENOUS | Status: AC

## 2022-07-20 ENCOUNTER — Telehealth: Payer: Self-pay | Admitting: Nurse Practitioner

## 2022-07-20 NOTE — Telephone Encounter (Signed)
Pt returning call for echo results  

## 2022-07-20 NOTE — Telephone Encounter (Signed)
Spoke with the patient and gave him lab results. He voiced understanding.

## 2022-07-23 DIAGNOSIS — N184 Chronic kidney disease, stage 4 (severe): Secondary | ICD-10-CM | POA: Diagnosis not present

## 2022-07-23 DIAGNOSIS — I10 Essential (primary) hypertension: Secondary | ICD-10-CM | POA: Diagnosis not present

## 2022-07-23 DIAGNOSIS — D509 Iron deficiency anemia, unspecified: Secondary | ICD-10-CM | POA: Diagnosis not present

## 2022-07-23 DIAGNOSIS — I2510 Atherosclerotic heart disease of native coronary artery without angina pectoris: Secondary | ICD-10-CM | POA: Diagnosis not present

## 2022-07-23 DIAGNOSIS — E785 Hyperlipidemia, unspecified: Secondary | ICD-10-CM | POA: Diagnosis not present

## 2022-07-23 DIAGNOSIS — H6123 Impacted cerumen, bilateral: Secondary | ICD-10-CM | POA: Diagnosis not present

## 2022-07-23 DIAGNOSIS — F5104 Psychophysiologic insomnia: Secondary | ICD-10-CM | POA: Diagnosis not present

## 2022-07-23 DIAGNOSIS — M5416 Radiculopathy, lumbar region: Secondary | ICD-10-CM | POA: Diagnosis not present

## 2022-07-23 DIAGNOSIS — E1169 Type 2 diabetes mellitus with other specified complication: Secondary | ICD-10-CM | POA: Diagnosis not present

## 2022-07-27 DIAGNOSIS — M545 Low back pain, unspecified: Secondary | ICD-10-CM | POA: Diagnosis not present

## 2022-08-06 DIAGNOSIS — M5136 Other intervertebral disc degeneration, lumbar region: Secondary | ICD-10-CM | POA: Diagnosis not present

## 2022-08-06 DIAGNOSIS — M47816 Spondylosis without myelopathy or radiculopathy, lumbar region: Secondary | ICD-10-CM | POA: Diagnosis not present

## 2022-08-06 DIAGNOSIS — Z87891 Personal history of nicotine dependence: Secondary | ICD-10-CM | POA: Diagnosis not present

## 2022-08-06 DIAGNOSIS — Z79899 Other long term (current) drug therapy: Secondary | ICD-10-CM | POA: Diagnosis not present

## 2022-08-06 DIAGNOSIS — M5137 Other intervertebral disc degeneration, lumbosacral region: Secondary | ICD-10-CM | POA: Diagnosis not present

## 2022-08-06 DIAGNOSIS — Z79891 Long term (current) use of opiate analgesic: Secondary | ICD-10-CM | POA: Diagnosis not present

## 2022-08-08 DIAGNOSIS — I10 Essential (primary) hypertension: Secondary | ICD-10-CM | POA: Diagnosis not present

## 2022-08-08 DIAGNOSIS — E1169 Type 2 diabetes mellitus with other specified complication: Secondary | ICD-10-CM | POA: Diagnosis not present

## 2022-08-08 DIAGNOSIS — M545 Low back pain, unspecified: Secondary | ICD-10-CM | POA: Diagnosis not present

## 2022-08-08 DIAGNOSIS — R309 Painful micturition, unspecified: Secondary | ICD-10-CM | POA: Diagnosis not present

## 2022-08-08 DIAGNOSIS — N183 Chronic kidney disease, stage 3 unspecified: Secondary | ICD-10-CM | POA: Diagnosis not present

## 2022-08-08 DIAGNOSIS — E211 Secondary hyperparathyroidism, not elsewhere classified: Secondary | ICD-10-CM | POA: Diagnosis not present

## 2022-08-08 DIAGNOSIS — E785 Hyperlipidemia, unspecified: Secondary | ICD-10-CM | POA: Diagnosis not present

## 2022-08-08 DIAGNOSIS — R809 Proteinuria, unspecified: Secondary | ICD-10-CM | POA: Diagnosis not present

## 2022-08-08 DIAGNOSIS — D6489 Other specified anemias: Secondary | ICD-10-CM | POA: Diagnosis not present

## 2022-08-08 DIAGNOSIS — E559 Vitamin D deficiency, unspecified: Secondary | ICD-10-CM | POA: Diagnosis not present

## 2022-08-08 DIAGNOSIS — I509 Heart failure, unspecified: Secondary | ICD-10-CM | POA: Diagnosis not present

## 2022-08-08 DIAGNOSIS — N189 Chronic kidney disease, unspecified: Secondary | ICD-10-CM | POA: Diagnosis not present

## 2022-08-13 DIAGNOSIS — S81801A Unspecified open wound, right lower leg, initial encounter: Secondary | ICD-10-CM | POA: Diagnosis not present

## 2022-08-13 DIAGNOSIS — Z23 Encounter for immunization: Secondary | ICD-10-CM | POA: Diagnosis not present

## 2022-08-15 DIAGNOSIS — N189 Chronic kidney disease, unspecified: Secondary | ICD-10-CM | POA: Diagnosis not present

## 2022-08-15 DIAGNOSIS — N281 Cyst of kidney, acquired: Secondary | ICD-10-CM | POA: Diagnosis not present

## 2022-08-20 DIAGNOSIS — M47817 Spondylosis without myelopathy or radiculopathy, lumbosacral region: Secondary | ICD-10-CM | POA: Diagnosis not present

## 2022-08-20 DIAGNOSIS — M48061 Spinal stenosis, lumbar region without neurogenic claudication: Secondary | ICD-10-CM | POA: Diagnosis not present

## 2022-08-20 DIAGNOSIS — M5116 Intervertebral disc disorders with radiculopathy, lumbar region: Secondary | ICD-10-CM | POA: Diagnosis not present

## 2022-08-20 DIAGNOSIS — M47816 Spondylosis without myelopathy or radiculopathy, lumbar region: Secondary | ICD-10-CM | POA: Diagnosis not present

## 2022-08-20 DIAGNOSIS — M5136 Other intervertebral disc degeneration, lumbar region: Secondary | ICD-10-CM | POA: Diagnosis not present

## 2022-08-23 DIAGNOSIS — M545 Low back pain, unspecified: Secondary | ICD-10-CM | POA: Diagnosis not present

## 2022-08-24 NOTE — Progress Notes (Deleted)
Office Visit    Patient Name: Erik Keith Date of Encounter: 08/24/2022  Primary Care Provider:  Paulina Fusi, MD Primary Cardiologist:  Verne Carrow, MD Primary Electrophysiologist: None   Past Medical History    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   23 mm Edwards Inspiris Resilia stented bioprosthetic tissue valve   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   LIMA to LAD, Sequential SVG to OM1-OM2, SVG to PDA, EVH via right thigh   Type 2 diabetes mellitus (HCC)    Past Surgical History:  Procedure Laterality Date   AORTIC VALVE REPLACEMENT N/A 10/03/2017   Procedure: AORTIC VALVE REPLACEMENT (AVR);  Surgeon: Purcell Nails, MD;  Location: [facility] OR;  Service: Open Heart Surgery;  Laterality: N/A;   APPENDECTOMY     BACK SURGERY  x3   CORONARY ARTERY BYPASS GRAFT N/A 10/03/2017   Procedure: CORONARY ARTERY BYPASS GRAFTING (CABG)x three, USING LEFT INTERNAL MAMMARY ARTERY AND RIGHT GREATER SAPHENOUS VEIN HARVESTED ENDOSCOPICALLY;  Surgeon: Purcell Nails, MD;  Location: [facility] OR;  Service: Open Heart Surgery;  Laterality: N/A;   CORONARY/GRAFT ACUTE MI REVASCULARIZATION N/A 10/02/2017   Procedure: Coronary/Graft Acute MI Revascularization;  Surgeon: Corky Crafts, MD;  Location: [facility] INVASIVE CV LAB;  Service: Cardiovascular;  Laterality: N/A;   LEFT HEART CATH AND  CORONARY ANGIOGRAPHY N/A 10/02/2017   Procedure: LEFT HEART CATH AND CORONARY ANGIOGRAPHY;  Surgeon: Corky Crafts, MD;  Location: [facility] INVASIVE CV LAB;  Service: Cardiovascular;  Laterality: N/A;   LUMBAR LAMINECTOMY/DECOMPRESSION MICRODISCECTOMY N/A 12/04/2021   Procedure: L2-3 DECOMPRESSION;  Surgeon: Venita Lick, MD;  Location: [facility] OR;  Service: Orthopedics;  Laterality: N/A;  2.5 hrs 3 C-Bed   RIGHT/LEFT HEART CATH AND CORONARY ANGIOGRAPHY N/A 09/12/2017   Procedure: RIGHT/LEFT HEART CATH AND CORONARY ANGIOGRAPHY;  Surgeon: Kathleene Hazel, MD;  Location: [facility] INVASIVE CV LAB;  Service: Cardiovascular;  Laterality: N/A;   TONSILLECTOMY      Allergies  Allergies  Allergen Reactions   Contrast Media [Iodinated Contrast Media] Hives   Penicillins Hives    Has patient had a PCN reaction causing immediate rash, facial/tongue/throat swelling, SOB or lightheadedness with hypotension: unkn Has patient had a PCN reaction causing severe rash involving mucus membranes or skin necrosis: unkn Has patient had a PCN reaction that required hospitalization: unkn Has patient had a PCN reaction occurring within the last 10 years: no If all of the above answers are "NO", then Amparan proceed with Cephalosporin use.      History of Present Illness    Erik Keith  is a 82 year old male with a PMH of CAD s/p PCI/stent to LAD 12/2006, DES to RCA 2009, CABG x 4 10/2017 with bioprosthetic AVR, GERD, MGUS, HLD, DM type II, HTN, IDA   Erik Keith  has been followed since 2019 initially by Dr. Dulce Sellar and now by Dr. Clifton James.  He suffered a lateral STEMI and underwent LHC showing three-vessel disease with emergent four-vessel CABG (LIMA to LAD, Sequential SVG to OM1-OM2, SVG to PDA, EVH via right thigh) and AVR completed.  2D echo completed 03/2019 with EF of 60 to 65% with normally functioning AVR.  He was last seen on 05/2020 by Dr. Clifton James and was doing well with no new cardiac complaints.  He was seen  last in the office on 05/2021 and had suffered a recent mechanical fall with bruises on arms and abdomen.  2D echo was updated with normal EF of 60 to 65% with no RWMA and mild MVR with AVR performing normally.  He suffered from COVID infection 05/2022.  He was last seen 06/22/2022 and reported feeling fatigue with heart racing.  2D echo was completed to evaluate AV valve which showed normal functioning and EF was 60 to 65%.  Lexiscan Myoview was also ordered showing previous MI with fixed medium defect and normal LV function.  Event monitor was also completed rhythms or atrial fibrillation.  Since last being seen in the office patient reports***.  Patient denies chest pain, palpitations, dyspnea, PND, orthopnea, nausea, vomiting, dizziness, syncope, edema, weight gain, or early satiety.     ***Notes:  Home Medications    Current Outpatient Medications  Medication Sig Dispense Refill   amLODipine (NORVASC) 10 MG tablet Take 1 tablet (10 mg total) by mouth daily. 90 tablet 3   aspirin EC 81 MG tablet Take 81 mg by mouth daily.     atorvastatin (LIPITOR) 40 MG tablet Take 40 mg by mouth at bedtime.     Cyanocobalamin (B-12) 1000 MCG TABS Take 1,000 mcg by mouth daily.     gabapentin (NEURONTIN) 300 MG capsule Take 300 mg by mouth 2 (two) times daily.     glucose blood (ONETOUCH ULTRA) test strip USE TO CHECK BLOOD SUGAR ONCE A DAY E11.9     hydrochlorothiazide (MICROZIDE) 12.5 MG capsule TAKE 1 CAPSULE BY MOUTH EVERY DAY 90 capsule 1   isosorbide mononitrate (IMDUR) 30 MG 24 hr tablet Take 1 tablet (30 mg total) by mouth daily. 90 tablet 0   lisinopril (ZESTRIL) 20 MG tablet Take 1 tablet (20 mg total) by mouth daily. 90 tablet 1   nitroGLYCERIN (NITROSTAT) 0.4 MG SL tablet Place 1 tablet (0.4 mg total) under the tongue every 5 (five) minutes as needed for chest pain. 25 tablet 3   pantoprazole (PROTONIX) 40 MG tablet Take 40 mg by mouth daily.     Wheat Dextrin (BENEFIBER) POWD Benefiber, Mix 1  tablespoon with a liquid and drink once daily.     zolpidem (AMBIEN) 10 MG tablet Take 10 mg by mouth at bedtime as needed.     No current facility-administered medications for this visit.   Facility-Administered Medications Ordered in Other Visits  Medication Dose Route Frequency Provider Last Rate Last Admin   regadenoson (LEXISCAN) injection SOLN 0.4 mg  0.4 mg Intravenous Once Wendall Stade, MD       technetium tetrofosmin (TC-MYOVIEW) injection 31 millicurie  31 millicurie Intravenous Once PRN Wendall Stade, MD         Review of Systems  Please see the history of present illness.    (+)*** (+)***  All other systems reviewed and are otherwise negative except as noted above.  Physical Exam    Wt Readings from Last 3 Encounters:  06/22/22  186 lb 9.6 oz (84.6 kg)  02/05/22 189 lb 4.8 oz (85.9 kg)  12/04/21 193 lb (87.5 kg)   WJ:XBJYN were no vitals filed for this visit.,There is no height or weight on file to calculate BMI.  Constitutional:      Appearance: Healthy appearance. Not in distress.  Neck:     Vascular: JVD normal.  Pulmonary:     Effort: Pulmonary effort is normal.     Breath sounds: No wheezing. No rales. Diminished in the bases Cardiovascular:     Normal rate. Regular rhythm. Normal S1. Normal S2.      Murmurs: There is no murmur.  Edema:    Peripheral edema absent.  Abdominal:     Palpations: Abdomen is soft non tender. There is no hepatomegaly.  Skin:    General: Skin is warm and dry.  Neurological:     General: No focal deficit present.     Mental Status: Alert and oriented to person, place and time.     Cranial Nerves: Cranial nerves are intact.  EKG/LABS/ Recent Cardiac Studies    ECG personally reviewed by me today - ***   Risk Assessment/Calculations:   {Does this patient have ATRIAL FIBRILLATION?:[phone]}        Lab Results  Component Value Date   WBC 10.1 06/22/2022   HGB 13.8 06/22/2022   HCT 40.3 06/22/2022   MCV 99 (H)  06/22/2022   PLT 163 06/22/2022   Lab Results  Component Value Date   CREATININE 1.90 (H) 06/22/2022   BUN 31 (H) 06/22/2022   NA 143 06/22/2022   K 4.7 06/22/2022   CL 104 06/22/2022   CO2 23 06/22/2022   Lab Results  Component Value Date   ALT 13 01/30/2022   AST 17 01/30/2022   ALKPHOS 65 01/30/2022   BILITOT 0.8 01/30/2022   Lab Results  Component Value Date   CHOL 115 03/23/2019   HDL 32 (L) 03/23/2019   LDLCALC 48 03/23/2019   TRIG 215 (H) 03/23/2019   CHOLHDL 3.6 03/23/2019    Lab Results  Component Value Date   HGBA1C 6.4 (H) 12/04/2021     Assessment & Plan    1. Coronary artery disease: - s/p PCI to LAD 12/2017 and DES/PCI to RCA 07/2007 with CABG x 4 10/2017 with bioprosthetic AVR -Today patient reports   2.Nonrheumatic aortic stenosis: -s/p aortic valve replacement with bioprosthetic valve 10/2017 -SBE prophylaxis encouraged and discussed  3.Essential hypertension: -Patient's blood pressure today was  4.DM type II: -Patient's last hemoglobin A1c was 6.4 -Continue treatment plan per PCP  5.  Palpitations      Disposition: Follow-up with Verne Carrow, MD or APP in *** months {Are you ordering a CV Procedure (e.g. stress test, cath, DCCV, TEE, etc)?   Press F2        :[id number]}   Medication Adjustments/Labs and Tests Ordered: Current medicines are reviewed at length with the patient today.  Concerns regarding medicines are outlined above.   Signed, Napoleon Form, Leodis Rains, NP 08/24/2022, 11:55 AM [facility]

## 2022-08-27 ENCOUNTER — Other Ambulatory Visit: Payer: Self-pay | Admitting: Cardiovascular Disease

## 2022-08-27 ENCOUNTER — Ambulatory Visit: Payer: Medicare HMO | Admitting: Nurse Practitioner

## 2022-08-27 DIAGNOSIS — E1159 Type 2 diabetes mellitus with other circulatory complications: Secondary | ICD-10-CM

## 2022-08-27 DIAGNOSIS — I10 Essential (primary) hypertension: Secondary | ICD-10-CM

## 2022-08-27 DIAGNOSIS — I25110 Atherosclerotic heart disease of native coronary artery with unstable angina pectoris: Secondary | ICD-10-CM

## 2022-08-27 DIAGNOSIS — I350 Nonrheumatic aortic (valve) stenosis: Secondary | ICD-10-CM

## 2022-08-27 DIAGNOSIS — R002 Palpitations: Secondary | ICD-10-CM

## 2022-08-30 NOTE — Progress Notes (Signed)
Office Visit    Patient Name: Erik Keith Date of Encounter: 08/30/2022  Primary Care Provider:  Paulina Fusi, MD Primary Cardiologist:  Verne Carrow, MD Primary Electrophysiologist: None   Past Medical History    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   23 mm Edwards Inspiris Resilia stented bioprosthetic tissue valve   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   LIMA to LAD, Sequential SVG to OM1-OM2, SVG to PDA, EVH via right thigh   Type 2 diabetes mellitus (HCC)    Past Surgical History:  Procedure Laterality Date   AORTIC VALVE REPLACEMENT N/A 10/03/2017   Procedure: AORTIC VALVE REPLACEMENT (AVR);  Surgeon: Purcell Nails, MD;  Location: [facility] OR;  Service: Open Heart Surgery;  Laterality: N/A;   APPENDECTOMY     BACK SURGERY  x3   CORONARY ARTERY BYPASS GRAFT N/A 10/03/2017   Procedure: CORONARY ARTERY BYPASS GRAFTING (CABG)x three, USING LEFT INTERNAL MAMMARY ARTERY AND RIGHT GREATER SAPHENOUS VEIN HARVESTED ENDOSCOPICALLY;  Surgeon: Purcell Nails, MD;  Location: [facility] OR;  Service: Open Heart Surgery;  Laterality: N/A;   CORONARY/GRAFT ACUTE MI REVASCULARIZATION N/A 10/02/2017   Procedure: Coronary/Graft Acute MI Revascularization;  Surgeon: Corky Crafts, MD;  Location: [facility] INVASIVE CV LAB;  Service: Cardiovascular;  Laterality: N/A;   LEFT HEART CATH AND  CORONARY ANGIOGRAPHY N/A 10/02/2017   Procedure: LEFT HEART CATH AND CORONARY ANGIOGRAPHY;  Surgeon: Corky Crafts, MD;  Location: [facility] INVASIVE CV LAB;  Service: Cardiovascular;  Laterality: N/A;   LUMBAR LAMINECTOMY/DECOMPRESSION MICRODISCECTOMY N/A 12/04/2021   Procedure: L2-3 DECOMPRESSION;  Surgeon: Venita Lick, MD;  Location: [facility] OR;  Service: Orthopedics;  Laterality: N/A;  2.5 hrs 3 C-Bed   RIGHT/LEFT HEART CATH AND CORONARY ANGIOGRAPHY N/A 09/12/2017   Procedure: RIGHT/LEFT HEART CATH AND CORONARY ANGIOGRAPHY;  Surgeon: Kathleene Hazel, MD;  Location: [facility] INVASIVE CV LAB;  Service: Cardiovascular;  Laterality: N/A;   TONSILLECTOMY      Allergies  Allergies  Allergen Reactions   Contrast Media [Iodinated Contrast Media] Hives   Penicillins Hives    Has patient had a PCN reaction causing immediate rash, facial/tongue/throat swelling, SOB or lightheadedness with hypotension: unkn Has patient had a PCN reaction causing severe rash involving mucus membranes or skin necrosis: unkn Has patient had a PCN reaction that required hospitalization: unkn Has patient had a PCN reaction occurring within the last 10 years: no If all of the above answers are "NO", then [place] proceed with Cephalosporin use.      History of Present Illness    Erik Keith  is a 82 year old male with a PMH of CAD s/p PCI/stent to LAD 12/2006, DES to RCA 2009, CABG x 4 10/2017 with bioprosthetic AVR, GERD, MGUS, HLD, DM type II, HTN, IDA who presents today for  follow up[.   Mr. Rietz has been followed since 2019 initially by Dr. Dulce Sellar and now by Dr. Clifton James.  He suffered a lateral STEMI and underwent LHC showing three-vessel disease with emergent four-vessel CABG (LIMA to LAD, Sequential SVG to OM1-OM2, SVG to PDA, EVH via right thigh) and AVR completed.  2D echo completed 03/2019 with EF of 60 to 65% with normally functioning AVR.  He was last seen on 05/2020 by Dr. Clifton James and was doing well with no new  cardiac complaints.  He was seen last in the office on 05/2021 and had suffered a recent mechanical fall with bruises on arms and abdomen.  2D echo was updated with normal EF of 60 to 65% with no RWMA and mild MVR with AVR performing normally.  He suffered from COVID infection 05/2022.  He was last seen 06/22/2022 and reported feeling fatigue with heart racing.  2D echo was completed to evaluate AV valve which showed normal functioning and EF was 60 to 65%.  Lexiscan Myoview was also ordered showing previous MI with fixed medium defect and normal LV function.  Event monitor was also completed showing no abnormal rhythms or atrial fibrillation.   Since last being seen in the office patient reports that he is feeling much better since his previous visit.  His blood pressure today is controlled at 122/60 and heart rate is 72 bpm.  He is compliant with his current medications and denies any adverse reactions.  During today's visit we reviewed the results of his stress test, echo, and event monitor.  He had all questions answered to his satisfaction.  We also discussed his increased bruising and patient has a history of MGUS and is followed by oncology/hematology.  He is scheduled to have a follow-up in January and I advised him to contact them sooner for advisement regarding his increased bruising.  He is also experiencing increased back pain and recently underwent a MRI and is scheduled to have epidural ESI injections.  Patient denies chest pain, palpitations, dyspnea, PND, orthopnea, nausea, vomiting, dizziness, syncope, edema, weight gain, or early satiety.    Home Medications    Current Outpatient Medications  Medication Sig Dispense Refill   amLODipine (NORVASC) 10 MG tablet Take 1 tablet (10 mg total) by mouth daily. 90 tablet 3   aspirin EC 81 MG tablet Take 81 mg by mouth daily.     atorvastatin (LIPITOR) 40 MG tablet Take 40 mg by mouth at bedtime.     Cyanocobalamin (B-12) 1000 MCG TABS Take 1,000 mcg  by mouth daily.     gabapentin (NEURONTIN) 300 MG capsule Take 300 mg by mouth 2 (two) times daily.     glucose blood (ONETOUCH ULTRA) test strip USE TO CHECK BLOOD SUGAR ONCE A DAY E11.9     hydrochlorothiazide (MICROZIDE) 12.5 MG capsule TAKE 1 CAPSULE BY MOUTH EVERY DAY 90 capsule 1   isosorbide mononitrate (IMDUR) 30 MG 24 hr tablet TAKE 1 TABLET(30 MG) BY MOUTH DAILY 90 tablet 3   lisinopril (ZESTRIL) 20 MG tablet Take 1 tablet (20 mg total) by mouth daily. 90 tablet 1   nitroGLYCERIN (NITROSTAT) 0.4 MG SL tablet Place 1 tablet (0.4 mg total) under the tongue every 5 (five) minutes as needed for chest pain. 25 tablet 3   pantoprazole (PROTONIX) 40 MG tablet Take 40 mg by mouth daily.     Wheat Dextrin (BENEFIBER) POWD Benefiber, Mix 1 tablespoon with a liquid and drink once daily.     zolpidem (  AMBIEN) 10 MG tablet Take 10 mg by mouth at bedtime as needed.     No current facility-administered medications for this visit.   Facility-Administered Medications Ordered in Other Visits  Medication Dose Route Frequency Provider Last Rate Last Admin   regadenoson (LEXISCAN) injection SOLN 0.4 mg  0.4 mg Intravenous Once Wendall Stade, MD       technetium tetrofosmin (TC-MYOVIEW) injection 31 millicurie  31 millicurie Intravenous Once PRN Wendall Stade, MD         Review of Systems  Please see the history of present illness.    (+) Bruising (+) Back pain  All other systems reviewed and are otherwise negative except as noted above.  Physical Exam    Wt Readings from Last 3 Encounters:  06/22/22 186 lb 9.6 oz (84.6 kg)  02/05/22 189 lb 4.8 oz (85.9 kg)  12/04/21 193 lb (87.5 kg)   ON:GEXBM were no vitals filed for this visit.,There is no height or weight on file to calculate BMI.  Constitutional:      Appearance: Healthy appearance. Not in distress.  Neck:     Vascular: JVD normal.  Pulmonary:     Effort: Pulmonary effort is normal.     Breath sounds: No wheezing. No rales.  Diminished in the bases Cardiovascular:     Normal rate. Regular rhythm. Normal S1. Normal S2.      Murmurs: There is no murmur.  Edema:    Peripheral edema absent.  Abdominal:     Palpations: Abdomen is soft non tender. There is no hepatomegaly.  Skin:    General: Skin is warm and dry.  Neurological:     General: No focal deficit present.     Mental Status: Alert and oriented to person, place and time.     Cranial Nerves: Cranial nerves are intact.  EKG/LABS/ Recent Cardiac Studies    ECG personally reviewed by me today -none completed today  Lab Results  Component Value Date   WBC 10.1 06/22/2022   HGB 13.8 06/22/2022   HCT 40.3 06/22/2022   MCV 99 (H) 06/22/2022   PLT 163 06/22/2022   Lab Results  Component Value Date   CREATININE 1.90 (H) 06/22/2022   BUN 31 (H) 06/22/2022   NA 143 06/22/2022   K 4.7 06/22/2022   CL 104 06/22/2022   CO2 23 06/22/2022   Lab Results  Component Value Date   ALT 13 01/30/2022   AST 17 01/30/2022   ALKPHOS 65 01/30/2022   BILITOT 0.8 01/30/2022   Lab Results  Component Value Date   CHOL 115 03/23/2019   HDL 32 (L) 03/23/2019   LDLCALC 48 03/23/2019   TRIG 215 (H) 03/23/2019   CHOLHDL 3.6 03/23/2019    Lab Results  Component Value Date   HGBA1C 6.4 (H) 12/04/2021     Assessment & Plan    1. Coronary artery disease: - s/p PCI to LAD 12/2017 and DES/PCI to RCA 07/2007 with CABG x 4 10/2017 with bioprosthetic AVR -Today patient reports no chest pain or shortness of breath with exertion. -Patient was advised to continue current GDMT with ASA 81 mg, Lipitor 40 mg, isosorbide 30 mg, as needed Nitrostat 0.4 mg   2.Nonrheumatic aortic stenosis: -s/p aortic valve replacement with bioprosthetic valve 10/2017 -SBE prophylaxis encouraged and discussed -   3.Essential hypertension: -Patient's blood pressure today was controlled at 122/60 -Continue lisinopril 20 mg daily, Norvasc 10 mg daily   4.DM type II: -Patient's last  hemoglobin  A1c was 6.4 -Continue treatment plan per PCP   5.  Palpitations: -Patient reports less palpitations and has been experiencing activity intolerance other than back pain.  6.MGUS: -Patient recently has experienced increased bruising on his extremities and is currently followed by hematology/oncology. -Advised him to follow-up for further advisement regarding his increased bruising.  7.  Preoperative clearance: -Patient's RCRI score is 6.6% -Patient is able to complete greater than 4 METS of activity without any difficulty and is fine to proceed with scheduled epidural ESI injections -He can hold ASA 81 mg 7 days prior to procedure and should restart postprocedure when surgically safe.   Disposition: Follow-up with Verne Carrow, MD or APP in 12 months    Medication Adjustments/Labs and Tests Ordered: Current medicines are reviewed at length with the patient today.  Concerns regarding medicines are outlined above.   Signed, Napoleon Form, Leodis Rains, NP 08/30/2022, 1:48 PM  [facility]

## 2022-08-31 ENCOUNTER — Ambulatory Visit: Payer: Medicare HMO | Attending: Nurse Practitioner | Admitting: Nurse Practitioner

## 2022-08-31 ENCOUNTER — Encounter: Payer: Self-pay | Admitting: Nurse Practitioner

## 2022-08-31 VITALS — BP 122/60 | HR 72 | Ht 71.0 in | Wt 188.0 lb

## 2022-08-31 DIAGNOSIS — E1159 Type 2 diabetes mellitus with other circulatory complications: Secondary | ICD-10-CM

## 2022-08-31 DIAGNOSIS — I25110 Atherosclerotic heart disease of native coronary artery with unstable angina pectoris: Secondary | ICD-10-CM

## 2022-08-31 DIAGNOSIS — I10 Essential (primary) hypertension: Secondary | ICD-10-CM | POA: Diagnosis not present

## 2022-08-31 DIAGNOSIS — Z953 Presence of xenogenic heart valve: Secondary | ICD-10-CM

## 2022-08-31 DIAGNOSIS — Z01810 Encounter for preprocedural cardiovascular examination: Secondary | ICD-10-CM

## 2022-08-31 DIAGNOSIS — R002 Palpitations: Secondary | ICD-10-CM

## 2022-08-31 DIAGNOSIS — Z7984 Long term (current) use of oral hypoglycemic drugs: Secondary | ICD-10-CM

## 2022-08-31 DIAGNOSIS — D472 Monoclonal gammopathy: Secondary | ICD-10-CM

## 2022-08-31 NOTE — Patient Instructions (Signed)
Medication Instructions:  Your physician recommends that you continue on your current medications as directed. Please refer to the Current Medication list given to you today. *If you need a refill on your cardiac medications before your next appointment, please call your pharmacy*   Lab Work: None ordered If you have labs (blood work) drawn today and your tests are completely normal, you will receive your results only by: MyChart Message (if you have MyChart) OR A paper copy in the mail If you have any lab test that is abnormal or we need to change your treatment, we will call you to review the results.   Testing/Procedures: None ordered   Follow-Up: At [facility], you and your health needs are our priority.  As part of our continuing mission to provide you with exceptional heart care, we have created designated Provider Care Teams.  These Care Teams include your primary Cardiologist (physician) and Advanced Practice Providers (APPs -  Physician Assistants and Nurse Practitioners) who all work together to provide you with the care you need, when you need it.  We recommend signing up for the patient portal called "MyChart".  Sign up information is provided on this After Visit Summary.  MyChart is used to connect with patients for Virtual Visits (Telemedicine).  Patients are able to view lab/test results, encounter notes, upcoming appointments, etc.  Non-urgent messages can be sent to your provider as well.   To learn more about what you can do with MyChart, go to [email or web address].    Your next appointment:   12 month(s)  Provider:   Verne Carrow, MD     Other Instructions  CALL YOUR CANCER DOCTOR ABOUT YOUR INCREASED BRUISING

## 2022-09-06 DIAGNOSIS — R21 Rash and other nonspecific skin eruption: Secondary | ICD-10-CM | POA: Diagnosis not present

## 2022-09-06 DIAGNOSIS — W57XXXA Bitten or stung by nonvenomous insect and other nonvenomous arthropods, initial encounter: Secondary | ICD-10-CM | POA: Diagnosis not present

## 2022-10-01 DIAGNOSIS — G8929 Other chronic pain: Secondary | ICD-10-CM | POA: Diagnosis not present

## 2022-10-01 DIAGNOSIS — M47817 Spondylosis without myelopathy or radiculopathy, lumbosacral region: Secondary | ICD-10-CM | POA: Diagnosis not present

## 2022-10-01 DIAGNOSIS — M961 Postlaminectomy syndrome, not elsewhere classified: Secondary | ICD-10-CM | POA: Diagnosis not present

## 2022-10-03 ENCOUNTER — Inpatient Hospital Stay: Payer: Medicare HMO

## 2022-10-03 ENCOUNTER — Inpatient Hospital Stay: Payer: Medicare HMO | Attending: Oncology | Admitting: Oncology

## 2022-10-03 VITALS — BP 138/71 | HR 67 | Temp 97.6°F | Resp 16 | Ht 71.0 in | Wt 178.4 lb

## 2022-10-03 DIAGNOSIS — D472 Monoclonal gammopathy: Secondary | ICD-10-CM | POA: Diagnosis not present
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
## 2022-10-25 DIAGNOSIS — D485 Neoplasm of uncertain behavior of skin: Secondary | ICD-10-CM | POA: Diagnosis not present

## 2022-10-25 DIAGNOSIS — E1169 Type 2 diabetes mellitus with other specified complication: Secondary | ICD-10-CM | POA: Diagnosis not present

## 2022-10-25 DIAGNOSIS — D509 Iron deficiency anemia, unspecified: Secondary | ICD-10-CM | POA: Diagnosis not present

## 2022-10-25 DIAGNOSIS — Z23 Encounter for immunization: Secondary | ICD-10-CM | POA: Diagnosis not present

## 2022-10-25 DIAGNOSIS — F5104 Psychophysiologic insomnia: Secondary | ICD-10-CM | POA: Diagnosis not present

## 2022-10-25 DIAGNOSIS — M5416 Radiculopathy, lumbar region: Secondary | ICD-10-CM | POA: Diagnosis not present

## 2022-10-25 DIAGNOSIS — N184 Chronic kidney disease, stage 4 (severe): Secondary | ICD-10-CM | POA: Diagnosis not present

## 2022-10-25 DIAGNOSIS — L821 Other seborrheic keratosis: Secondary | ICD-10-CM | POA: Diagnosis not present

## 2022-10-25 DIAGNOSIS — Z139 Encounter for screening, unspecified: Secondary | ICD-10-CM | POA: Diagnosis not present

## 2022-10-25 DIAGNOSIS — E785 Hyperlipidemia, unspecified: Secondary | ICD-10-CM | POA: Diagnosis not present

## 2022-10-25 DIAGNOSIS — I2510 Atherosclerotic heart disease of native coronary artery without angina pectoris: Secondary | ICD-10-CM | POA: Diagnosis not present

## 2022-10-25 DIAGNOSIS — L570 Actinic keratosis: Secondary | ICD-10-CM | POA: Diagnosis not present

## 2022-11-01 ENCOUNTER — Other Ambulatory Visit: Payer: Self-pay | Admitting: Physician Assistant

## 2022-11-01 DIAGNOSIS — M47816 Spondylosis without myelopathy or radiculopathy, lumbar region: Secondary | ICD-10-CM | POA: Diagnosis not present

## 2022-11-05 DIAGNOSIS — M51369 Other intervertebral disc degeneration, lumbar region without mention of lumbar back pain or lower extremity pain: Secondary | ICD-10-CM | POA: Diagnosis not present

## 2022-11-05 DIAGNOSIS — G8929 Other chronic pain: Secondary | ICD-10-CM | POA: Diagnosis not present

## 2022-11-05 DIAGNOSIS — Z79899 Other long term (current) drug therapy: Secondary | ICD-10-CM | POA: Diagnosis not present

## 2022-11-05 DIAGNOSIS — M47816 Spondylosis without myelopathy or radiculopathy, lumbar region: Secondary | ICD-10-CM | POA: Diagnosis not present

## 2022-11-05 DIAGNOSIS — Z79891 Long term (current) use of opiate analgesic: Secondary | ICD-10-CM | POA: Diagnosis not present

## 2022-11-05 DIAGNOSIS — M519 Unspecified thoracic, thoracolumbar and lumbosacral intervertebral disc disorder: Secondary | ICD-10-CM | POA: Diagnosis not present

## 2022-11-05 DIAGNOSIS — M545 Low back pain, unspecified: Secondary | ICD-10-CM | POA: Diagnosis not present

## 2022-11-05 DIAGNOSIS — Z5181 Encounter for therapeutic drug level monitoring: Secondary | ICD-10-CM | POA: Diagnosis not present

## 2022-11-08 DIAGNOSIS — N183 Chronic kidney disease, stage 3 unspecified: Secondary | ICD-10-CM | POA: Diagnosis not present

## 2022-11-08 DIAGNOSIS — E559 Vitamin D deficiency, unspecified: Secondary | ICD-10-CM | POA: Diagnosis not present

## 2022-11-08 DIAGNOSIS — R309 Painful micturition, unspecified: Secondary | ICD-10-CM | POA: Diagnosis not present

## 2022-11-08 DIAGNOSIS — I10 Essential (primary) hypertension: Secondary | ICD-10-CM | POA: Diagnosis not present

## 2022-11-08 DIAGNOSIS — E1169 Type 2 diabetes mellitus with other specified complication: Secondary | ICD-10-CM | POA: Diagnosis not present

## 2022-11-08 DIAGNOSIS — N189 Chronic kidney disease, unspecified: Secondary | ICD-10-CM | POA: Diagnosis not present

## 2022-11-08 DIAGNOSIS — M5459 Other low back pain: Secondary | ICD-10-CM | POA: Diagnosis not present

## 2022-11-08 DIAGNOSIS — R809 Proteinuria, unspecified: Secondary | ICD-10-CM | POA: Diagnosis not present

## 2022-11-27 DIAGNOSIS — C44329 Squamous cell carcinoma of skin of other parts of face: Secondary | ICD-10-CM | POA: Diagnosis not present

## 2022-11-27 DIAGNOSIS — D044 Carcinoma in situ of skin of scalp and neck: Secondary | ICD-10-CM | POA: Diagnosis not present

## 2022-12-17 ENCOUNTER — Other Ambulatory Visit: Payer: Self-pay | Admitting: Cardiovascular Disease

## 2023-01-18 DIAGNOSIS — S0003XA Contusion of scalp, initial encounter: Secondary | ICD-10-CM | POA: Diagnosis not present

## 2023-01-18 DIAGNOSIS — S81811A Laceration without foreign body, right lower leg, initial encounter: Secondary | ICD-10-CM | POA: Diagnosis not present

## 2023-01-18 DIAGNOSIS — S51811A Laceration without foreign body of right forearm, initial encounter: Secondary | ICD-10-CM | POA: Diagnosis not present

## 2023-01-18 DIAGNOSIS — R0781 Pleurodynia: Secondary | ICD-10-CM | POA: Diagnosis not present

## 2023-01-22 DIAGNOSIS — S41101A Unspecified open wound of right upper arm, initial encounter: Secondary | ICD-10-CM | POA: Diagnosis not present

## 2023-01-29 DIAGNOSIS — S41101D Unspecified open wound of right upper arm, subsequent encounter: Secondary | ICD-10-CM | POA: Diagnosis not present

## 2023-01-30 ENCOUNTER — Other Ambulatory Visit: Payer: Medicare HMO

## 2023-02-04 DIAGNOSIS — M5136 Other intervertebral disc degeneration, lumbar region with discogenic back pain only: Secondary | ICD-10-CM | POA: Diagnosis not present

## 2023-02-04 DIAGNOSIS — S2241XD Multiple fractures of ribs, right side, subsequent encounter for fracture with routine healing: Secondary | ICD-10-CM | POA: Diagnosis not present

## 2023-02-04 DIAGNOSIS — S2231XA Fracture of one rib, right side, initial encounter for closed fracture: Secondary | ICD-10-CM | POA: Diagnosis not present

## 2023-02-04 DIAGNOSIS — I700 Atherosclerosis of aorta: Secondary | ICD-10-CM | POA: Diagnosis not present

## 2023-02-04 DIAGNOSIS — M7918 Myalgia, other site: Secondary | ICD-10-CM | POA: Diagnosis not present

## 2023-02-04 DIAGNOSIS — J9811 Atelectasis: Secondary | ICD-10-CM | POA: Diagnosis not present

## 2023-02-04 DIAGNOSIS — Z951 Presence of aortocoronary bypass graft: Secondary | ICD-10-CM | POA: Diagnosis not present

## 2023-02-04 DIAGNOSIS — M47816 Spondylosis without myelopathy or radiculopathy, lumbar region: Secondary | ICD-10-CM | POA: Diagnosis not present

## 2023-02-04 DIAGNOSIS — M5459 Other low back pain: Secondary | ICD-10-CM | POA: Diagnosis not present

## 2023-02-04 DIAGNOSIS — Z952 Presence of prosthetic heart valve: Secondary | ICD-10-CM | POA: Diagnosis not present

## 2023-02-04 DIAGNOSIS — Z79891 Long term (current) use of opiate analgesic: Secondary | ICD-10-CM | POA: Diagnosis not present

## 2023-02-06 ENCOUNTER — Ambulatory Visit: Payer: Medicare HMO | Admitting: Oncology

## 2023-02-12 DIAGNOSIS — S41101D Unspecified open wound of right upper arm, subsequent encounter: Secondary | ICD-10-CM | POA: Diagnosis not present

## 2023-02-26 ENCOUNTER — Inpatient Hospital Stay: Payer: Medicare HMO | Attending: Oncology

## 2023-02-26 ENCOUNTER — Other Ambulatory Visit: Payer: Self-pay

## 2023-02-26 DIAGNOSIS — D472 Monoclonal gammopathy: Secondary | ICD-10-CM | POA: Insufficient documentation

## 2023-02-26 DIAGNOSIS — S41101D Unspecified open wound of right upper arm, subsequent encounter: Secondary | ICD-10-CM | POA: Diagnosis not present

## 2023-02-26 LAB — CMP (CANCER CENTER ONLY)
ALT: 7 U/L (ref 0–44)
AST: 14 U/L — ABNORMAL LOW (ref 15–41)
Albumin: 4.1 g/dL (ref 3.5–5.0)
Alkaline Phosphatase: 110 U/L (ref 38–126)
Anion gap: 13 (ref 5–15)
BUN: 50 mg/dL — ABNORMAL HIGH (ref 8–23)
CO2: 21 mmol/L — ABNORMAL LOW (ref 22–32)
Calcium: 10 mg/dL (ref 8.9–10.3)
Chloride: 106 mmol/L (ref 98–111)
Creatinine: 3.44 mg/dL — ABNORMAL HIGH (ref 0.61–1.24)
GFR, Estimated: 17 mL/min — ABNORMAL LOW (ref >60)
Glucose, Bld: 150 mg/dL — ABNORMAL HIGH (ref 70–99)
Potassium: 4.8 mmol/L (ref 3.5–5.1)
Sodium: 140 mmol/L (ref 135–145)
Total Bilirubin: 0.3 mg/dL (ref 0.0–1.2)
Total Protein: 7.7 g/dL (ref 6.5–8.1)

## 2023-02-26 LAB — CBC WITH DIFFERENTIAL (CANCER CENTER ONLY)
Abs Immature Granulocytes: 0.03 10*3/uL (ref 0.00–0.07)
Basophils Absolute: 0.1 10*3/uL (ref 0.0–0.1)
Basophils Relative: 1 %
Eosinophils Absolute: 0.3 10*3/uL (ref 0.0–0.5)
Eosinophils Relative: 3 %
HCT: 40.4 % (ref 39.0–52.0)
Hemoglobin: 13.3 g/dL (ref 13.0–17.0)
Immature Granulocytes: 0 %
Lymphocytes Relative: 26 %
Lymphs Abs: 2.4 10*3/uL (ref 0.7–4.0)
MCH: 30.2 pg (ref 26.0–34.0)
MCHC: 32.9 g/dL (ref 30.0–36.0)
MCV: 91.8 fL (ref 80.0–100.0)
Monocytes Absolute: 0.6 10*3/uL (ref 0.1–1.0)
Monocytes Relative: 7 %
Neutro Abs: 5.7 10*3/uL (ref 1.7–7.7)
Neutrophils Relative %: 63 %
Platelet Count: 144 10*3/uL — ABNORMAL LOW (ref 150–400)
RBC: 4.4 MIL/uL (ref 4.22–5.81)
RDW: 13.1 % (ref 11.5–15.5)
WBC Count: 9.2 10*3/uL (ref 4.0–10.5)
nRBC: 0 % (ref 0.0–0.2)
nRBC: 0 /100{WBCs}

## 2023-02-27 ENCOUNTER — Inpatient Hospital Stay: Payer: Medicare HMO

## 2023-02-27 DIAGNOSIS — E119 Type 2 diabetes mellitus without complications: Secondary | ICD-10-CM | POA: Diagnosis not present

## 2023-02-28 LAB — PROTEIN ELECTROPHORESIS, SERUM
A/G Ratio: 1.1 (ref 0.7–1.7)
Albumin ELP: 3.8 g/dL (ref 2.9–4.4)
Alpha-1-Globulin: 0.3 g/dL (ref 0.0–0.4)
Alpha-2-Globulin: 1 g/dL (ref 0.4–1.0)
Beta Globulin: 0.9 g/dL (ref 0.7–1.3)
Gamma Globulin: 1.4 g/dL (ref 0.4–1.8)
Globulin, Total: 3.5 g/dL (ref 2.2–3.9)
M-Spike, %: 0.9 g/dL — ABNORMAL HIGH
Total Protein ELP: 7.3 g/dL (ref 6.0–8.5)

## 2023-03-05 DIAGNOSIS — N184 Chronic kidney disease, stage 4 (severe): Secondary | ICD-10-CM | POA: Diagnosis not present

## 2023-03-05 DIAGNOSIS — E1169 Type 2 diabetes mellitus with other specified complication: Secondary | ICD-10-CM | POA: Diagnosis not present

## 2023-03-05 DIAGNOSIS — D509 Iron deficiency anemia, unspecified: Secondary | ICD-10-CM | POA: Diagnosis not present

## 2023-03-05 DIAGNOSIS — I2510 Atherosclerotic heart disease of native coronary artery without angina pectoris: Secondary | ICD-10-CM | POA: Diagnosis not present

## 2023-03-05 DIAGNOSIS — E785 Hyperlipidemia, unspecified: Secondary | ICD-10-CM | POA: Diagnosis not present

## 2023-03-05 DIAGNOSIS — S2241XD Multiple fractures of ribs, right side, subsequent encounter for fracture with routine healing: Secondary | ICD-10-CM | POA: Diagnosis not present

## 2023-03-05 DIAGNOSIS — F5104 Psychophysiologic insomnia: Secondary | ICD-10-CM | POA: Diagnosis not present

## 2023-03-05 NOTE — Progress Notes (Unsigned)
 [facility]  [address] [place],  [place]  [address] [phone]  Clinic Day:  03/06/2023  Referring physician: Paulina Fusi, MD  HISTORY OF PRESENT ILLNESS:  The patient is a 83 y.o. 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  However, this disease has been stable without there being a concern of transformation into multiple myeloma.  He comes in today for routine follow-up.  Since his last visit, the patient has been doing fairly well.  He denies having increased fatigue, bone pain, or other symptoms which concern him for his MGUS potentially transforming into a more aggressive plasma cell dyscrasia.  PHYSICAL EXAM:  Blood pressure 110/66, pulse (!) 57, temperature 97.6 F (36.4 C), temperature source Oral, resp. rate 14, height 5\' 11"  (1.803 m), weight 189 lb 1.6 oz (85.8 kg), SpO2 98%. Wt Readings from Last 3 Encounters:  03/06/23 189 lb 1.6 oz (85.8 kg)  10/03/22 178 lb 6.4 oz (80.9 kg)  08/31/22 188 lb (85.3 kg)   Body mass index is 26.37 kg/m. Performance status (ECOG): 1 Physical Exam Constitutional:      Appearance: Normal appearance. He is not ill-appearing.  HENT:     Mouth/Throat:     Mouth: Mucous membranes are moist.     Pharynx: Oropharynx is clear. No oropharyngeal exudate or posterior oropharyngeal erythema.  Cardiovascular:     Rate and Rhythm: Normal rate and regular rhythm.     Heart sounds: No murmur heard.    No friction rub. No gallop.  Pulmonary:     Effort: Pulmonary effort is normal. No respiratory distress.     Breath sounds: Normal breath sounds. No wheezing, rhonchi or rales.  Abdominal:     General: Bowel sounds are normal. There is no distension.     Palpations: Abdomen is soft. There is no mass.     Tenderness: There is no abdominal tenderness.  Musculoskeletal:        General: No swelling.     Right lower leg: No edema.     Left lower leg: No edema.  Lymphadenopathy:     Cervical:  No cervical adenopathy.     Upper Body:     Right upper body: No supraclavicular or axillary adenopathy.     Left upper body: No supraclavicular or axillary adenopathy.     Lower Body: No right inguinal adenopathy. No left inguinal adenopathy.  Skin:    General: Skin is warm.     Coloration: Skin is not jaundiced.     Findings: No ecchymosis, lesion or rash.  Neurological:     General: No focal deficit present.     Mental Status: He is alert and oriented to person, place, and time. Mental status is at baseline.  Psychiatric:        Mood and Affect: Mood normal.        Behavior: Behavior normal.        Thought Content: Thought content normal.    LABS:      Latest Ref Rng & Units 02/26/2023   11:43 AM 06/22/2022    4:24 PM 01/30/2022    9:31 AM  CBC  WBC 4.0 - 10.5 K/uL 9.2  10.1  8.8   Hemoglobin 13.0 - 17.0 g/dL 91.4  78.2  95.6   Hematocrit 39.0 - 52.0 % 40.4  40.3  44.3   Platelets 150 - 400 K/uL 144  163  128       Latest Ref Rng &  Units 02/26/2023   11:43 AM 06/22/2022    4:24 PM 01/30/2022    9:31 AM  CMP  Glucose 70 - 99 mg/dL 962  952  841   BUN 8 - 23 mg/dL 50  31  36   Creatinine 0.61 - 1.24 mg/dL 3.24  4.01  0.27   Sodium 135 - 145 mmol/L 140  143  138   Potassium 3.5 - 5.1 mmol/L 4.8  4.7  3.7   Chloride 98 - 111 mmol/L 106  104  102   CO2 22 - 32 mmol/L 21  23  26    Calcium 8.9 - 10.3 mg/dL 25.3  9.4  9.5   Total Protein 6.5 - 8.1 g/dL 7.7   8.2   Total Bilirubin 0.0 - 1.2 mg/dL 0.3   0.8   Alkaline Phos 38 - 126 U/L 110   65   AST 15 - 41 U/L 14   17   ALT 0 - 44 U/L 7   13     Latest Reference Range & Units 01/30/22 09:30 02/26/23 11:43  M-SPIKE, % Not Observed g/dL 0.8 (H) 0.9 (H)  (H): Data is abnormally high  Latest Reference Range & Units 02/04/20 10:22 01/27/21 09:59  M-SPIKE, % Not Observed g/dL 1.0 (H) 0.9 (H)  (H): Data is abnormally high    ASSESSMENT & PLAN:  Assessment/Plan:  An 83 y.o. male with a monoclonal gammopathy of unknown  significance.  When evaluating his most recent monoclonal parameters, they have not changed to any significant degree.  They have essentially held stable over the past 5 years.  The only issue I am concerned about with his recent labs is his acute on chronic renal insufficiency.  He is scheduled to see his nephrologist next week to discuss this further.  His primary care physician apparently has told him to hold both his lisinopril and hydrochlorothiazide to see if this Eagles lead to an improvement in his renal function over time.  I do not believe his renal dysfunction is related to his underlying MGUS having transformed into multiple myeloma.  His hemoglobin remains normal, as do his other peripheral counts.  As he is stable from an MGUS standpoint, I will see him back in 1 year for repeat clinical assessment.  The patient understands all the plans discussed today and is in agreement with them.    Izmael Duross Kirby Funk, MD

## 2023-03-06 ENCOUNTER — Telehealth: Payer: Self-pay | Admitting: Oncology

## 2023-03-06 ENCOUNTER — Inpatient Hospital Stay: Payer: Medicare HMO | Attending: Oncology | Admitting: Oncology

## 2023-03-06 ENCOUNTER — Other Ambulatory Visit: Payer: Self-pay | Admitting: Oncology

## 2023-03-06 VITALS — BP 110/66 | HR 57 | Temp 97.6°F | Resp 14 | Ht 71.0 in | Wt 189.1 lb

## 2023-03-06 DIAGNOSIS — N179 Acute kidney failure, unspecified: Secondary | ICD-10-CM | POA: Insufficient documentation

## 2023-03-06 DIAGNOSIS — D472 Monoclonal gammopathy: Secondary | ICD-10-CM | POA: Insufficient documentation

## 2023-03-06 DIAGNOSIS — N189 Chronic kidney disease, unspecified: Secondary | ICD-10-CM | POA: Diagnosis not present

## 2023-03-06 NOTE — Telephone Encounter (Signed)
 Patient has been scheduled for follow-up visit per 03/06/23 LOS.  Pt given an appt calendar with date and time.

## 2023-03-15 DIAGNOSIS — M963 Postlaminectomy kyphosis: Secondary | ICD-10-CM | POA: Diagnosis not present

## 2023-03-15 DIAGNOSIS — M8008XA Age-related osteoporosis with current pathological fracture, vertebra(e), initial encounter for fracture: Secondary | ICD-10-CM | POA: Diagnosis not present

## 2023-03-20 DIAGNOSIS — N179 Acute kidney failure, unspecified: Secondary | ICD-10-CM | POA: Diagnosis not present

## 2023-03-20 DIAGNOSIS — I878 Other specified disorders of veins: Secondary | ICD-10-CM | POA: Diagnosis not present

## 2023-03-20 DIAGNOSIS — I129 Hypertensive chronic kidney disease with stage 1 through stage 4 chronic kidney disease, or unspecified chronic kidney disease: Secondary | ICD-10-CM | POA: Diagnosis not present

## 2023-03-20 DIAGNOSIS — N189 Chronic kidney disease, unspecified: Secondary | ICD-10-CM | POA: Diagnosis not present

## 2023-04-02 DIAGNOSIS — Z9181 History of falling: Secondary | ICD-10-CM | POA: Diagnosis not present

## 2023-04-02 DIAGNOSIS — Z0000 Encounter for general adult medical examination without abnormal findings: Secondary | ICD-10-CM | POA: Diagnosis not present

## 2023-04-09 DIAGNOSIS — C44329 Squamous cell carcinoma of skin of other parts of face: Secondary | ICD-10-CM | POA: Diagnosis not present

## 2023-04-09 DIAGNOSIS — L570 Actinic keratosis: Secondary | ICD-10-CM | POA: Diagnosis not present

## 2023-04-09 DIAGNOSIS — L578 Other skin changes due to chronic exposure to nonionizing radiation: Secondary | ICD-10-CM | POA: Diagnosis not present

## 2023-04-09 DIAGNOSIS — L821 Other seborrheic keratosis: Secondary | ICD-10-CM | POA: Diagnosis not present

## 2023-04-12 ENCOUNTER — Other Ambulatory Visit: Payer: Self-pay | Admitting: Nurse Practitioner

## 2023-04-16 DIAGNOSIS — R300 Dysuria: Secondary | ICD-10-CM | POA: Diagnosis not present

## 2023-04-22 DIAGNOSIS — N481 Balanitis: Secondary | ICD-10-CM | POA: Diagnosis not present

## 2023-05-01 DIAGNOSIS — Z760 Encounter for issue of repeat prescription: Secondary | ICD-10-CM | POA: Diagnosis not present

## 2023-05-01 DIAGNOSIS — Z79899 Other long term (current) drug therapy: Secondary | ICD-10-CM | POA: Diagnosis not present

## 2023-05-01 DIAGNOSIS — M5136 Other intervertebral disc degeneration, lumbar region with discogenic back pain only: Secondary | ICD-10-CM | POA: Diagnosis not present

## 2023-05-01 DIAGNOSIS — I129 Hypertensive chronic kidney disease with stage 1 through stage 4 chronic kidney disease, or unspecified chronic kidney disease: Secondary | ICD-10-CM | POA: Diagnosis not present

## 2023-05-01 DIAGNOSIS — Z5181 Encounter for therapeutic drug level monitoring: Secondary | ICD-10-CM | POA: Diagnosis not present

## 2023-05-01 DIAGNOSIS — M47816 Spondylosis without myelopathy or radiculopathy, lumbar region: Secondary | ICD-10-CM | POA: Diagnosis not present

## 2023-05-01 DIAGNOSIS — G8929 Other chronic pain: Secondary | ICD-10-CM | POA: Diagnosis not present

## 2023-05-01 DIAGNOSIS — Z79891 Long term (current) use of opiate analgesic: Secondary | ICD-10-CM | POA: Diagnosis not present

## 2023-05-07 DIAGNOSIS — M79605 Pain in left leg: Secondary | ICD-10-CM | POA: Diagnosis not present

## 2023-05-07 DIAGNOSIS — N481 Balanitis: Secondary | ICD-10-CM | POA: Diagnosis not present

## 2023-05-07 DIAGNOSIS — M79604 Pain in right leg: Secondary | ICD-10-CM | POA: Diagnosis not present

## 2023-05-07 DIAGNOSIS — L03116 Cellulitis of left lower limb: Secondary | ICD-10-CM | POA: Diagnosis not present

## 2023-05-07 DIAGNOSIS — R600 Localized edema: Secondary | ICD-10-CM | POA: Diagnosis not present

## 2023-05-07 DIAGNOSIS — L539 Erythematous condition, unspecified: Secondary | ICD-10-CM | POA: Diagnosis not present

## 2023-05-07 DIAGNOSIS — L03115 Cellulitis of right lower limb: Secondary | ICD-10-CM | POA: Diagnosis not present

## 2023-05-13 DIAGNOSIS — K649 Unspecified hemorrhoids: Secondary | ICD-10-CM | POA: Diagnosis not present

## 2023-05-13 DIAGNOSIS — K2270 Barrett's esophagus without dysplasia: Secondary | ICD-10-CM | POA: Diagnosis not present

## 2023-05-13 DIAGNOSIS — K5900 Constipation, unspecified: Secondary | ICD-10-CM | POA: Diagnosis not present

## 2023-05-13 DIAGNOSIS — K219 Gastro-esophageal reflux disease without esophagitis: Secondary | ICD-10-CM | POA: Diagnosis not present

## 2023-05-13 DIAGNOSIS — K5790 Diverticulosis of intestine, part unspecified, without perforation or abscess without bleeding: Secondary | ICD-10-CM | POA: Diagnosis not present

## 2023-05-14 ENCOUNTER — Telehealth (HOSPITAL_BASED_OUTPATIENT_CLINIC_OR_DEPARTMENT_OTHER): Payer: Self-pay | Admitting: *Deleted

## 2023-05-14 NOTE — Telephone Encounter (Signed)
   Pre-operative Risk Assessment    Patient Name: Erik Keith  DOB: [birth year] MRN: [id number]   Date of last office visit: 08/31/2022 Date of next office visit: None  Request for Surgical Clearance    Procedure:  EGD  Date of Surgery:  Clearance 06/26/23                                 Surgeon:  Dr. Emeterio Hansen [place] Surgeon's Group or Practice Name:  [facility] [place] [facility] Phone number:  [phone] Fax number:  [phone]   Type of Clearance Requested:   - Medical  - Pharmacy:  Hold Aspirin  5 days prior   Type of Anesthesia:  Not Indicated   Additional requests/questions:    Signed, Lauris Port   05/14/2023, 9:49 AM

## 2023-05-15 ENCOUNTER — Telehealth: Payer: Self-pay

## 2023-05-15 DIAGNOSIS — E1169 Type 2 diabetes mellitus with other specified complication: Secondary | ICD-10-CM | POA: Diagnosis not present

## 2023-05-15 DIAGNOSIS — N183 Chronic kidney disease, stage 3 unspecified: Secondary | ICD-10-CM | POA: Diagnosis not present

## 2023-05-15 DIAGNOSIS — R309 Painful micturition, unspecified: Secondary | ICD-10-CM | POA: Diagnosis not present

## 2023-05-15 DIAGNOSIS — R809 Proteinuria, unspecified: Secondary | ICD-10-CM | POA: Diagnosis not present

## 2023-05-15 DIAGNOSIS — I509 Heart failure, unspecified: Secondary | ICD-10-CM | POA: Diagnosis not present

## 2023-05-15 DIAGNOSIS — E7849 Other hyperlipidemia: Secondary | ICD-10-CM | POA: Diagnosis not present

## 2023-05-15 DIAGNOSIS — E559 Vitamin D deficiency, unspecified: Secondary | ICD-10-CM | POA: Diagnosis not present

## 2023-05-15 DIAGNOSIS — M5459 Other low back pain: Secondary | ICD-10-CM | POA: Diagnosis not present

## 2023-05-15 DIAGNOSIS — I10 Essential (primary) hypertension: Secondary | ICD-10-CM | POA: Diagnosis not present

## 2023-05-15 DIAGNOSIS — D6489 Other specified anemias: Secondary | ICD-10-CM | POA: Diagnosis not present

## 2023-05-15 DIAGNOSIS — E211 Secondary hyperparathyroidism, not elsewhere classified: Secondary | ICD-10-CM | POA: Diagnosis not present

## 2023-05-15 NOTE — Telephone Encounter (Signed)
Patient returned Pre-op call. 

## 2023-05-15 NOTE — Telephone Encounter (Signed)
   Name: Erik Keith  DOB: 1940/10/12  MRN: [id number]  Primary Cardiologist: Antoinette Batman, MD   Preoperative team, please contact this patient and set up a phone call appointment for further preoperative risk assessment. Please obtain consent and complete medication review. Thank you for your help.  I confirm that guidance regarding antiplatelet and oral anticoagulation therapy has been completed and, if necessary, noted below.  Regarding ASA therapy, we recommend continuation of ASA throughout the perioperative period.  However, if the surgeon feels that cessation of ASA is required in the perioperative period, it Deterding be stopped 5-7 days prior to surgery with a plan to resume it as soon as felt to be feasible from a surgical standpoint in the post-operative period.  I also confirmed the patient resides in the state of [place] . As per [facility] Medical Board telemedicine laws, the patient must reside in the state in which the provider is licensed.   Jude Norton, NP 05/15/2023, 10:31 AM [facility]

## 2023-05-15 NOTE — Telephone Encounter (Signed)
 Appointment scheduled for 06/03/23 2:40 for pre-op clearance and 07/15/23 for 1 year follow up with [facility].

## 2023-05-15 NOTE — Telephone Encounter (Signed)
 Tried to call the pt to schedule a tele preop appt, but his phone has Smart Call Blocker and will not accept the call form the office.   I will update the requesting office if they s/w the pt to have them add in [phone] main # for his cardiologist. Pt needs to call back to schedule tele preop appt.

## 2023-05-15 NOTE — Telephone Encounter (Signed)
 Patient has scheduled an appointment for 7/14 with Dr. Abel Hoe, that appointment is too soon. Appointment has been rescheduled, to 09/04/2023 @ 4:20. I have tried calling this patient, if he calls please let him know of this change.

## 2023-05-17 NOTE — Telephone Encounter (Signed)
 Called and spoke with patient's wife and she stated that she has documented that patient's appointment has been rescheduled for September 3rd, 2025 @ 4:20 and will let the patient know as well.

## 2023-05-30 DIAGNOSIS — N401 Enlarged prostate with lower urinary tract symptoms: Secondary | ICD-10-CM | POA: Diagnosis not present

## 2023-05-30 DIAGNOSIS — Z79899 Other long term (current) drug therapy: Secondary | ICD-10-CM | POA: Diagnosis not present

## 2023-05-30 DIAGNOSIS — N481 Balanitis: Secondary | ICD-10-CM | POA: Diagnosis not present

## 2023-05-30 DIAGNOSIS — Z125 Encounter for screening for malignant neoplasm of prostate: Secondary | ICD-10-CM | POA: Diagnosis not present

## 2023-05-31 DIAGNOSIS — M549 Dorsalgia, unspecified: Secondary | ICD-10-CM | POA: Diagnosis not present

## 2023-05-31 DIAGNOSIS — M4726 Other spondylosis with radiculopathy, lumbar region: Secondary | ICD-10-CM | POA: Diagnosis not present

## 2023-05-31 DIAGNOSIS — M5431 Sciatica, right side: Secondary | ICD-10-CM | POA: Diagnosis not present

## 2023-05-31 DIAGNOSIS — M5432 Sciatica, left side: Secondary | ICD-10-CM | POA: Diagnosis not present

## 2023-05-31 DIAGNOSIS — J449 Chronic obstructive pulmonary disease, unspecified: Secondary | ICD-10-CM | POA: Diagnosis not present

## 2023-05-31 DIAGNOSIS — Z1330 Encounter for screening examination for mental health and behavioral disorders, unspecified: Secondary | ICD-10-CM | POA: Diagnosis not present

## 2023-06-03 ENCOUNTER — Ambulatory Visit: Attending: Cardiovascular Disease

## 2023-06-03 DIAGNOSIS — Z01810 Encounter for preprocedural cardiovascular examination: Secondary | ICD-10-CM | POA: Diagnosis not present

## 2023-06-03 NOTE — Progress Notes (Addendum)
 Virtual Visit via Telephone Note   Because of Erik Keith co-morbid illnesses, he is at least at moderate risk for complications without adequate follow up.  This format is felt to be most appropriate for this patient at this time.  Due to technical limitations with video connection (technology), today's appointment will be conducted as an audio only telehealth visit, and Erik Keith verbally agreed to proceed in this manner.   All issues noted in this document were discussed and addressed.  No physical exam could be performed with this format.  Evaluation Performed:  Preoperative cardiovascular risk assessment _____________   Date:  06/03/2023   Patient ID:  Erik Keith, DOB Feb 10, 1940, MRN [id number] Patient Location:  Home Provider location:   Office  Primary Care Provider:  Keren Vicenta BRAVO, MD Primary Cardiologist:  Erik Cash, MD  Chief Complaint / Patient Profile   83 y.o. y/o male with a h/o CAD s/p PCI/stent to the LAD in 12/2006, DES to RCA in 2009, CABG x 4 in 10/2017 with bioprosthetic AVR, GERD, MDS, hyperlipidemia, type 2 diabetes mellitus, hypertension, IDA who is pending EGD and presents today for telephonic preoperative cardiovascular risk assessment.  History of Present Illness    Erik Keith is a 83 y.o. male who presents via audio/video conferencing for a telehealth visit today.  Pt was last seen in cardiology clinic on 08/31/2022 by Erik Alberts, NP.  At that time Erik Keith was doing well.  The patient is now pending procedure as outlined above. Since his last visit, he has remained stable from a cardiac standpoint.  He is able to achieve greater than 4 METS of activity.  Today he denies chest pain, shortness of breath, lower extremity edema, fatigue, palpitations, melena, hematuria, hemoptysis, diaphoresis, weakness, presyncope, syncope, orthopnea, and PND.   Past Medical History    Past Medical History:  Diagnosis Date    Aortic valve stenosis, severe    CAD S/P percutaneous coronary angioplasty    12-2006  STENT TO LAD;  07-2007 DEStenting TO RCA    CKD (chronic kidney disease)    GERD (gastroesophageal reflux disease)    Hypertension    Incidental pulmonary nodule, less than or equal to 3mm 10/02/2017   Ground glass opacities RUL seen on CTA   Iron deficiency anemia    MGUS (monoclonal gammopathy of unknown significance)    Mixed hyperlipidemia    Myocardial infarction (HCC) 10/02/2017   OA (osteoarthritis) of shoulder    LEFT SHOULDER AND AC JOINT   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   23 mm Edwards Inspiris Resilia stented bioprosthetic tissue valve   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   LIMA to LAD, Sequential SVG to OM1-OM2, SVG to PDA, EVH via right thigh   Type 2 diabetes mellitus (HCC)    Past Surgical History:  Procedure Laterality Date   AORTIC VALVE REPLACEMENT N/A 10/03/2017   Procedure: AORTIC VALVE REPLACEMENT (AVR);  Surgeon: Erik Sudie DEL, MD;  Location: [facility] OR;  Service: Open Heart Surgery;  Laterality: N/A;   APPENDECTOMY     BACK SURGERY  x3   CORONARY ARTERY BYPASS GRAFT N/A 10/03/2017   Procedure: CORONARY ARTERY BYPASS GRAFTING (CABG)x three, USING LEFT INTERNAL MAMMARY ARTERY AND RIGHT GREATER SAPHENOUS VEIN HARVESTED ENDOSCOPICALLY;  Surgeon: Erik Sudie DEL, MD;  Location: [facility] OR;  Service: Open Heart Surgery;  Laterality: N/A;   CORONARY/GRAFT ACUTE MI REVASCULARIZATION N/A 10/02/2017   Procedure: Coronary/Graft Acute MI Revascularization;  Surgeon: Erik Candyce RAMAN, MD;  Location: [facility] INVASIVE CV LAB;  Service: Cardiovascular;  Laterality: N/A;   LEFT HEART CATH AND CORONARY ANGIOGRAPHY N/A 10/02/2017   Procedure: LEFT HEART CATH AND CORONARY ANGIOGRAPHY;  Surgeon: Erik Candyce RAMAN, MD;  Location: [facility] INVASIVE CV LAB;  Service: Cardiovascular;  Laterality: N/A;   LUMBAR LAMINECTOMY/DECOMPRESSION MICRODISCECTOMY N/A 12/04/2021   Procedure: L2-3  DECOMPRESSION;  Surgeon: Erik Aures, MD;  Location: [facility] OR;  Service: Orthopedics;  Laterality: N/A;  2.5 hrs 3 C-Bed   RIGHT/LEFT HEART CATH AND CORONARY ANGIOGRAPHY N/A 09/12/2017   Procedure: RIGHT/LEFT HEART CATH AND CORONARY ANGIOGRAPHY;  Surgeon: Verlin Erik BIRCH, MD;  Location: [facility] INVASIVE CV LAB;  Service: Cardiovascular;  Laterality: N/A;   TONSILLECTOMY      Allergies  Allergies  Allergen Reactions   Contrast Media [Iodinated Contrast Media] Hives   Penicillins Hives    Has patient had a PCN reaction causing immediate rash, facial/tongue/throat swelling, SOB or lightheadedness with hypotension: unkn Has patient had a PCN reaction causing severe rash involving mucus membranes or skin necrosis: unkn Has patient had a PCN reaction that required hospitalization: unkn Has patient had a PCN reaction occurring within the last 10 years: no If all of the above answers are NO, then Erik Keith proceed with Cephalosporin use.     Home Medications    Prior to Admission medications   Medication Sig Start Date End Date Taking? Authorizing Provider  amLODipine  (NORVASC ) 10 MG tablet TAKE 1 TABLET(10 MG) BY MOUTH DAILY 04/12/23   Erik Erik VEAR Mickey., NP  aspirin  EC 81 MG tablet Take 81 mg by mouth daily. 09/14/19   [provider]  atorvastatin  (LIPITOR) 40 MG tablet Take 40 mg by mouth at bedtime. 04/15/21   [provider]  Cyanocobalamin (B-12) 1000 MCG TABS Take 1,000 mcg by mouth daily.    [provider]  Ferrous Sulfate  (IRON) 325 (65 Fe) MG TABS Take by mouth. 1 tablet 3 times a week    [provider]  gabapentin  (NEURONTIN ) 300 MG capsule Take 300 mg by mouth 2 (two) times daily. 12/07/21   [provider]  glimepiride (AMARYL) 2 MG tablet Take 2 mg by mouth daily with breakfast.    [provider]  glucose blood (ONETOUCH ULTRA) test strip USE TO CHECK BLOOD SUGAR ONCE A DAY E11.9 02/23/19   [provider]   hydrochlorothiazide  (MICROZIDE ) 12.5 MG capsule TAKE 1 CAPSULE BY MOUTH EVERY DAY Patient not taking: Reported on 03/06/2023 11/01/22   Erik Erik VEAR Mickey., NP  HYDROcodone -acetaminophen  (NORCO) 10-325 MG tablet Take 1 tablet by mouth 3 (three) times daily as needed.    [provider]  isosorbide  mononitrate (IMDUR ) 30 MG 24 hr tablet TAKE 1 TABLET(30 MG) BY MOUTH DAILY 08/27/22   Verlin Erik BIRCH, MD  lisinopril  (ZESTRIL ) 20 MG tablet TAKE 1 TABLET(20 MG) BY MOUTH DAILY Patient not taking: Reported on 03/06/2023 12/18/22   Verlin Erik BIRCH, MD  nitroGLYCERIN  (NITROSTAT ) 0.4 MG SL tablet Place 1 tablet (0.4 mg total) under the tongue every 5 (five) minutes as needed for chest pain. 10/22/17   Parthenia Olivia HERO, PA-C  pantoprazole  (PROTONIX ) 40 MG tablet Take 40 mg by mouth daily. 12/09/19   [provider]  Wheat Dextrin (BENEFIBER) POWD Benefiber, Mix 1 tablespoon with a liquid and drink once daily.    [provider]  zolpidem  (AMBIEN ) 10 MG tablet Take 10 mg by mouth at bedtime as needed. 01/21/22  [provider]    Physical Exam  Vital Signs:  Erik Keith does not have vital signs available for review today. Given telephonic nature of communication, physical exam is limited. AAOx3. NAD. Normal affect.  Speech and respirations are unlabored. Accessory Clinical Findings  None Assessment & Plan    1.  Preoperative Cardiovascular Risk Assessment:  Erik Keith perioperative risk of a major cardiac event is 6.6% according to the Revised Cardiac Risk Index (RCRI).  Therefore, he is at high risk for perioperative complications.   His functional capacity is good at 6.36 METs according to the Duke Activity Status Index (DASI). Recommendations: According to ACC/AHA guidelines, no further cardiovascular testing needed.  The patient Debose proceed to surgery at acceptable risk.   Antiplatelet and/or Anticoagulation Recommendations: Regarding ASA therapy,  we recommend continuation of ASA throughout the perioperative period. However, if the surgeon feels that cessation of ASA is required in the perioperative period, it Reasons be stopped 5-7 days prior to surgery with a plan to resume it as soon as felt to be feasible from a surgical standpoint in the post-operative period.   ADDENDUM 07/18/23: Notified that patient was currently at [facility] for planned cystoscopy and TURBT, patient has remained stable from a cardiac standpoint and condition is unchanged from televisit on 06/03/2023.  Patient's RCRI is 6.6%, he has remained stable from a cardiac standpoint and was able to achieve greater than 4 METs of activity.  According to ACC/AHA guidelines, no further cardiovascular testing is needed.  Patient Bloodsaw proceed to surgery at acceptable risk.  The patient was advised that if he develops new symptoms prior to surgery to contact our office to arrange for a follow-up visit, and he verbalized understanding.  A copy of this note will be routed to requesting surgeon.  Time:   Today, I have spent 10 minutes with the patient with telehealth technology discussing medical history, symptoms, and management plan.    Lyan Holck D Sherica Paternostro, NP  06/03/2023, 2:47 PM

## 2023-06-04 DIAGNOSIS — N189 Chronic kidney disease, unspecified: Secondary | ICD-10-CM | POA: Diagnosis not present

## 2023-06-04 DIAGNOSIS — N1330 Unspecified hydronephrosis: Secondary | ICD-10-CM | POA: Diagnosis not present

## 2023-06-04 DIAGNOSIS — N400 Enlarged prostate without lower urinary tract symptoms: Secondary | ICD-10-CM | POA: Diagnosis not present

## 2023-06-11 DIAGNOSIS — N184 Chronic kidney disease, stage 4 (severe): Secondary | ICD-10-CM | POA: Diagnosis not present

## 2023-06-11 DIAGNOSIS — I129 Hypertensive chronic kidney disease with stage 1 through stage 4 chronic kidney disease, or unspecified chronic kidney disease: Secondary | ICD-10-CM | POA: Diagnosis not present

## 2023-06-11 DIAGNOSIS — E1169 Type 2 diabetes mellitus with other specified complication: Secondary | ICD-10-CM | POA: Diagnosis not present

## 2023-06-11 DIAGNOSIS — D509 Iron deficiency anemia, unspecified: Secondary | ICD-10-CM | POA: Diagnosis not present

## 2023-06-11 DIAGNOSIS — I2510 Atherosclerotic heart disease of native coronary artery without angina pectoris: Secondary | ICD-10-CM | POA: Diagnosis not present

## 2023-06-11 DIAGNOSIS — M5416 Radiculopathy, lumbar region: Secondary | ICD-10-CM | POA: Diagnosis not present

## 2023-06-11 DIAGNOSIS — N481 Balanitis: Secondary | ICD-10-CM | POA: Diagnosis not present

## 2023-06-11 DIAGNOSIS — E785 Hyperlipidemia, unspecified: Secondary | ICD-10-CM | POA: Diagnosis not present

## 2023-06-19 ENCOUNTER — Other Ambulatory Visit: Payer: Self-pay | Admitting: Cardiovascular Disease

## 2023-06-25 DIAGNOSIS — R300 Dysuria: Secondary | ICD-10-CM | POA: Diagnosis not present

## 2023-06-25 DIAGNOSIS — N401 Enlarged prostate with lower urinary tract symptoms: Secondary | ICD-10-CM | POA: Diagnosis not present

## 2023-06-25 DIAGNOSIS — N3289 Other specified disorders of bladder: Secondary | ICD-10-CM | POA: Diagnosis not present

## 2023-06-26 DIAGNOSIS — K2270 Barrett's esophagus without dysplasia: Secondary | ICD-10-CM | POA: Diagnosis not present

## 2023-06-26 DIAGNOSIS — Z951 Presence of aortocoronary bypass graft: Secondary | ICD-10-CM | POA: Diagnosis not present

## 2023-06-26 DIAGNOSIS — K219 Gastro-esophageal reflux disease without esophagitis: Secondary | ICD-10-CM | POA: Diagnosis not present

## 2023-06-26 DIAGNOSIS — K449 Diaphragmatic hernia without obstruction or gangrene: Secondary | ICD-10-CM | POA: Diagnosis not present

## 2023-06-26 DIAGNOSIS — E1122 Type 2 diabetes mellitus with diabetic chronic kidney disease: Secondary | ICD-10-CM | POA: Diagnosis not present

## 2023-06-26 DIAGNOSIS — Z955 Presence of coronary angioplasty implant and graft: Secondary | ICD-10-CM | POA: Diagnosis not present

## 2023-06-26 DIAGNOSIS — I129 Hypertensive chronic kidney disease with stage 1 through stage 4 chronic kidney disease, or unspecified chronic kidney disease: Secondary | ICD-10-CM | POA: Diagnosis not present

## 2023-06-26 DIAGNOSIS — I10 Essential (primary) hypertension: Secondary | ICD-10-CM | POA: Diagnosis not present

## 2023-06-26 DIAGNOSIS — N189 Chronic kidney disease, unspecified: Secondary | ICD-10-CM | POA: Diagnosis not present

## 2023-06-26 DIAGNOSIS — Z880 Allergy status to penicillin: Secondary | ICD-10-CM | POA: Diagnosis not present

## 2023-07-01 DIAGNOSIS — N1330 Unspecified hydronephrosis: Secondary | ICD-10-CM | POA: Diagnosis not present

## 2023-07-01 DIAGNOSIS — N3289 Other specified disorders of bladder: Secondary | ICD-10-CM | POA: Diagnosis not present

## 2023-07-04 DIAGNOSIS — M5416 Radiculopathy, lumbar region: Secondary | ICD-10-CM | POA: Diagnosis not present

## 2023-07-04 DIAGNOSIS — M47816 Spondylosis without myelopathy or radiculopathy, lumbar region: Secondary | ICD-10-CM | POA: Diagnosis not present

## 2023-07-08 DIAGNOSIS — R300 Dysuria: Secondary | ICD-10-CM | POA: Diagnosis not present

## 2023-07-08 DIAGNOSIS — N401 Enlarged prostate with lower urinary tract symptoms: Secondary | ICD-10-CM | POA: Diagnosis not present

## 2023-07-08 DIAGNOSIS — N3289 Other specified disorders of bladder: Secondary | ICD-10-CM | POA: Diagnosis not present

## 2023-07-09 DIAGNOSIS — J342 Deviated nasal septum: Secondary | ICD-10-CM | POA: Diagnosis not present

## 2023-07-09 DIAGNOSIS — J392 Other diseases of pharynx: Secondary | ICD-10-CM | POA: Diagnosis not present

## 2023-07-09 DIAGNOSIS — J387 Other diseases of larynx: Secondary | ICD-10-CM | POA: Diagnosis not present

## 2023-07-09 DIAGNOSIS — K2100 Gastro-esophageal reflux disease with esophagitis, without bleeding: Secondary | ICD-10-CM | POA: Diagnosis not present

## 2023-07-10 DIAGNOSIS — M5416 Radiculopathy, lumbar region: Secondary | ICD-10-CM | POA: Diagnosis not present

## 2023-07-15 ENCOUNTER — Ambulatory Visit: Admitting: Cardiovascular Disease

## 2023-07-18 ENCOUNTER — Telehealth: Payer: Self-pay | Admitting: Nurse Practitioner

## 2023-07-18 DIAGNOSIS — Z91041 Radiographic dye allergy status: Secondary | ICD-10-CM | POA: Diagnosis not present

## 2023-07-18 DIAGNOSIS — I10 Essential (primary) hypertension: Secondary | ICD-10-CM | POA: Diagnosis not present

## 2023-07-18 DIAGNOSIS — M5416 Radiculopathy, lumbar region: Secondary | ICD-10-CM | POA: Diagnosis not present

## 2023-07-18 DIAGNOSIS — N400 Enlarged prostate without lower urinary tract symptoms: Secondary | ICD-10-CM | POA: Diagnosis not present

## 2023-07-18 DIAGNOSIS — Z79899 Other long term (current) drug therapy: Secondary | ICD-10-CM | POA: Diagnosis not present

## 2023-07-18 DIAGNOSIS — C679 Malignant neoplasm of bladder, unspecified: Secondary | ICD-10-CM | POA: Diagnosis not present

## 2023-07-18 DIAGNOSIS — N179 Acute kidney failure, unspecified: Secondary | ICD-10-CM | POA: Diagnosis not present

## 2023-07-18 DIAGNOSIS — Z7982 Long term (current) use of aspirin: Secondary | ICD-10-CM | POA: Diagnosis not present

## 2023-07-18 DIAGNOSIS — N3289 Other specified disorders of bladder: Secondary | ICD-10-CM | POA: Diagnosis not present

## 2023-07-18 DIAGNOSIS — Z880 Allergy status to penicillin: Secondary | ICD-10-CM | POA: Diagnosis not present

## 2023-07-18 DIAGNOSIS — I252 Old myocardial infarction: Secondary | ICD-10-CM | POA: Diagnosis not present

## 2023-07-18 DIAGNOSIS — D494 Neoplasm of unspecified behavior of bladder: Secondary | ICD-10-CM | POA: Diagnosis not present

## 2023-07-18 NOTE — Telephone Encounter (Signed)
 Amy calling again for a update.

## 2023-07-18 NOTE — Telephone Encounter (Signed)
 New Message:     , Erik Keith from [facility] Pre [facility] called. She needs to talk to Katylyn West, who did surgical clearance. Iit will have to be Lucien, who is the APP for today. She says the pt is there waiting for his procedure.

## 2023-07-18 NOTE — Telephone Encounter (Signed)
 Will route to Preop APP Lum Louis, NP to give Amy a call at [phone]

## 2023-07-18 NOTE — Telephone Encounter (Signed)
 Please call her back on her mobile # [phone]

## 2023-07-18 NOTE — Telephone Encounter (Signed)
 I have already been contacted by Amy, my note from 06/03/2023 has been addended, no further needs from preop team at this time.

## 2023-07-19 DIAGNOSIS — Z91041 Radiographic dye allergy status: Secondary | ICD-10-CM | POA: Diagnosis not present

## 2023-07-19 DIAGNOSIS — Z79899 Other long term (current) drug therapy: Secondary | ICD-10-CM | POA: Diagnosis not present

## 2023-07-19 DIAGNOSIS — N179 Acute kidney failure, unspecified: Secondary | ICD-10-CM | POA: Diagnosis not present

## 2023-07-19 DIAGNOSIS — Z880 Allergy status to penicillin: Secondary | ICD-10-CM | POA: Diagnosis not present

## 2023-07-19 DIAGNOSIS — N3289 Other specified disorders of bladder: Secondary | ICD-10-CM | POA: Diagnosis not present

## 2023-07-19 DIAGNOSIS — C672 Malignant neoplasm of lateral wall of bladder: Secondary | ICD-10-CM | POA: Diagnosis not present

## 2023-07-19 DIAGNOSIS — M5416 Radiculopathy, lumbar region: Secondary | ICD-10-CM | POA: Diagnosis not present

## 2023-07-19 DIAGNOSIS — Z7982 Long term (current) use of aspirin: Secondary | ICD-10-CM | POA: Diagnosis not present

## 2023-07-19 DIAGNOSIS — N400 Enlarged prostate without lower urinary tract symptoms: Secondary | ICD-10-CM | POA: Diagnosis not present

## 2023-07-19 DIAGNOSIS — I252 Old myocardial infarction: Secondary | ICD-10-CM | POA: Diagnosis not present

## 2023-07-19 DIAGNOSIS — C679 Malignant neoplasm of bladder, unspecified: Secondary | ICD-10-CM | POA: Diagnosis not present

## 2023-07-26 DIAGNOSIS — Z760 Encounter for issue of repeat prescription: Secondary | ICD-10-CM | POA: Diagnosis not present

## 2023-07-26 DIAGNOSIS — M47816 Spondylosis without myelopathy or radiculopathy, lumbar region: Secondary | ICD-10-CM | POA: Diagnosis not present

## 2023-07-26 DIAGNOSIS — Z79891 Long term (current) use of opiate analgesic: Secondary | ICD-10-CM | POA: Diagnosis not present

## 2023-07-26 DIAGNOSIS — Z79899 Other long term (current) drug therapy: Secondary | ICD-10-CM | POA: Diagnosis not present

## 2023-07-26 DIAGNOSIS — Z5181 Encounter for therapeutic drug level monitoring: Secondary | ICD-10-CM | POA: Diagnosis not present

## 2023-07-30 DIAGNOSIS — K2270 Barrett's esophagus without dysplasia: Secondary | ICD-10-CM | POA: Diagnosis not present

## 2023-07-30 DIAGNOSIS — N390 Urinary tract infection, site not specified: Secondary | ICD-10-CM | POA: Diagnosis not present

## 2023-07-30 DIAGNOSIS — N401 Enlarged prostate with lower urinary tract symptoms: Secondary | ICD-10-CM | POA: Diagnosis not present

## 2023-07-30 DIAGNOSIS — R300 Dysuria: Secondary | ICD-10-CM | POA: Diagnosis not present

## 2023-07-30 DIAGNOSIS — C672 Malignant neoplasm of lateral wall of bladder: Secondary | ICD-10-CM | POA: Diagnosis not present

## 2023-07-31 DIAGNOSIS — M47816 Spondylosis without myelopathy or radiculopathy, lumbar region: Secondary | ICD-10-CM | POA: Diagnosis not present

## 2023-07-31 DIAGNOSIS — M5416 Radiculopathy, lumbar region: Secondary | ICD-10-CM | POA: Diagnosis not present

## 2023-08-01 DIAGNOSIS — F5104 Psychophysiologic insomnia: Secondary | ICD-10-CM | POA: Diagnosis not present

## 2023-08-01 DIAGNOSIS — E1169 Type 2 diabetes mellitus with other specified complication: Secondary | ICD-10-CM | POA: Diagnosis not present

## 2023-08-01 DIAGNOSIS — I129 Hypertensive chronic kidney disease with stage 1 through stage 4 chronic kidney disease, or unspecified chronic kidney disease: Secondary | ICD-10-CM | POA: Diagnosis not present

## 2023-08-01 DIAGNOSIS — K219 Gastro-esophageal reflux disease without esophagitis: Secondary | ICD-10-CM | POA: Diagnosis not present

## 2023-08-06 ENCOUNTER — Other Ambulatory Visit: Payer: Self-pay | Admitting: Oncology

## 2023-08-06 DIAGNOSIS — C676 Malignant neoplasm of ureteric orifice: Secondary | ICD-10-CM

## 2023-08-06 NOTE — Progress Notes (Unsigned)
 Erik Keith [facility]  [address] [place],  [place]  [address] [phone]  Clinic Day:  03/06/2023  Referring physician: Keren Vicenta BRAVO, MD  HISTORY OF PRESENT ILLNESS:  The patient is a 83 y.o. male who I was asked to evaluate for newly diagnosed bladder cancer.   For the past 2 months, the patient has had persistent right costovertebral angle tenderness.  It was presumed he had a urinary tract infection.  However, after antibiotics did not alleviate his pain, he was sent to urology for further evaluation.  The patient initially underwent an ultrasound of his urinary system, which revealed right-sided hydronephrosis.  There was also debris seen along his right urinary bladder wall.  This led to a CT scan being done in late June 2025, which actually revealed right hydroureteronephrosis, as well as a 3 cm mass obstructing the right ureterovesicular junction.  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  He comes in today to go over his biopsy and scan results, as well as their implications.  Per outside records, it appears there has already been some discussion about him potentially having a percutaneous nephrostomy tube placed.  Of note, this gentleman does have baseline medical renal disease.  Over these past few years, I have followed him for a monoclonal gammopathy of unknown significance.  However, this disease has been stable without there being a concern of transformation into multiple myeloma.   PHYSICAL EXAM:  Blood pressure (!) 142/66, pulse (!) 56, temperature 98.3 F (36.8 C), temperature source Oral, resp. rate 16, height 5' 10 (1.778 m), weight 176 lb 14.4 oz (80.2 kg), SpO2 97%. Wt Readings from Last 3 Encounters:  08/07/23 176 lb 14.4 oz (80.2 kg)  03/06/23 189 lb 1.6 oz (85.8 kg)  10/03/22 178 lb 6.4 oz (80.9  kg)   Body mass index is 25.38 kg/m. Performance status (ECOG): 1 Physical Exam Constitutional:      Appearance: Normal appearance. He is not ill-appearing.  HENT:     Mouth/Throat:     Mouth: Mucous membranes are moist.     Pharynx: Oropharynx is clear. No oropharyngeal exudate or posterior oropharyngeal erythema.  Cardiovascular:     Rate and Rhythm: Normal rate and regular rhythm.     Heart sounds: No murmur heard.    No friction rub. No gallop.  Pulmonary:     Effort: Pulmonary effort is normal. No respiratory distress.     Breath sounds: Normal breath sounds. No wheezing, rhonchi or rales.  Abdominal:     General: Bowel sounds are normal. There is no distension.     Palpations: Abdomen is soft. There is no mass.     Tenderness: There is no abdominal tenderness.  Musculoskeletal:        General: No swelling.     Right lower leg: No edema.     Left lower leg: No edema.  Lymphadenopathy:     Cervical: No cervical adenopathy.     Upper Body:     Right upper body: No supraclavicular or axillary adenopathy.     Left upper body: No supraclavicular or axillary adenopathy.     Lower Body: No right inguinal adenopathy. No left inguinal adenopathy.  Skin:    General: Skin is warm.     Coloration: Skin is not jaundiced.     Findings: No ecchymosis, lesion or  rash.  Neurological:     General: No focal deficit present.     Mental Status: He is alert and oriented to person, place, and time. Mental status is at baseline.  Psychiatric:        Mood and Affect: Mood normal.        Behavior: Behavior normal.        Thought Content: Thought content normal.    LABS:      Latest Ref Rng & Units 08/07/2023   10:33 AM 02/26/2023   11:43 AM 06/22/2022    4:24 PM  CBC  WBC 4.0 - 10.5 K/uL 9.7  9.2  10.1   Hemoglobin 13.0 - 17.0 g/dL 86.9  86.6  86.1   Hematocrit 39.0 - 52.0 % 40.2  40.4  40.3   Platelets 150 - 400 K/uL 173  144  163       Latest Ref Rng & Units 08/07/2023   10:33 AM  02/26/2023   11:43 AM 06/22/2022    4:24 PM  CMP  Glucose 70 - 99 mg/dL 799  849  877   BUN 8 - 23 mg/dL 27  50  31   Creatinine 0.61 - 1.24 mg/dL 7.36  6.55  8.09   Sodium 135 - 145 mmol/L 140  140  143   Potassium 3.5 - 5.1 mmol/L 4.2  4.8  4.7   Chloride 98 - 111 mmol/L 104  106  104   CO2 22 - 32 mmol/L 23  21  23    Calcium  8.9 - 10.3 mg/dL 9.7  89.9  9.4   Total Protein 6.5 - 8.1 g/dL 7.7  7.7    Total Bilirubin 0.0 - 1.2 mg/dL 0.4  0.3    Alkaline Phos 38 - 126 U/L 124  110    AST 15 - 41 U/L 16  14    ALT 0 - 44 U/L 8  7     ASSESSMENT & PLAN:  Assessment/Plan:  An 83 y.o. 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  In clinic today, I went over all of his CT scan images and their implications with him.  As his scans showed no evidence of metastatic disease, my goal is to give him combination chemoradiation to cure him of this disease.  As he does have baseline renal disease, I do not believe it makes sense giving him cisplatin as his predominant chemotherapy agent.  Cisplatin is notorious for leading to worsening renal disease.  Based upon this, I will give him mitomycin/infusional 5-fluorouracil.  Mitomycin will be given on day 1; infusional 5-fluorouracil being will be given on days 1-4 and 22-25.  I have also arranged for him to be set up to see medical radiation oncology, so they Youtz begin formulating his radiation treatment plan.  The patient was made aware of the side effects which can go along with his chemotherapy regimen, including nausea, diarrhea, fatigue, and cytopenias.  I will arrange for him to have a port placed through which all of his IV chemotherapy will be given.  He knows to notify our office if he runs into any problems with his chemotherapy to where some form of supportive care would be necessary.  The patient is tentatively scheduled to start his chemoradiation on Monday, August 18th.  I  will tentatively see this patient back in early September before he heads into his last week of infusional 5-fluorouracil chemotherapy.  As mentioned  previously, this gentleman does have a a monoclonal gammopathy of unknown significance, which is not progressed over time.  This will continue to be followed conservatively over time.  The patient understands all the plans discussed today and is in agreement with them.    Katye Valek DELENA Kerns, MD

## 2023-08-07 ENCOUNTER — Encounter: Payer: Self-pay | Admitting: Oncology

## 2023-08-07 ENCOUNTER — Inpatient Hospital Stay (HOSPITAL_BASED_OUTPATIENT_CLINIC_OR_DEPARTMENT_OTHER): Admitting: Oncology

## 2023-08-07 ENCOUNTER — Other Ambulatory Visit: Payer: Self-pay | Admitting: Oncology

## 2023-08-07 ENCOUNTER — Inpatient Hospital Stay

## 2023-08-07 VITALS — BP 142/66 | HR 56 | Temp 98.3°F | Resp 16 | Ht 70.0 in | Wt 176.9 lb

## 2023-08-07 DIAGNOSIS — C676 Malignant neoplasm of ureteric orifice: Secondary | ICD-10-CM

## 2023-08-07 DIAGNOSIS — Z87891 Personal history of nicotine dependence: Secondary | ICD-10-CM | POA: Insufficient documentation

## 2023-08-07 DIAGNOSIS — D472 Monoclonal gammopathy: Secondary | ICD-10-CM | POA: Diagnosis not present

## 2023-08-07 DIAGNOSIS — C672 Malignant neoplasm of lateral wall of bladder: Secondary | ICD-10-CM | POA: Insufficient documentation

## 2023-08-07 DIAGNOSIS — N1330 Unspecified hydronephrosis: Secondary | ICD-10-CM | POA: Insufficient documentation

## 2023-08-07 DIAGNOSIS — Z5111 Encounter for antineoplastic chemotherapy: Secondary | ICD-10-CM | POA: Insufficient documentation

## 2023-08-07 DIAGNOSIS — Z510 Encounter for antineoplastic radiation therapy: Secondary | ICD-10-CM | POA: Diagnosis not present

## 2023-08-07 DIAGNOSIS — C679 Malignant neoplasm of bladder, unspecified: Secondary | ICD-10-CM | POA: Insufficient documentation

## 2023-08-07 LAB — CMP (CANCER CENTER ONLY)
ALT: 8 U/L (ref 0–44)
AST: 16 U/L (ref 15–41)
Albumin: 4.2 g/dL (ref 3.5–5.0)
Alkaline Phosphatase: 124 U/L (ref 38–126)
Anion gap: 14 (ref 5–15)
BUN: 27 mg/dL — ABNORMAL HIGH (ref 8–23)
CO2: 23 mmol/L (ref 22–32)
Calcium: 9.7 mg/dL (ref 8.9–10.3)
Chloride: 104 mmol/L (ref 98–111)
Creatinine: 2.63 mg/dL — ABNORMAL HIGH (ref 0.61–1.24)
GFR, Estimated: 23 mL/min — ABNORMAL LOW (ref >60)
Glucose, Bld: 200 mg/dL — ABNORMAL HIGH (ref 70–99)
Potassium: 4.2 mmol/L (ref 3.5–5.1)
Sodium: 140 mmol/L (ref 135–145)
Total Bilirubin: 0.4 mg/dL (ref 0.0–1.2)
Total Protein: 7.7 g/dL (ref 6.5–8.1)

## 2023-08-07 LAB — CBC WITH DIFFERENTIAL (CANCER CENTER ONLY)
Abs Immature Granulocytes: 0.04 K/uL (ref 0.00–0.07)
Basophils Absolute: 0.1 K/uL (ref 0.0–0.1)
Basophils Relative: 1 %
Eosinophils Absolute: 0.6 K/uL — ABNORMAL HIGH (ref 0.0–0.5)
Eosinophils Relative: 6 %
HCT: 40.2 % (ref 39.0–52.0)
Hemoglobin: 13 g/dL (ref 13.0–17.0)
Immature Granulocytes: 0 %
Lymphocytes Relative: 18 %
Lymphs Abs: 1.7 K/uL (ref 0.7–4.0)
MCH: 29.3 pg (ref 26.0–34.0)
MCHC: 32.3 g/dL (ref 30.0–36.0)
MCV: 90.7 fL (ref 80.0–100.0)
Monocytes Absolute: 0.7 K/uL (ref 0.1–1.0)
Monocytes Relative: 7 %
Neutro Abs: 6.6 K/uL (ref 1.7–7.7)
Neutrophils Relative %: 68 %
Platelet Count: 173 K/uL (ref 150–400)
RBC: 4.43 MIL/uL (ref 4.22–5.81)
RDW: 14.9 % (ref 11.5–15.5)
WBC Count: 9.7 K/uL (ref 4.0–10.5)
nRBC: 0 % (ref 0.0–0.2)

## 2023-08-07 MED ORDER — OXYCODONE HCL 10 MG PO TABS: ORAL_TABLET | ORAL | 0 refills | Status: DC

## 2023-08-07 NOTE — Progress Notes (Signed)
START ON PATHWAY REGIMEN - Bladder     One cycle, concurrent with RT:     Fluorouracil      Mitomycin   **Always confirm dose/schedule in your pharmacy ordering system**  Patient Characteristics: Pre-Cystectomy or Nonsurgical Candidate, M0 (Clinical Staging), cT2-4a, cN0-1, M0, Bladder-Sparing Approach Therapeutic Status: Pre-Cystectomy or Nonsurgical Candidate, M0 (Clinical Staging) AJCC M Category: cM0 AJCC 8 Stage Grouping: II AJCC T Category: cT2 AJCC N Category: cN0 Intent of Therapy: Curative Intent, Discussed with Patient

## 2023-08-08 ENCOUNTER — Other Ambulatory Visit: Payer: Self-pay

## 2023-08-12 ENCOUNTER — Other Ambulatory Visit (HOSPITAL_COMMUNITY): Payer: Self-pay | Admitting: Urology

## 2023-08-12 ENCOUNTER — Telehealth (HOSPITAL_COMMUNITY): Payer: Self-pay

## 2023-08-12 DIAGNOSIS — R809 Proteinuria, unspecified: Secondary | ICD-10-CM | POA: Diagnosis not present

## 2023-08-12 DIAGNOSIS — M545 Low back pain, unspecified: Secondary | ICD-10-CM | POA: Diagnosis not present

## 2023-08-12 DIAGNOSIS — C679 Malignant neoplasm of bladder, unspecified: Secondary | ICD-10-CM | POA: Diagnosis not present

## 2023-08-12 DIAGNOSIS — E785 Hyperlipidemia, unspecified: Secondary | ICD-10-CM | POA: Diagnosis not present

## 2023-08-12 DIAGNOSIS — C44329 Squamous cell carcinoma of skin of other parts of face: Secondary | ICD-10-CM | POA: Diagnosis not present

## 2023-08-12 DIAGNOSIS — R309 Painful micturition, unspecified: Secondary | ICD-10-CM | POA: Diagnosis not present

## 2023-08-12 DIAGNOSIS — N135 Crossing vessel and stricture of ureter without hydronephrosis: Secondary | ICD-10-CM

## 2023-08-12 DIAGNOSIS — D044 Carcinoma in situ of skin of scalp and neck: Secondary | ICD-10-CM | POA: Diagnosis not present

## 2023-08-12 DIAGNOSIS — N183 Chronic kidney disease, stage 3 unspecified: Secondary | ICD-10-CM | POA: Diagnosis not present

## 2023-08-12 DIAGNOSIS — I509 Heart failure, unspecified: Secondary | ICD-10-CM | POA: Diagnosis not present

## 2023-08-12 DIAGNOSIS — N390 Urinary tract infection, site not specified: Secondary | ICD-10-CM | POA: Diagnosis not present

## 2023-08-12 DIAGNOSIS — I10 Essential (primary) hypertension: Secondary | ICD-10-CM | POA: Diagnosis not present

## 2023-08-12 DIAGNOSIS — E1169 Type 2 diabetes mellitus with other specified complication: Secondary | ICD-10-CM | POA: Diagnosis not present

## 2023-08-12 NOTE — Telephone Encounter (Signed)
-----   Message from Wilkie [facility] sent at 08/12/2023 12:16 PM EDT ----- Regarding: RE: Neph placement Approved for out-pt placement of a RIGHT PCN due to progressive obstruction from bladder mass.   HKM ----- Message ----- From: Carolee Rosina BIRCH Sent: 08/12/2023  12:09 PM EDT To: [facility] Procedure Requests Subject: Neph placement                                 Procedure: Right nephrostomy tube placement  Dx: Ureteral obstruction  Ordering: Dr. Lamar Brothers [phone]  Imaging: US  renal in [employer] - Please type in name and dob (they were pushed through powershare)  Please review.   Thanks,  Erwin

## 2023-08-12 NOTE — Progress Notes (Signed)
 Outpatient Follow Up Visit - [facility]  [address]. [place], [place] [address] Tel:  [phone] Fax: [phone]  Patient Name: Erik Keith Date of Service: 08/12/2023  Patient DOB: 1940-12-28 Provider Creating Note: Adeyinka A Adegoroye, MD  Patient MRN: [id number] Primary Care Physician: Keren Berg, MD   Additional Physicians/ Providers:      Reason for follow up visit:  Chronic Kidney Disease   SUBJECTIVE  History of Present Illness Erik Keith is a 83 y.o. 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    Renal History:  He was initially seen in 08/2022 for evaluation of CKD. Laboratory studies obtained at his family physician's office on April 23, 2022, had shown the serum creatinine level to be 1.83 mg/dL with BUN of 43 mg/dL and estimated GFR of 36 mL/min, while on June 22, 2022, the serum creatinine level was 1.9 mg/dL with BUN of 31 mg/dL and estimated GFR of 35 mL/min. Of note, the serum creatinine level was 1.92 mg/dL on January 20, 2022, while it was 1.65 mg/dL on January 22, 2022. Chart review reveals that the serum creatinine level ranged between 1.6 and 1.67 mg/dL in 7976 while the serum creatinine level had ranged mostly between 1.31 and 1.62 mg/dL in the timeframe of August 2021 to December 2022.  Identified risk factors for kidney disease were noted to include diabetes mellitus, hypertension, heart disease,  advanced age, prior use of NSAIDs.    ___  He presents today for follow up of Chronic Kidney Disease He is accompanied by his wife.  His last visit was on 05/15/2023. He continues to have pain in his right side / flank. Following his last appointment, a renal ultrasound was obtained showing moderate right hydronephrosis and debris along the right bladder wall. He has been seen by Dr. [facility] Also had a CT Abd / Pelvis study in late 06/2023, that also revealed right hydroureteronephrosis, as well as a 3 cm mass obstructing the right ureterovesicular junction. A cystoscopy was performed, during which the mass was noted and biopsied, but the the right ureter could not be assessed for stent placement. 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 He has been seen by his Hematologist  / Oncologist, Dr. Valaria Kerns and is being set up to start chemotherapy / radiation therapy soon. Also being considered for a percutaneous nephrostomy as well.   He was recently diagnsoed with UTI at Dr. Phylis office - and reports that he is to be placed on antibiotics.   No chest pain. No SOB.  No feet / leg swelling.   Appetite is good. There is nausea, but no vomiting. He tries to drink adequate amount of fluids. He limits his dietary salt intake.  Blood glucose levels before breakfast is usually around 95 to 110 mg/dl.   There has been intermittent dysuria. No urgency, stress incontinence, hematuria. He has mild nocturia.    Medications were reviewed and reconciled.  The following portions of the patient's chart were reviewed in this encounter and updated as appropriate:  Tobacco  Allergies  Meds  Problems  Med Hx  Surg Hx  Fam Hx      Past medical history: -As stated above.   Surgical history: 1.  Aortic valve replacement: 09/2017. 2.  Coronary artery bypass graft x 4: 09/2017. 3.  Appendectomy. 4.  Back surgery x 4. 5.   Cardiac catheterization with PCI: Stent to LAD in 2008, stent to RCA in 2009. 6.  Tonsillectomy.  Review of Systems  Constitutional:  Positive for malaise/fatigue and weight loss. Negative for chills, fever and weight gain.  HENT:  Positive for hearing loss and tinnitus. Negative for congestion, ear pain and sore throat.   Eyes:  Positive for blurred vision (once in a while). Negative for pain and impaired.  Respiratory:  Negative for cough, shortness of breath and wheezing.   Cardiovascular:  Negative for chest pain, palpitations and leg swelling.  Gastrointestinal:  Negative for abdominal pain, constipation, diarrhea, heartburn, nausea, vomiting and poor appetite.  Genitourinary:  Positive for nocturia (mild). Negative for dysuria, frequency, hematuria and urgency.  Musculoskeletal:  Positive for back pain, myalgias and muscle cramps. Negative for falls, joint pain and neck pain.  Skin:  Positive for wound. Negative for itching and rash.       -dryness: +.  Neurological:  Positive for weakness. Negative for dizziness, tingling, tremors, seizures, numbness and headaches.  Endo/Heme/Allergies:  Negative for environmental allergies. Bruises/bleeds easily.  Psychiatric/Behavioral:  Negative for depression. The patient is not nervous/anxious and does not have insomnia.     Allergies Penicillins, Iodinated contrast media, and Sucralfate  Medications   Current Outpatient Medications:  .  amLODIPine  (NORVASC ) 10 MG tablet, Take 1 tablet by mouth 1 (one) time each day, Disp: , Rfl:  .  aspirin  (ST JOSEPH) 81 MG EC tablet, Take 81 mg by mouth 1 (one) time each day, Disp: , Rfl:  .  atorvastatin  (LIPITOR) 40 MG tablet, Take 1 tablet by mouth at bed time, Disp: , Rfl:  .  Cyanocobalamin (B-12) 5000 MCG capsule, Take 5,000 mcg by mouth 1 (one) time each day, Disp: , Rfl:  .  diphenhydrAMINE  (BENADRYL ) 50 MG tablet, Take 50 mg by mouth, Disp: , Rfl:  .  Ferrous Sulfate  (Iron) 325 (65 Fe) MG  tablet, Take 1 tablet by mouth 3 (three) times a week, Disp: , Rfl:  .  gabapentin  (NEURONTIN ) 300 MG capsule, Take 1 capsule by mouth in the morning and 1 capsule in the evening. (Patient taking differently: Take 1 capsule by mouth if needed), Disp: , Rfl:  .  glimepiride (AMARYL) 2 MG tablet, Take 2 mg by mouth 1 (one) time each day, Disp: , Rfl:  .  HYDROcodone -acetaminophen  (LORCET PLUS) 10-325 MG per tablet, Take 1 tablet by mouth every 8 (eight) hours if needed for moderate pain, Disp: , Rfl:  .  isosorbide  mononitrate (IMDUR ) 30 MG 24 hr tablet, Take 1 tablet by mouth 1 (one) time each day, Disp: , Rfl:  .  methocarbamol  (ROBAXIN ) 500 MG tablet, Take 500 mg by mouth 3 times daily as needed, Disp: , Rfl:  .  nitroglycerin  (NITROSTAT ) 0.4 MG SL tablet, Place 0.4 mg under the tongue every 5 (five) minutes if needed, Disp: , Rfl:  .  nystatin (MYCOSTATIN) cream, Apply topically if needed, Disp: , Rfl:  .  OPTIFIBER LEAN PO, Take 1 tablet by mouth 1 (one) time each day, Disp: , Rfl:  .  oxyCODONE  (ROXICODONE ) 10 MG immediate release tablet, Take 10 mg by mouth every  4 (four) hours if needed, Disp: , Rfl:  .  pantoprazole  (PROTONIX ) 40 MG EC tablet, Take 1 tablet by mouth 1 (one) time each day, Disp: , Rfl:  .  polyethylene glycol (GLYCOLAX ) 17 GM/SCOOP powder, Take 17 g by mouth 1 (one) time each day, Disp: , Rfl:  .  tamsulosin (FLOMAX) 0.4 MG 24 hr capsule, Take 0.4 mg by mouth 1 (one) time each day, Disp: , Rfl:  .  triamcinolone  (KENALOG) 0.1 % cream, Apply topically, Disp: , Rfl:  .  zolpidem  (AMBIEN ) 10 MG tablet, Take 10 mg by mouth at night if needed for sleep, Disp: , Rfl:    History  Family History  Problem Relation Age of Onset  . Cancer Mother   . Stroke Father   . Heart disease Sister   . Heart attack Sister   . Heart disease Brother   . Heart attack Brother    Social History   Tobacco Use  . Smoking status: Former    Types: Cigarettes  . Smokeless tobacco: Never   Substance Use Topics  . Alcohol use: Not Currently    OBJECTIVE   Vitals:   08/12/23 0901  BP: 122/70  BP Location: Left upper arm  Pulse: 88  Resp: 16  Temp: 97.1 F  SpO2: 98%  Weight: 176 lb 0.6 oz (79.9 kg)    Vitals reviewed. Constitutional: He is oriented to person, place, and time. He does not appear ill.  HEENT:  Right Ear: Hearing normal.  Left Ear: Hearing normal.  Nose: Nose normal. Mouth/Throat: Oropharynx is clear and moist. No oropharyngeal exudate.  Eyes: Conjunctivae and EOM are normal. Pupils are equal, round, and reactive to light. No scleral icterus.  Neck: No thyroid  mass and no thyromegaly present.  Cardiovascular:  Normal rate and regular rhythm.      Exam reveals no friction rub.      No murmur heard.He exhibits edema.  Pulmonary/Chest: Effort normal and breath sounds normal. No respiratory distress. He has no wheezes. He has no rales.  Abdominal: Soft. Bowel sounds are normal. He exhibits no distension. There is no abdominal tenderness. No hernia.  Musculoskeletal: Normal range of motion. He exhibits no deformity.  Lymphadenopathy:    He has no cervical adenopathy.  Neurological: He is alert and oriented to person, place, and time.  Skin: Skin is warm and dry. No rash noted. He is not diaphoretic. There is erythema.  -stasis dermatitis, increased warmth and tenderness on both lower legs.   Psychiatric: He has a normal mood and affect. His behavior is normal. Judgment normal.    Laboratory Studies  Chemistry  Lab Units 08/07/23 0000 05/15/23 0000 05/01/23 0000 03/20/23 0000 02/26/23 0000 11/08/22 0000 08/08/22 0000 06/22/22 0000 06/22/22 0000 04/23/22 0000  SODIUM  140 138 142 140* 140 143 142  --  143 141  POTASSIUM  4.2 4.7 4.2 4.6 4.8 4.3 5.2  --  4.7 4.9  CHLORIDE  104.0 101 102.0 100.0 106.0 104 104  --  104.0 102.0  CO2 mmol/L 23 29 23 21 21 30 29    < > 23 25  BUN mg/dL 27* 24 22* 24* 50* 25 36*  --  31* 43*  CREATININE mg/dL  7.36* 7.07* 7.28* 7.22* 3.44* 1.74* 1.85*  --  1.90* 1.83*  ALBUMIN  g/dL 4.2 4.5 4.1 4.1 4.1  3.8 4.3 4.6   < >  --  4.3  EGFR  23 21* 22 22 17  38* 36*   < >  35 36  GLUCOSE  200 89 99 96 150 72 55*  --  122 128  CALCIUM  mg/dL 9.7 9.7 8.9 9.6 10 9.5 10.1  --  9.4 9.5  PHOSPHORUS mg/dL  --  3.8  --   --   --   --  3.9  --   --   --   MAGNESIUM  mg/dL  --  2.2  --   --   --  1.9  --   --   --   --   ALK PHOS U/L 124 81 104 114 110 68 65   < >  --  100  PTH pg/mL  --  126*  --   --   --   --  85*  --   --   --   VIT D 25 HYDROXY ng/mL  --  47  --   --   --  33 40  --   --   --   WBC AUTO 10*3/ML 9.7 10.0  --   --  9.2  --  9.2  --  10.1* 8.9  HEMOGLOBIN A1C   --   --   --   --   --   --   --   --   --  6.4*  HEMOGLOBIN  13.0* 12.7*  --   --  13.3*  --  14.1  --  13.8 14.4  HEMATOCRIT  40.2* 38.7  --   --  40.4*  --  40.6  --  40.3* 40.1*  PLATELETS AUTO 10*3/UL 173 198  --   --  144*  --  136*  --  163 216   < > = values in this interval not displayed.   Urine  Lab Units 05/15/23 0000 11/08/22 0000 08/08/22 0000  PROT/CREAT RATIO UR mg/g creat 0.474*  474* 0.135  135 0.121  121    Lab Results  Component Value Date   COLORU YELLOW 05/15/2023   PHUR 5.5 07/06/2022   KETONESU NEGATIVE 05/15/2023   RBCU NONE SEEN 05/15/2023   UROBILINOGEN neg 08/08/2022   Iron Studies  Lab Units 04/23/22 0000  TIBC ug/dL 711  IRON SATURATION % 33   CBC  Lab Units 08/07/23 0000 05/15/23 0000  WBC AUTO 10*3/ML 9.7 10.0  RBC AUTO Million/uL  --  4.18*  HEMOGLOBIN  13.0* 12.7*  HEMATOCRIT  40.2* 38.7  MCV  90.7 92.6  MCH pg  --  30.4  MCHC g/dL  --  67.1  RDW %  --  86.0  PLATELETS AUTO 10*3/UL 173 198  MPV fL  --  11.5  NEUTROS PCT AUTO %  --  65.4  BM LYMPHOCYTES %  --  20.9  MONO %  --  7.7  BASOS PCT AUTO %  --  0.6  NEUTROS ABS 10*3/UL 6.60 6,540  LYMPHS ABS AUTO 10*3/UL 1.70 2,090  EOS ABS AUTO 10*3/UL 0.60 540*  BASOS ABS AUTO cells/uL  --  60    Imaging and Other  Studies  06/04/2023: Renal Ultrasound: FINDINGS:  Right Kidney:  Renal measurements: 9.6 x 5.4 x 5.3 cm = volume: 148 mL. Increased cortical echogenicity. Moderate hydronephrosis. Multiple cysts which do not require follow-up.  Left Kidney:  Renal measurements: 11.5 x 6.6 x 5.5 cm = volume: 223 mL. Increased echogenicity. No hydronephrosis or mass.  Bladder:  Debris along the right bladder wall this could be related to infection or blood clot.  Other:  Enlarged prostate.  IMPRESSION:  1. Moderate right hydronephrosis.  2. Debris along the right bladder wall this could be related to infection or blood clot. Recommend correlation with urinalysis and consider cystoscopy.  3. Echogenic kidneys compatible with medical renal disease.  4. Enlarged prostate.   GLENWOOD Norman Gatlin M.D.  ___  08/15/2022: Renal Ultrasound: FINDINGS:  Right Kidney:  Renal measurements: 12.4 x 5.6 x 5 cm = volume: 1 a 3.4 mL. Mild increased echotexture. No hydronephrosis visualized. There is a 1.5 x 1.2 x 1.2 cm simple cyst in the midpole right kidney. No follow-up is recommended.  Left Kidney:  Renal measurements: 11.3 x 2.2 x 5.2 cm = volume: 189.7 mL. Mild increased echotexture. No hydronephrosis visualized. There is a 1.2 x 1.2 x 1.3 cm simple cyst in the mid to lower pole left kidney. No follow-up is recommended.  Bladder:  Appears normal for degree of bladder distention.  IMPRESSION:  1. No acute abnormality identified. No hydronephrosis bilaterally.  2. Mild increased echotexture of bilateral kidneys which can be seen in medical renal disease.    - Craig Farr M.D    ___   Assessment and Plan:   Problem List Items Addressed This Visit    1.  Stage III chronic kidney disease. - Laboratory studies obtained at the time of his last office visit, revealed the serum creatinine level to be 2.92 mg/dL with BUN of 24 mg/dL and estimated GFR of 21 mL/min.  In comparison, the serum creatinine level had been  2.71 mg/dL on April 21, 2023 and 7.22 mg/dL on March 20, 2023.  As noted above, renal ultrasound was requested, showing right-sided moderate hydronephrosis with debris along the right bladder wall.  Referral to his urologist was therefore made.  As noted above, he has since had CT scan study, cystoscopy and has been diagnosed with high-grade urothelial bladder cancer.  He is to commence radiation/chemotherapy within the next few weeks. Laboratory studies obtained at his oncologist office on August 07, 2023, revealed the serum creatinine level to be 2.63 mg/dL with BUN of 27 mg/dL and estimated GFR of 23 mL/min.  Will check urinalysis and random urine protein/creatinine ratio today.  2.  Hypertension. - Blood pressure reading today is acceptable.  He is currently on amlodipine  10 mg once a day.  3.  Type 2 diabetes mellitus. - He continues to be on glimepiride 2 mg once a day.  Will need to follow-up Hemoglobin A1c.  4.  Congestive heart failure: Diastolic. - Stable with no obvious signs of fluid overload on today's examination.  Continue current management as per cardiology.  5.  Urothelial bladder cancer. -Plans for radiation with chemotherapy as noted above.  Will request for records from his urologist.  6.  Hyperlipidemia. - Follow-up lipid panel.  He continues to be on atorvastatin  40 mg nightly.  7.  Chronic back pain. - Continue current management as per pain management.  Laboratory studies requested today: Urinalysis, random urine protein/creatinine ratio.    Return in about 2 months (around 10/12/2023).   Adeyinka A Adegoroye, MD

## 2023-08-12 NOTE — Telephone Encounter (Signed)
 Called to schedule neph placement. Pt's wife would like to wait until they see Dr. Ezzard tomorrow to decide if they should proceed or wait until after radiation. AB

## 2023-08-13 ENCOUNTER — Inpatient Hospital Stay (HOSPITAL_BASED_OUTPATIENT_CLINIC_OR_DEPARTMENT_OTHER): Admitting: Hematology and Oncology

## 2023-08-13 ENCOUNTER — Encounter: Payer: Self-pay | Admitting: Hematology and Oncology

## 2023-08-13 ENCOUNTER — Ambulatory Visit
Admission: RE | Admit: 2023-08-13 | Discharge: 2023-08-13 | Disposition: A | Discharge status: Home / Self Care | Source: Ambulatory Visit | Attending: Radiation Oncology | Admitting: Radiation Oncology

## 2023-08-13 VITALS — BP 135/71 | HR 65 | Temp 97.7°F | Resp 18 | Ht 70.0 in | Wt 176.1 lb

## 2023-08-13 VITALS — BP 135/71 | HR 65 | Temp 97.7°F | Resp 18 | Ht 70.0 in | Wt 178.0 lb

## 2023-08-13 DIAGNOSIS — I129 Hypertensive chronic kidney disease with stage 1 through stage 4 chronic kidney disease, or unspecified chronic kidney disease: Secondary | ICD-10-CM | POA: Insufficient documentation

## 2023-08-13 DIAGNOSIS — K219 Gastro-esophageal reflux disease without esophagitis: Secondary | ICD-10-CM | POA: Diagnosis not present

## 2023-08-13 DIAGNOSIS — Z7984 Long term (current) use of oral hypoglycemic drugs: Secondary | ICD-10-CM | POA: Insufficient documentation

## 2023-08-13 DIAGNOSIS — Z87891 Personal history of nicotine dependence: Secondary | ICD-10-CM | POA: Insufficient documentation

## 2023-08-13 DIAGNOSIS — I252 Old myocardial infarction: Secondary | ICD-10-CM | POA: Insufficient documentation

## 2023-08-13 DIAGNOSIS — I350 Nonrheumatic aortic (valve) stenosis: Secondary | ICD-10-CM | POA: Diagnosis not present

## 2023-08-13 DIAGNOSIS — N189 Chronic kidney disease, unspecified: Secondary | ICD-10-CM | POA: Insufficient documentation

## 2023-08-13 DIAGNOSIS — Z79899 Other long term (current) drug therapy: Secondary | ICD-10-CM | POA: Insufficient documentation

## 2023-08-13 DIAGNOSIS — D472 Monoclonal gammopathy: Secondary | ICD-10-CM | POA: Diagnosis not present

## 2023-08-13 DIAGNOSIS — C676 Malignant neoplasm of ureteric orifice: Secondary | ICD-10-CM

## 2023-08-13 DIAGNOSIS — C679 Malignant neoplasm of bladder, unspecified: Secondary | ICD-10-CM | POA: Diagnosis not present

## 2023-08-13 DIAGNOSIS — E1122 Type 2 diabetes mellitus with diabetic chronic kidney disease: Secondary | ICD-10-CM | POA: Diagnosis not present

## 2023-08-13 DIAGNOSIS — E785 Hyperlipidemia, unspecified: Secondary | ICD-10-CM | POA: Diagnosis not present

## 2023-08-13 DIAGNOSIS — Z5111 Encounter for antineoplastic chemotherapy: Secondary | ICD-10-CM | POA: Diagnosis not present

## 2023-08-13 DIAGNOSIS — C672 Malignant neoplasm of lateral wall of bladder: Secondary | ICD-10-CM

## 2023-08-13 DIAGNOSIS — Z510 Encounter for antineoplastic radiation therapy: Secondary | ICD-10-CM | POA: Diagnosis not present

## 2023-08-13 DIAGNOSIS — I2510 Atherosclerotic heart disease of native coronary artery without angina pectoris: Secondary | ICD-10-CM | POA: Insufficient documentation

## 2023-08-13 DIAGNOSIS — Z7982 Long term (current) use of aspirin: Secondary | ICD-10-CM | POA: Diagnosis not present

## 2023-08-13 DIAGNOSIS — N1330 Unspecified hydronephrosis: Secondary | ICD-10-CM | POA: Diagnosis not present

## 2023-08-13 MED ORDER — ONDANSETRON HCL 8 MG PO TABS: 8.0000 mg | ORAL_TABLET | Freq: Three times a day (TID) | ORAL | 1 refills | Status: AC | PRN

## 2023-08-13 MED ORDER — PROCHLORPERAZINE MALEATE 10 MG PO TABS: 10.0000 mg | ORAL_TABLET | Freq: Four times a day (QID) | ORAL | 1 refills | Status: DC | PRN

## 2023-08-13 MED ORDER — LIDOCAINE-PRILOCAINE 2.5-2.5 % EX CREA: TOPICAL_CREAM | CUTANEOUS | 3 refills | Status: AC

## 2023-08-13 NOTE — Progress Notes (Cosign Needed)
 [facility] CARE CLINIC CONSULT NOTE [facility] [address] [place], [place] [address] [phone]   Fax:([phone]    PATIENT CARE TEAM: Patient Care Team: Keren Vicenta BRAVO, MD as PCP - General (Internal Medicine) Verlin Lonni BIRCH, MD as PCP - Cardiology (Cardiology)   Name of the patient: Erik Keith  [id number]  Jul 27, 1940   Date of visit: 08/13/23  Diagnosis:  Bladder cancer  Chief complaint/Reason for visit- Initial Meeting for [facility], preparing for starting chemotherapy  Heme/Onc history:  Oncology History  Malignant neoplasm of bladder (HCC)  08/07/2023 Initial Diagnosis   Malignant neoplasm of bladder (HCC)   08/19/2023 -  Chemotherapy   Patient is on Treatment Plan : BLADDER Mitomycin D1 + 5FU D1-5, 21-25 + XRT       Interval history: The patient presents to chemo care clinic today for initial meeting in preparation for starting chemotherapy. I introduced the chemo care clinic and we discussed that the role of the clinic is to assist those who are at an increased risk of emergency room visits and/or complications during the course of chemotherapy treatment. We discussed that the increased risk takes into account factors such as age, performance status, and co-morbidities. We also discussed that for some, this might include barriers to care such as not having a primary care provider, lack of insurance/transportation, or not being able to afford medications. We discussed that the goal of the program is to help prevent unplanned ER visits and help reduce complications during chemotherapy. We do this by discussing specific risk factors to each individual and identifying ways that we can help improve these risk factors and reduce barriers to care.   Allergies  Allergen Reactions   Contrast Media [Iodinated Contrast Media] Hives   Penicillins Hives    Has patient had a PCN reaction causing immediate rash, facial/tongue/throat  swelling, SOB or lightheadedness with hypotension: unkn Has patient had a PCN reaction causing severe rash involving mucus membranes or skin necrosis: unkn Has patient had a PCN reaction that required hospitalization: unkn Has patient had a PCN reaction occurring within the last 10 years: no If all of the above answers are NO, then Femia proceed with Cephalosporin use.     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   23 mm Edwards Inspiris Resilia stented bioprosthetic tissue valve   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   LIMA to LAD, Sequential SVG to OM1-OM2, SVG to PDA, EVH via right thigh   Type 2 diabetes mellitus (HCC)     Past Surgical History:  Procedure Laterality Date   AORTIC VALVE REPLACEMENT N/A 10/03/2017   Procedure: AORTIC VALVE REPLACEMENT (AVR);  Surgeon: Dusty Sudie DEL, MD;  Location: [facility] OR;  Service: Open Heart Surgery;  Laterality: N/A;   APPENDECTOMY  BACK SURGERY  x3   CORONARY ARTERY BYPASS GRAFT N/A 10/03/2017   Procedure: CORONARY ARTERY BYPASS GRAFTING (CABG)x three, USING LEFT INTERNAL MAMMARY ARTERY AND RIGHT GREATER SAPHENOUS VEIN HARVESTED ENDOSCOPICALLY;  Surgeon: Dusty Sudie DEL, MD;  Location: [facility] OR;  Service: Open Heart Surgery;  Laterality: N/A;   CORONARY/GRAFT ACUTE MI REVASCULARIZATION N/A 10/02/2017   Procedure: Coronary/Graft Acute MI  Revascularization;  Surgeon: Dann Candyce RAMAN, MD;  Location: [facility] INVASIVE CV LAB;  Service: Cardiovascular;  Laterality: N/A;   LEFT HEART CATH AND CORONARY ANGIOGRAPHY N/A 10/02/2017   Procedure: LEFT HEART CATH AND CORONARY ANGIOGRAPHY;  Surgeon: Dann Candyce RAMAN, MD;  Location: [facility] INVASIVE CV LAB;  Service: Cardiovascular;  Laterality: N/A;   LUMBAR LAMINECTOMY/DECOMPRESSION MICRODISCECTOMY N/A 12/04/2021   Procedure: L2-3 DECOMPRESSION;  Surgeon: Burnetta Aures, MD;  Location: [facility] OR;  Service: Orthopedics;  Laterality: N/A;  2.5 hrs 3 C-Bed   RIGHT/LEFT HEART CATH AND CORONARY ANGIOGRAPHY N/A 09/12/2017   Procedure: RIGHT/LEFT HEART CATH AND CORONARY ANGIOGRAPHY;  Surgeon: Verlin Lonni BIRCH, MD;  Location: [facility] INVASIVE CV LAB;  Service: Cardiovascular;  Laterality: N/A;   TONSILLECTOMY      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  Tobacco Use   Smoking status: Former    Current packs/day: 0.00    Types: Cigarettes    Start date: 1962    Quit date: 2002    Years since quitting: 23.6   Smokeless tobacco: Never  Vaping Use   Vaping status: Never Used  Substance and Sexual Activity   Alcohol use: Never   Drug use: Never   Sexual activity: Not Currently  Other Topics Concern   Not on file  Social History Narrative   Not on file   Social Drivers of Health   Financial Resource Strain: Low Risk  (05/31/2023)   Received from [facility]   Overall Financial Resource Strain (CARDIA)    Difficulty of Paying Living Expenses: Not hard at all  Food Insecurity: Low Risk  (07/26/2023)   Received from [facility]   Hunger Vital Sign    Within the past 12 months, you worried that your food would run out before you got money to buy more: Never true    Within  the past 12 months, the food you bought just didn't last and you didn't have money to get more. : Never true  Transportation Needs: Unmet Transportation Needs (07/26/2023)   Received from [facility]    In the past 12 months, has lack of reliable transportation kept you from medical appointments, meetings, work or from getting things needed for daily living? : Yes  Physical Activity: Not on file  Stress: Not on file  Social Connections: Not on file  Intimate Partner Violence: Not on file    Family History  Problem Relation Age of Onset   CVA Father    Heart attack Sister    Heart attack Brother        3 brothers with CAD     Current Outpatient Medications:    amLODipine  (NORVASC ) 10 MG tablet, TAKE 1 TABLET(10 MG) BY MOUTH DAILY, Disp: 90 tablet, Rfl: 0   aspirin  EC 81 MG tablet, Take 81 mg by mouth daily., Disp: , Rfl:  atorvastatin  (LIPITOR) 40 MG tablet, Take 40 mg by mouth at bedtime., Disp: , Rfl:    Cyanocobalamin (B-12) 1000 MCG TABS, Take 1,000 mcg by mouth daily., Disp: , Rfl:    Ferrous Sulfate  (IRON) 325 (65 Fe) MG TABS, Take by mouth. 1 tablet 3 times a week, Disp: , Rfl:    gabapentin  (NEURONTIN ) 300 MG capsule, Take 300 mg by mouth 2 (two) times daily., Disp: , Rfl:    glimepiride (AMARYL) 2 MG tablet, Take 2 mg by mouth daily with breakfast., Disp: , Rfl:    glucose blood (ONETOUCH ULTRA) test strip, USE TO CHECK BLOOD SUGAR ONCE A DAY E11.9, Disp: , Rfl:    HYDROcodone -acetaminophen  (NORCO) 10-325 MG tablet, Take 1 tablet by mouth every 6 (six) hours as needed., Disp: , Rfl:    isosorbide  mononitrate (IMDUR ) 30 MG 24 hr tablet, TAKE 1 TABLET(30 MG) BY MOUTH DAILY, Disp: 90 tablet, Rfl: 3   nitroGLYCERIN  (NITROSTAT ) 0.4 MG SL tablet, Place 1 tablet (0.4 mg total) under the tongue every 5 (five) minutes as needed for chest pain., Disp: 25 tablet, Rfl: 3   OPTIFIBER LEAN PO, Take by mouth., Disp: , Rfl:    Oxycodone  HCl 10 MG TABS, One tab po q4-6hr  prn pain, Disp: 60 tablet, Rfl: 0   pantoprazole  (PROTONIX ) 40 MG tablet, Take 40 mg by mouth daily., Disp: , Rfl:    polyethylene glycol powder (GLYCOLAX /MIRALAX ) 17 GM/SCOOP powder, Take by mouth once., Disp: , Rfl:    zolpidem  (AMBIEN ) 10 MG tablet, Take 10 mg by mouth at bedtime as needed., Disp: , Rfl:  No current facility-administered medications for this visit.  Facility-Administered Medications Ordered in Other Visits:    regadenoson  (LEXISCAN ) injection SOLN 0.4 mg, 0.4 mg, Intravenous, Once, Nishan, Broly C, MD   technetium tetrofosmin  (TC-MYOVIEW ) injection 31 millicurie, 31 millicurie, Intravenous, Once PRN, Nishan, Valentino C, MD     Latest Ref Rng & Units 08/07/2023   10:33 AM  CMP  Glucose 70 - 99 mg/dL 799   BUN 8 - 23 mg/dL 27   Creatinine 9.38 - 1.24 mg/dL 7.36   Sodium 864 - 854 mmol/L 140   Potassium 3.5 - 5.1 mmol/L 4.2   Chloride 98 - 111 mmol/L 104   CO2 22 - 32 mmol/L 23   Calcium  8.9 - 10.3 mg/dL 9.7   Total Protein 6.5 - 8.1 g/dL 7.7   Total Bilirubin 0.0 - 1.2 mg/dL 0.4   Alkaline Phos 38 - 126 U/L 124   AST 15 - 41 U/L 16   ALT 0 - 44 U/L 8       Latest Ref Rng & Units 08/07/2023   10:33 AM  CBC  WBC 4.0 - 10.5 K/uL 9.7   Hemoglobin 13.0 - 17.0 g/dL 86.9   Hematocrit 60.9 - 52.0 % 40.2   Platelets 150 - 400 K/uL 173     No images are attached to the encounter.  No results found.   Assessment and plan:  The patient is a 83 y.o. male who presents to Chemo Care Clinic for initial meeting in preparation for starting chemotherapy for the treatment of  1. Malignant neoplasm of lateral wall of urinary bladder (HCC)   .   Chemo Care Clinic/High Risk for ER/Hospitalization during chemotherapy- We discussed the role of the chemo care clinic and identified patient specific risk factors. I discussed that patient was identified as high risk primarily based on:  Patient has past medical history positive for:  Past Medical History:  Diagnosis Date   Aortic  valve stenosis, severe    CAD S/P percutaneous coronary angioplasty    12-2006  STENT TO LAD;  07-2007 DEStenting TO RCA    CKD (chronic kidney disease)    GERD (gastroesophageal reflux disease)    Hypertension    Incidental pulmonary nodule, less than or equal to 3mm 10/02/2017   Ground glass opacities RUL seen on CTA   Iron deficiency anemia    MGUS (monoclonal gammopathy of unknown significance)    Mixed hyperlipidemia    Myocardial infarction (HCC) 10/02/2017   OA (osteoarthritis) of shoulder    LEFT SHOULDER AND AC JOINT   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   23 mm Edwards Inspiris Resilia stented bioprosthetic tissue valve   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   LIMA to LAD, Sequential SVG to OM1-OM2, SVG to PDA, EVH via right thigh   Type 2 diabetes mellitus (HCC)     Patient has past surgical history positive for: Past Surgical History:  Procedure Laterality Date   AORTIC VALVE REPLACEMENT N/A 10/03/2017   Procedure: AORTIC VALVE REPLACEMENT (AVR);  Surgeon: Dusty Sudie DEL, MD;  Location[facility] OR;  Service: Open Heart Surgery;  Laterality: N/A;   APPENDECTOMY     BACK SURGERY  x3   CORONARY ARTERY BYPASS GRAFT N/A 10/03/2017   Procedure: CORONARY ARTERY BYPASS GRAFTING (CABG)x three, USING LEFT INTERNAL MAMMARY ARTERY AND RIGHT GREATER SAPHENOUS VEIN HARVESTED ENDOSCOPICALLY;  Surgeon: Dusty Sudie DEL, MD;  Location: [facility] OR;  Service: Open Heart Surgery;  Laterality: N/A;   CORONARY/GRAFT ACUTE MI REVASCULARIZATION N/A 10/02/2017   Procedure: Coronary/Graft Acute MI Revascularization;  Surgeon: Dann Candyce RAMAN, MD;  Location: [facility] INVASIVE CV LAB;  Service: Cardiovascular;  Laterality: N/A;   LEFT HEART CATH AND CORONARY ANGIOGRAPHY N/A 10/02/2017   Procedure: LEFT HEART CATH AND CORONARY ANGIOGRAPHY;  Surgeon: Dann Candyce RAMAN, MD;  Location: [facility] INVASIVE CV LAB;  Service: Cardiovascular;  Laterality: N/A;   LUMBAR LAMINECTOMY/DECOMPRESSION  MICRODISCECTOMY N/A 12/04/2021   Procedure: L2-3 DECOMPRESSION;  Surgeon: Burnetta Aures, MD;  Location: [facility] OR;  Service: Orthopedics;  Laterality: N/A;  2.5 hrs 3 C-Bed   RIGHT/LEFT HEART CATH AND CORONARY ANGIOGRAPHY N/A 09/12/2017   Procedure: RIGHT/LEFT HEART CATH AND CORONARY ANGIOGRAPHY;  Surgeon: Verlin Lonni BIRCH, MD;  Location: [facility] INVASIVE CV LAB;  Service: Cardiovascular;  Laterality: N/A;   TONSILLECTOMY      Provided general information including the following:  1.  Date of education: 08/13/2023 2.  Physician name: Dr. Valaria Kerns 3.  Diagnosis: Bladder cancer 4.  Stage: II 5.  Cure 6.  Chemotherapy plan including drugs and how often: Mitomycin/5-FU on weeks 1 and 5 of radiation 7.  Start date: 08/19/2023 8.  Other referrals: Nutrition services 9.  The patient is to call our office with any questions or concerns.  Our office number [phone], if after hours or on the weekend, call the same number and wait for the answering service.  There is always an oncologist on call. 10.  Medications prescribed: ondansetron , prochlorperazine  11.  The patient has verbalized understanding of the treatment plan and has no barriers to adherence or understanding.   Obtained signed consent from patient.   Discussed symptoms including:  1.  Low blood counts including white blood cells red blood cells, and platelets.  If experience increased fatigue or abnormal bruising or bleeding, call our office. 2.  Infection including to avoid large crowds, wash hands  frequently, and stay away from people who were sick.  If fever develops of 100.4 or higher, call our office. 3.  Mucositis:  Instructions on mouth rinse given (baking soda and salt mixture).  Keep mouth clean.  Use soft bristle toothbrush.  Avoid alcohol containing mouthwash.  If mouth sores develop, call our office. 4.  Nausea/vomiting:  Prescriptions given: ondansetron  8 mg every 8 hours as needed for nausea or vomiting and  prochlorperazine  10 mg every 6 hours as needed for nausea or vomiting, Korb alternate these medications and take around the clock if persistent.  If nausea and vomiting is not controlled, call our office 5.  Diarrhea: Use over-the-counter Imodium.  Call our office if diarrhea is not controlled. 6.  Constipation: Use senna-S, 1 to 2 tablets twice a day.  Call our office if no BM in 2 to 3 days. 7.  Loss of appetite:  Try to eat small meals every 2-3 hours.  Call our office if not able to eat or drink. 8.  Taste changes:  Try zinc 50 mg daily.  If becomes severe call clinic. 9.  Drink 2 to 3 quarts of water per day. Call our office if not able to drink enough for urine to be pale yellow. 10. Avoid alcoholic beverages. 11. Peripheral neuropathy: Call office if numbness or tingling in hands or feet worsens or is suddenly severe. 12.  Ringing in the ears or hearing loss.  Call our office if this develops.    The patient was given written information printed from [employer] patient education on individual chemotherapy agents which includes: Name of medications Approved uses Dose and schedule Storage and handling Handling body fluids and waste Drug and food interactions Possible side effects and management Pregnancy, sexual activity, and contraception Obtaining medication   Gave information on the supportive care team and how to contact them regarding services.  Discussed advanced directives.  The patient does not have their advanced directives.   We discussed that social determinants of health [place] have significant impacts on health and outcomes for cancer patients.  Today we discussed specific social determinants of performance status, alcohol use, depression, financial needs, food insecurity, housing, interpersonal violence, social connections, stress, tobacco use, and transportation.    After lengthy discussion the following were identified as areas of need:   Outpatient services: We discussed  options including home based and outpatient services, DME, nutrition counseling, and supportive care program. We discussed that patients who participate in regular physical activity report fewer negative impacts of cancer and treatments and report less fatigue.   Financial Concerns: We discussed that living with cancer can create tremendous financial burden.  We discussed options for assistance. I asked that if assistance is needed in affording medications or paying bills to please let us  know so that we can provide assistance. We discussed options for food including social services.  Referral to Social work:  Introduced services available, such as support with utility bill, cell phone and gas vouchers.  Introduced [place], [place] who can provide individual counseling.    Support groups: We discussed options for support groups at [facility]. We discussed options for managing stress including healthy eating and exercise, as well as participating in no charge counseling services at the cancer center and support groups.  If these are of interest, patient can notify either myself or primary nursing team.We discussed options for management including medications.  Transportation: We discussed options for transportation.  The patient will contact our office if he  requires assistance with transportation.  Palliative care services: We have palliative care services available in the cancer center to discuss goals of care and advanced care planning.  Please let us  know if you have any questions or would like to speak to our palliative care practitioner.  Symptom Management Clinic: We discussed our symptom management clinic which is available for acute concerns while receiving treatment such as nausea, vomiting or diarrhea.  We can be reached via telephone at [phone].  We are available for virtual or in person visits on the same day from 9 to 4 PM Monday through Friday.   He denies needing specific  assistance at this time.  I will refer him to our dietitian for support during therapy.  He will be followed by Dr. Trudi clinical team.   Disposition: RTC on 09/09/2023  Visit Diagnosis 1. Malignant neoplasm of lateral wall of urinary bladder (HCC)     I discussed the assessment and treatment plan with the patient.  The patient was provided an opportunity to ask questions and all were answered. The patient expressed understanding and was in agreement with this plan. He also understands that he can call clinic at any time with any questions, concerns, or complaints.   I provided 30 minutes of face-to-face time during this encounter, all of which was spent counseling as documented under my assessment & plan.   Ibrohim Simmers A. [place], PA-C [facility] [phone]

## 2023-08-14 ENCOUNTER — Encounter: Payer: Self-pay | Admitting: Hematology and Oncology

## 2023-08-14 ENCOUNTER — Encounter: Payer: Self-pay | Admitting: Oncology

## 2023-08-14 ENCOUNTER — Ambulatory Visit
Admission: RE | Admit: 2023-08-14 | Discharge: 2023-08-14 | Disposition: A | Discharge status: Home / Self Care | Source: Ambulatory Visit | Attending: Radiation Oncology | Admitting: Radiation Oncology

## 2023-08-14 ENCOUNTER — Telehealth: Payer: Self-pay

## 2023-08-14 DIAGNOSIS — C672 Malignant neoplasm of lateral wall of bladder: Secondary | ICD-10-CM | POA: Insufficient documentation

## 2023-08-14 DIAGNOSIS — Z5111 Encounter for antineoplastic chemotherapy: Secondary | ICD-10-CM | POA: Diagnosis not present

## 2023-08-14 DIAGNOSIS — D472 Monoclonal gammopathy: Secondary | ICD-10-CM | POA: Diagnosis not present

## 2023-08-14 DIAGNOSIS — N1330 Unspecified hydronephrosis: Secondary | ICD-10-CM | POA: Diagnosis not present

## 2023-08-14 DIAGNOSIS — Z510 Encounter for antineoplastic radiation therapy: Secondary | ICD-10-CM | POA: Diagnosis not present

## 2023-08-14 DIAGNOSIS — Z87891 Personal history of nicotine dependence: Secondary | ICD-10-CM | POA: Diagnosis not present

## 2023-08-14 DIAGNOSIS — C679 Malignant neoplasm of bladder, unspecified: Secondary | ICD-10-CM | POA: Diagnosis not present

## 2023-08-14 NOTE — Progress Notes (Signed)
 Patient scheduled to for urostomy placement on Tuesday 08/20/2023.  Per Dr. Cornelius it would be best to wait until after urostomy placed to start chemo.  Patient will start both radiation and chemotherapy on 08/26/2023.  Patient and spouse aware Dr. Jomarie spoke to them.

## 2023-08-14 NOTE — Telephone Encounter (Signed)
 Patients wife called and voiced she was not able to pick up the EMLA  cream from the pharmacy. [facility] contacted and pharmacist will reach out to [facility] and contact us  with any further concerns . Patient wife aware.

## 2023-08-15 DIAGNOSIS — C672 Malignant neoplasm of lateral wall of bladder: Secondary | ICD-10-CM | POA: Diagnosis not present

## 2023-08-15 DIAGNOSIS — C679 Malignant neoplasm of bladder, unspecified: Secondary | ICD-10-CM | POA: Diagnosis not present

## 2023-08-15 DIAGNOSIS — I2510 Atherosclerotic heart disease of native coronary artery without angina pectoris: Secondary | ICD-10-CM | POA: Diagnosis not present

## 2023-08-15 DIAGNOSIS — F5104 Psychophysiologic insomnia: Secondary | ICD-10-CM | POA: Diagnosis not present

## 2023-08-15 DIAGNOSIS — K219 Gastro-esophageal reflux disease without esophagitis: Secondary | ICD-10-CM | POA: Diagnosis not present

## 2023-08-15 DIAGNOSIS — Z9889 Other specified postprocedural states: Secondary | ICD-10-CM | POA: Diagnosis not present

## 2023-08-15 DIAGNOSIS — E1122 Type 2 diabetes mellitus with diabetic chronic kidney disease: Secondary | ICD-10-CM | POA: Diagnosis not present

## 2023-08-15 DIAGNOSIS — N184 Chronic kidney disease, stage 4 (severe): Secondary | ICD-10-CM | POA: Diagnosis not present

## 2023-08-15 DIAGNOSIS — E7800 Pure hypercholesterolemia, unspecified: Secondary | ICD-10-CM | POA: Diagnosis not present

## 2023-08-15 DIAGNOSIS — I129 Hypertensive chronic kidney disease with stage 1 through stage 4 chronic kidney disease, or unspecified chronic kidney disease: Secondary | ICD-10-CM | POA: Diagnosis not present

## 2023-08-15 DIAGNOSIS — N401 Enlarged prostate with lower urinary tract symptoms: Secondary | ICD-10-CM | POA: Diagnosis not present

## 2023-08-15 NOTE — Progress Notes (Signed)
 Patient has an upcoming appointment with Dr. Marda on 09/04/23 @ 8:45 am and patient is aware. Awaiting on records. Labs faxed to PCP and  mailed to patient

## 2023-08-16 NOTE — Progress Notes (Signed)
 Patient will pick up Ciprofloxacin 250 mg one a day for 5 days from pharmacy.

## 2023-08-16 NOTE — Progress Notes (Signed)
 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 male seen at the request of Dr. Marda.  Earlier this year, he was bothered by pain involving his right costovertebral angle; this was initially felt to represent urinary tract infection, but antibiotics ultimately did not improve his pain.  He was subsequently sent to see Dr. Marda, and underwent ultrasound, revealing right sided hydronephrosis.  Also noted was abnormality involving the right urinary bladder wall.  CT imaging of the abdomen and pelvis was performed on 07/01/2023 revealing mild bilateral renal atrophy, as well as right hydroureteronephrosis, and bladder thickening involving the right ureterovesicular junction.  Dr. Meredeth performed cystoscopy with biopsy and transurethral resection on 07/18/2023; pathology revealed infiltrating high-grade urothelial carcinoma, with involvement of the detrusor muscle.  Given the patient's age and comorbidities, he is not felt to be a candidate for cystectomy.  Of note, he has been seen in consultation by Dr. Ezzard, and systemic treatment options were discussed.  Consultation is requested regarding the potential role of radiation in his care.    PREVIOUS RADIATION THERAPY: No   PAST MEDICAL HISTORY:  Past Medical History:  Diagnosis Date   Aortic valve stenosis, severe    CAD S/P percutaneous coronary angioplasty    12-2006  STENT TO LAD;  07-2007 DEStenting TO RCA    CKD (chronic kidney disease)    GERD (gastroesophageal reflux disease)    Hypertension    Incidental pulmonary nodule, less than or equal to 3mm 10/02/2017   Ground glass  opacities RUL seen on CTA   Iron deficiency anemia    MGUS (monoclonal gammopathy of unknown significance)    Mixed hyperlipidemia    Myocardial infarction (HCC) 10/02/2017   OA (osteoarthritis) of shoulder    LEFT SHOULDER AND AC JOINT   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   23 mm Edwards Inspiris Resilia stented bioprosthetic tissue valve   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   LIMA to LAD, Sequential SVG to OM1-OM2, SVG to PDA, EVH via right thigh   Type 2 diabetes mellitus (HCC)        PAST SURGICAL HISTORY: Past Surgical History:  Procedure Laterality Date   AORTIC VALVE REPLACEMENT N/A 10/03/2017   Procedure: AORTIC VALVE REPLACEMENT (AVR);  Surgeon: Dusty Sudie DEL, MD;  Location: [facility] OR;  Service: Open Heart Surgery;  Laterality: N/A;   APPENDECTOMY     BACK SURGERY  x3   CORONARY ARTERY BYPASS GRAFT N/A 10/03/2017   Procedure: CORONARY ARTERY BYPASS GRAFTING (CABG)x three, USING LEFT INTERNAL MAMMARY ARTERY AND RIGHT GREATER SAPHENOUS VEIN HARVESTED ENDOSCOPICALLY;  Surgeon: Dusty Sudie DEL, MD;  Location: [facility] OR;  Service: Open Heart Surgery;  Laterality: N/A;   CORONARY/GRAFT ACUTE MI REVASCULARIZATION N/A 10/02/2017   Procedure: Coronary/Graft Acute MI Revascularization;  Surgeon: Dann Candyce RAMAN, MD;  Location: [facility] INVASIVE CV LAB;  Service: Cardiovascular;  Laterality: N/A;   LEFT HEART CATH AND CORONARY ANGIOGRAPHY N/A 10/02/2017   Procedure: LEFT HEART CATH AND CORONARY ANGIOGRAPHY;  Surgeon: Dann Candyce RAMAN, MD;  Location: [facility] INVASIVE CV LAB;  Service: Cardiovascular;  Laterality: N/A;  LUMBAR LAMINECTOMY/DECOMPRESSION MICRODISCECTOMY N/A 12/04/2021   Procedure: L2-3 DECOMPRESSION;  Surgeon: Burnetta Aures, MD;  Location: [facility] OR;  Service: Orthopedics;  Laterality: N/A;  2.5 hrs 3 C-Bed   RIGHT/LEFT HEART CATH AND CORONARY ANGIOGRAPHY N/A 09/12/2017   Procedure: RIGHT/LEFT HEART CATH AND CORONARY ANGIOGRAPHY;  Surgeon: Verlin Lonni BIRCH, MD;  Location: [facility] INVASIVE CV LAB;  Service: Cardiovascular;  Laterality: N/A;   TONSILLECTOMY       FAMILY HISTORY:  Family History  Problem Relation Age of Onset   CVA Father    Heart attack Sister    Heart attack Brother        3 brothers with CAD     SOCIAL HISTORY:  reports that he quit smoking about 23 years ago. His smoking use included cigarettes. He started smoking about 63 years ago. He has never used smokeless tobacco. He reports that he does not drink alcohol and does not use drugs.   ALLERGIES: Iodinated contrast media, Other, Sucralfate, and Penicillins   MEDICATIONS:  Current Outpatient Medications  Medication Sig Dispense Refill   amLODipine  (NORVASC ) 10 MG tablet TAKE 1 TABLET(10 MG) BY MOUTH DAILY 90 tablet 0   aspirin  EC 81 MG tablet Take 81 mg by mouth daily.     atorvastatin  (LIPITOR) 40 MG tablet Take 40 mg by mouth at bedtime.     Cyanocobalamin (B-12) 1000 MCG TABS Take 1,000 mcg by mouth daily.     diphenhydrAMINE  (BENADRYL ) 50 MG tablet Take 50 mg by mouth every 8 (eight) hours as needed.     Ferrous Sulfate  (IRON) 325 (65 Fe) MG TABS Take by mouth. 1 tablet 3 times a week (Patient not taking: Reported on 08/13/2023)     gabapentin  (NEURONTIN ) 300 MG capsule Take 300 mg by mouth 2 (two) times daily. (Patient taking differently: Take 300 mg by mouth 2 (two) times daily. Taking as needed)     glimepiride (AMARYL) 2 MG tablet Take 2 mg by mouth daily with breakfast.     glucose blood (ONETOUCH ULTRA) test strip USE TO CHECK BLOOD SUGAR ONCE A DAY E11.9     HYDROcodone -acetaminophen  (NORCO) 10-325 MG tablet Take 1 tablet by mouth every 6 (six) hours as needed.     isosorbide  mononitrate (IMDUR ) 30 MG 24 hr tablet TAKE 1 TABLET(30 MG) BY MOUTH DAILY 90 tablet 3   lidocaine -prilocaine  (EMLA ) cream Apply to affected area once 30 g 3   methocarbamol  (ROBAXIN ) 500 MG tablet Take 500 mg by mouth every 8 (eight) hours as needed.     nitroGLYCERIN  (NITROSTAT ) 0.4  MG SL tablet Place 1 tablet (0.4 mg total) under the tongue every 5 (five) minutes as needed for chest pain. 25 tablet 3   nystatin cream (MYCOSTATIN) Apply 1 Application topically as needed.     ondansetron  (ZOFRAN ) 8 MG tablet Take 1 tablet (8 mg total) by mouth every 8 (eight) hours as needed for nausea or vomiting. 30 tablet 1   OPTIFIBER LEAN PO Take by mouth.     Oxycodone  HCl 10 MG TABS One tab po q4-6hr prn pain 60 tablet 0   pantoprazole  (PROTONIX ) 40 MG tablet Take 40 mg by mouth daily.     polyethylene glycol powder (GLYCOLAX /MIRALAX ) 17 GM/SCOOP powder Take by mouth once.     prochlorperazine  (COMPAZINE ) 10 MG tablet Take 1 tablet (10 mg total) by mouth every 6 (six) hours as needed for nausea or vomiting. 30 tablet 1   tamsulosin (FLOMAX) 0.4 MG CAPS capsule Take  0.4 mg by mouth daily.     URIBEL 81.6 MG TABS Take 1 tablet by mouth every 8 (eight) hours as needed.     zolpidem  (AMBIEN ) 10 MG tablet Take 10 mg by mouth at bedtime as needed.     No current facility-administered medications for this encounter.   Facility-Administered Medications Ordered in Other Encounters  Medication Dose Route Frequency Provider Last Rate Last Admin   regadenoson  (LEXISCAN ) injection SOLN 0.4 mg  0.4 mg Intravenous Once Nishan, Demarious C, MD       technetium tetrofosmin  (TC-MYOVIEW ) injection 31 millicurie  31 millicurie Intravenous Once PRN Nishan, Zai C, MD         REVIEW OF SYSTEMS: On review of systems, the patient reports that he is doing well overall.  He denies any chest pain, shortness of breath, cough, fevers, chills, night sweats, unintended weight changes.  He denies any bowel or bladder disturbances, and denies abdominal pain, nausea or vomiting.  He denies any new musculoskeletal or joint aches or pains. A complete review of systems is obtained and is otherwise negative.     PHYSICAL EXAM:  Wt Readings from Last 3 Encounters:  08/13/23 178 lb (80.7 kg)  08/13/23 176 lb 2.2 oz  (79.9 kg)  08/07/23 176 lb 14.4 oz (80.2 kg)   Temp Readings from Last 3 Encounters:  08/13/23 97.7 F (36.5 C) (Oral)  08/13/23 97.7 F (36.5 C) (Oral)  08/07/23 98.3 F (36.8 C) (Oral)   BP Readings from Last 3 Encounters:  08/13/23 135/71  08/13/23 135/71  08/07/23 (!) 142/66   Pulse Readings from Last 3 Encounters:  08/13/23 65  08/13/23 65  08/07/23 (!) 56   Pain Assessment Pain Score: 5  Pain Loc: Back/10  In general this is a well appearing male in no acute distress.  He's alert and oriented x4 and appropriate throughout the examination. Cardiopulmonary assessment is negative for acute distress and he exhibits normal effort.  His abdomen is nontender and nondistended.  Extremities are without edema.0    ECOG = 0  0 - Asymptomatic (Fully active, able to carry on all predisease activities without restriction)  1 - Symptomatic but completely ambulatory (Restricted in physically strenuous activity but ambulatory and able to carry out work of a light or sedentary nature. For example, light housework, office work)  2 - Symptomatic, <50% in bed during the day (Ambulatory and capable of all self care but unable to carry out any work activities. Up and about more than 50% of waking hours)  3 - Symptomatic, >50% in bed, but not bedbound (Capable of only limited self-care, confined to bed or chair 50% or more of waking hours)  4 - Bedbound (Completely disabled. Cannot carry on any self-care. Totally confined to bed or chair)  5 - Death   Raylene MM, Creech RH, Tormey DC, et al. [phone]). Toxicity and response criteria of the [facility]. Am. DOROTHA Bridges. Oncol. 5 (6): 649-55    LABORATORY DATA:  Lab Results  Component Value Date   WBC 9.7 08/07/2023   HGB 13.0 08/07/2023   HCT 40.2 08/07/2023   MCV 90.7 08/07/2023   PLT 173 08/07/2023   Lab Results  Component Value Date   NA 140 08/07/2023   K 4.2 08/07/2023   CL 104 08/07/2023   CO2 23  08/07/2023   Lab Results  Component Value Date   ALT 8 08/07/2023   AST 16 08/07/2023   ALKPHOS 124 08/07/2023  BILITOT 0.4 08/07/2023         IMPRESSION/PLAN: 1.  The patient is an 83 year old male with infiltrating high-grade urothelial carcinoma of the bladder, with an patient of muscularis propria.  He is not interested in pursuing cystectomy, nor would he be an ideal candidate.  I have therefore recommended to him that we proceed with external beam radiation to the pelvis/bladder, to be delivered in conjunction with concurrent systemic chemotherapy.  I explained the rationale behind this recommendation, as well as the potential side effects associated with external beam radiation in his clinical scenario.  I explained that these Gervin include, but are not limited to, fatigue, bladder irritation/increased urinary frequency, and loose bowel movements/diarrhea.  I also discussed potential long-term side effects of radiation, including the risk of injury to small bowel, bladder, and other normal tissues.  I reviewed the logistics of external beam radiation as well.  Erik Keith expressed understanding, and is agreeable to proceed.  Arrangements will be made for him to undergo simulation, and treatment planning.   In a visit lasting 60 minutes, greater than 50% of the time was spent face to face discussing the patient's condition, in preparation for the discussion, and coordinating the patient's care.    Jemiah Cuadra A. Jomarie, MD   **Disclaimer: This note was dictated with voice recognition software. Similar sounding words can inadvertently be transcribed and this note Grounds contain transcription errors which [place] not have been corrected upon publication of note.**

## 2023-08-17 NOTE — H&P (Signed)
 Chief Complaint: Right sided ureteral obstruction with hydronephrosis. Request is for right sided nephrostomy tube placement.   Referring Physician(s): Chao,Roberto  Supervising Physician: {Supervising Physician:[id number]}  Patient Status: [facility] - Out-pt  History of Present Illness: Feras Gardella Boulier is a 83 y.o. male  male outpatient. History of HTN, HLD, DM, CKD, CAD, MI s/p CABG, MGUS. Bladder cancer with right sided obstruction and right sided hydronephrosis. Request is for right sided nephrostomy tube placement. Images from on on PACS. Request approved by IR Attending Dr. Wilkie Lent.  Currently without any significant complaints. Patient alert and laying in bed,calm. Denies any fevers, headache, chest pain, SOB, cough, abdominal pain, nausea, vomiting or bleeding.   *** All  labs are within acceptable parameters. Patient is on 81 mg of ASA. Last dose ***. Allergies include contrast dye reaction hives, PCN. Patient has been NPO since midnight.  Return precautions and treatment recommendations and follow-up discussed with the patient *** who is agreeable with the plan.     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   23 mm Edwards Inspiris Resilia stented bioprosthetic tissue valve   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   LIMA to LAD, Sequential SVG to OM1-OM2, SVG to PDA, EVH via right  thigh   Type 2 diabetes mellitus (HCC)     Past Surgical History:  Procedure Laterality Date   AORTIC VALVE REPLACEMENT N/A 10/03/2017   Procedure: AORTIC VALVE REPLACEMENT (AVR);  Surgeon: Dusty Sudie DEL, MD;  Location: [facility] OR;  Service: Open Heart Surgery;  Laterality: N/A;   APPENDECTOMY     BACK SURGERY  x3   CORONARY ARTERY BYPASS GRAFT N/A 10/03/2017   Procedure: CORONARY ARTERY BYPASS GRAFTING (CABG)x three, USING LEFT INTERNAL MAMMARY ARTERY AND RIGHT GREATER SAPHENOUS VEIN HARVESTED ENDOSCOPICALLY;  Surgeon: Dusty Sudie DEL, MD;  Location: [facility] OR;  Service: Open Heart Surgery;  Laterality: N/A;   CORONARY/GRAFT ACUTE MI REVASCULARIZATION N/A 10/02/2017   Procedure: Coronary/Graft Acute MI Revascularization;  Surgeon: Dann Candyce RAMAN, MD;  Location: [facility] INVASIVE CV LAB;  Service: Cardiovascular;  Laterality: N/A;   LEFT HEART CATH AND CORONARY ANGIOGRAPHY N/A 10/02/2017   Procedure: LEFT HEART CATH AND CORONARY ANGIOGRAPHY;  Surgeon: Dann Candyce RAMAN, MD;  Location: [facility] INVASIVE CV LAB;  Service: Cardiovascular;  Laterality: N/A;   LUMBAR LAMINECTOMY/DECOMPRESSION MICRODISCECTOMY N/A 12/04/2021   Procedure: L2-3 DECOMPRESSION;  Surgeon: Burnetta Aures, MD;  Location: [facility] OR;  Service: Orthopedics;  Laterality: N/A;  2.5 hrs 3 C-Bed   RIGHT/LEFT HEART CATH AND CORONARY ANGIOGRAPHY N/A 09/12/2017   Procedure: RIGHT/LEFT HEART CATH AND CORONARY ANGIOGRAPHY;  Surgeon: Verlin Lonni BIRCH, MD;  Location: [facility] INVASIVE CV LAB;  Service: Cardiovascular;  Laterality: N/A;   TONSILLECTOMY      Allergies: Iodinated contrast media, Other, Sucralfate, and Penicillins  Medications: Prior to Admission medications  Medication Sig Start Date End Date Taking? Authorizing Provider  amLODipine  (NORVASC ) 10 MG tablet TAKE 1 TABLET(10 MG) BY MOUTH DAILY 04/12/23   Wyn Jackee VEAR Mickey., NP  aspirin  EC 81 MG tablet Take 81 mg by mouth daily. 09/14/19   [provider]  atorvastatin   (LIPITOR) 40 MG tablet Take 40 mg by mouth at bedtime. 04/15/21   [provider]  Cyanocobalamin (B-12) 1000 MCG TABS Take 1,000 mcg by mouth daily.    [provider]  diphenhydrAMINE  (BENADRYL ) 50 MG tablet Take 50 mg by mouth every 8 (eight) hours as needed. 07/31/23   [provider]  Ferrous Sulfate  (IRON) 325 (65 Fe) MG TABS Take by mouth. 1 tablet 3 times a week Patient not taking: Reported on 08/13/2023    [provider]  gabapentin  (NEURONTIN ) 300 MG capsule Take 300 mg by mouth 2 (two) times daily. Patient taking differently: Take 300 mg by mouth 2 (two) times daily. Taking as needed 12/07/21   [provider]  glimepiride (AMARYL) 2 MG tablet Take 2 mg by mouth daily with breakfast.    [provider]  glucose blood (ONETOUCH ULTRA) test strip USE TO CHECK BLOOD SUGAR ONCE A DAY E11.9 02/23/19   [provider]  HYDROcodone -acetaminophen  (NORCO) 10-325 MG tablet Take 1 tablet by mouth every 6 (six) hours as needed.    [provider]  isosorbide  mononitrate (IMDUR ) 30 MG 24 hr tablet TAKE 1 TABLET(30 MG) BY MOUTH DAILY 06/19/23   Verlin Lonni BIRCH, MD  lidocaine -prilocaine  (EMLA ) cream Apply to affected area once 08/13/23   Lewis, Dequincy A, MD  methocarbamol  (ROBAXIN ) 500 MG tablet Take 500 mg by mouth every 8 (eight) hours as needed. 01/09/22   [provider]  nitroGLYCERIN  (NITROSTAT ) 0.4 MG SL tablet Place 1 tablet (0.4 mg total) under the tongue every 5 (five) minutes as needed for chest pain. 10/22/17   Parthenia Olivia HERO, PA-C  nystatin cream (MYCOSTATIN) Apply 1 Application topically as needed. 06/27/23   [provider]  ondansetron  (ZOFRAN ) 8 MG tablet Take 1 tablet (8 mg total) by mouth every 8 (eight) hours as needed for nausea or vomiting. 08/13/23   Ezzard Valaria LABOR, MD  OPTIFIBER LEAN PO Take by mouth.    [provider]  Oxycodone  HCl 10 MG TABS One tab po q4-6hr prn pain  08/07/23   Lewis, Dequincy A, MD  pantoprazole  (PROTONIX ) 40 MG tablet Take 40 mg by mouth daily. 12/09/19   [provider]  polyethylene glycol powder (GLYCOLAX /MIRALAX ) 17 GM/SCOOP powder Take by mouth once.    [provider]  prochlorperazine  (COMPAZINE ) 10 MG tablet Take 1 tablet (10 mg total) by mouth every 6 (six) hours as needed for nausea or vomiting. 08/13/23   Ezzard, Dequincy A, MD  tamsulosin (FLOMAX) 0.4 MG CAPS capsule Take 0.4 mg by mouth daily. 06/25/23   [provider]  URIBEL 81.6 MG TABS Take 1 tablet by mouth every 8 (eight) hours as needed. 08/12/23   [provider]  zolpidem  (AMBIEN ) 10 MG tablet Take 10 mg by mouth at bedtime as needed. 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  Tobacco Use   Smoking status: Former    Current packs/day: 0.00    Types: Cigarettes    Start date: 1962    Quit date: 2002    Years since quitting: 23.6   Smokeless tobacco: Never  Vaping Use   Vaping status: Never Used  Substance and Sexual Activity   Alcohol use: Never   Drug use: Never   Sexual activity: Not Currently  Other Topics Concern   Not on file  Social History Narrative   Not on file   Social Drivers of Health   Financial Resource Strain: Low Risk  (05/31/2023)   Received from [facility]   Overall Financial Resource Strain (CARDIA)    Difficulty of Paying Living Expenses: Not hard at all  Food Insecurity: Low Risk  (07/26/2023)   Received from [facility]   Hunger Vital Sign    Within the past 12 months, you worried that your food would  run out before you got money to buy more: Never true    Within the past 12 months, the food you bought just didn't last and you didn't have money to get more. : Never true  Transportation Needs: No Transportation Needs (08/13/2023)   PRAPARE - Administrator, Civil Service (Medical): No    Lack of Transportation (Non-Medical): No  Recent Concern: Transportation Needs - Unmet Transportation Needs (07/26/2023)   Received from [facility]   Transportation    In the past 12 months, has lack of reliable transportation kept you from medical appointments, meetings, work or from getting things needed for daily living? : Yes  Physical Activity: Not on file  Stress: Not on file  Social Connections: Not on file    ECOG Status: {CHL ONC ECOG ED:[id number]}  Review of Systems: A 12 point ROS discussed and pertinent positives are indicated in the HPI above.  All other systems are negative.  Review of Systems  Vital Signs: There were no vitals taken for this visit.  Advance Care Plan: {Advance Care Eojw:73180}    Physical Exam  Imaging: No results found.  Labs:  CBC: Recent Labs    02/26/23 1143 08/07/23 1033  WBC 9.2 9.7  HGB 13.3 13.0  HCT 40.4 40.2  PLT 144* 173    COAGS: No results for input(s): INR, APTT in the last 8760 hours.  BMP: Recent Labs    02/26/23 1143 08/07/23 1033  NA 140 140  K 4.8 4.2  CL 106 104  CO2 21* 23  GLUCOSE 150* 200*  BUN 50* 27*  CALCIUM  10.0 9.7  CREATININE 3.44* 2.63*  GFRNONAA 17* 23*    LIVER FUNCTION TESTS: Recent Labs    02/26/23 1143 08/07/23 1033  BILITOT 0.3 0.4  AST 14* 16  ALT 7 8  ALKPHOS 110 124  PROT 7.7 7.7  ALBUMIN  4.1 4.2    TUMOR MARKERS: No results for input(s): AFPTM, CEA, CA199, CHROMGRNA in the last 8760 hours.  Assessment and Plan:  83 y.o. male outpatient. History of HTN, HLD, DM, CKD, CAD, MI s/p CABG, MGUS. Bladder cancer with right sided obstruction and right sided  hydronephrosis. Request is for right sided nephrostomy tube placement. Images from [facility] on PACS. Request approved by IR Attending Dr. Wilkie Lent.  PLAN: IR Image Guided Nephrostomy  Tube Placement.     Thank you for this interesting consult.  I greatly enjoyed meeting Rhonin Trott Vroom and look forward to participating in their care.  A copy of this report was sent to the requesting provider on this date.  Electronically Signed: Delon JAYSON Beagle, NP 08/17/2023, 3:52 PM   I spent a total of {New PWEU:[id number]} {New Out-Pt:[id number]}  {Established Out-Pt:[id number]} in face to face in clinical consultation, greater than 50% of which was counseling/coordinating care for ***

## 2023-08-19 ENCOUNTER — Inpatient Hospital Stay

## 2023-08-19 ENCOUNTER — Ambulatory Visit: Admitting: Radiation Oncology

## 2023-08-19 ENCOUNTER — Other Ambulatory Visit: Payer: Self-pay

## 2023-08-19 ENCOUNTER — Telehealth: Payer: Self-pay

## 2023-08-19 DIAGNOSIS — Z01818 Encounter for other preprocedural examination: Secondary | ICD-10-CM

## 2023-08-19 DIAGNOSIS — Z510 Encounter for antineoplastic radiation therapy: Secondary | ICD-10-CM | POA: Diagnosis not present

## 2023-08-19 DIAGNOSIS — D472 Monoclonal gammopathy: Secondary | ICD-10-CM | POA: Diagnosis not present

## 2023-08-19 DIAGNOSIS — Z5111 Encounter for antineoplastic chemotherapy: Secondary | ICD-10-CM | POA: Diagnosis not present

## 2023-08-19 DIAGNOSIS — Z87891 Personal history of nicotine dependence: Secondary | ICD-10-CM | POA: Diagnosis not present

## 2023-08-19 DIAGNOSIS — N1330 Unspecified hydronephrosis: Secondary | ICD-10-CM | POA: Diagnosis not present

## 2023-08-19 DIAGNOSIS — C679 Malignant neoplasm of bladder, unspecified: Secondary | ICD-10-CM | POA: Diagnosis not present

## 2023-08-19 DIAGNOSIS — C672 Malignant neoplasm of lateral wall of bladder: Secondary | ICD-10-CM | POA: Diagnosis not present

## 2023-08-19 MED ORDER — PREDNISONE 1 MG PO TABS: 50.0000 mg | ORAL_TABLET | Freq: Every day | ORAL | Status: DC

## 2023-08-19 MED ORDER — PREDNISONE 50 MG PO TABS
ORAL_TABLET | ORAL | 0 refills | Status: DC
Start: 2023-08-19 — End: 2023-09-27

## 2023-08-19 MED ORDER — DIPHENHYDRAMINE HCL 25 MG PO CAPS: 50.0000 mg | ORAL_CAPSULE | Freq: Once | ORAL | Status: DC

## 2023-08-19 MED ORDER — LEVOFLOXACIN IN D5W 750 MG/150ML IV SOLN: 500.0000 mg | INTRAVENOUS | Status: DC

## 2023-08-19 NOTE — Telephone Encounter (Signed)
 Patient called, aware of need to pick up prednisone  for 13 hr prep. Understands he will take a dose at 1am and 7am. RN will provide 1hr prior dose of prednisone  and benadryl .  Confirmed pharmacy. Patient states he will pick up shortly.

## 2023-08-20 ENCOUNTER — Other Ambulatory Visit: Payer: Self-pay

## 2023-08-20 ENCOUNTER — Other Ambulatory Visit (HOSPITAL_COMMUNITY): Payer: Self-pay | Admitting: Interventional Radiology

## 2023-08-20 ENCOUNTER — Ambulatory Visit (HOSPITAL_COMMUNITY)
Admission: RE | Admit: 2023-08-20 | Discharge: 2023-08-20 | Disposition: A | Discharge status: Home / Self Care | Source: Ambulatory Visit | Attending: Urology | Admitting: Urology

## 2023-08-20 ENCOUNTER — Ambulatory Visit

## 2023-08-20 DIAGNOSIS — N131 Hydronephrosis with ureteral stricture, not elsewhere classified: Secondary | ICD-10-CM | POA: Insufficient documentation

## 2023-08-20 DIAGNOSIS — Z7982 Long term (current) use of aspirin: Secondary | ICD-10-CM | POA: Diagnosis not present

## 2023-08-20 DIAGNOSIS — Z951 Presence of aortocoronary bypass graft: Secondary | ICD-10-CM | POA: Diagnosis not present

## 2023-08-20 DIAGNOSIS — E1122 Type 2 diabetes mellitus with diabetic chronic kidney disease: Secondary | ICD-10-CM | POA: Insufficient documentation

## 2023-08-20 DIAGNOSIS — I252 Old myocardial infarction: Secondary | ICD-10-CM | POA: Diagnosis not present

## 2023-08-20 DIAGNOSIS — Z87891 Personal history of nicotine dependence: Secondary | ICD-10-CM | POA: Diagnosis not present

## 2023-08-20 DIAGNOSIS — Z01818 Encounter for other preprocedural examination: Secondary | ICD-10-CM

## 2023-08-20 DIAGNOSIS — N135 Crossing vessel and stricture of ureter without hydronephrosis: Secondary | ICD-10-CM

## 2023-08-20 DIAGNOSIS — Z7984 Long term (current) use of oral hypoglycemic drugs: Secondary | ICD-10-CM | POA: Insufficient documentation

## 2023-08-20 DIAGNOSIS — I2510 Atherosclerotic heart disease of native coronary artery without angina pectoris: Secondary | ICD-10-CM | POA: Diagnosis not present

## 2023-08-20 DIAGNOSIS — Z8249 Family history of ischemic heart disease and other diseases of the circulatory system: Secondary | ICD-10-CM | POA: Insufficient documentation

## 2023-08-20 DIAGNOSIS — Z79899 Other long term (current) drug therapy: Secondary | ICD-10-CM | POA: Diagnosis not present

## 2023-08-20 DIAGNOSIS — G8929 Other chronic pain: Secondary | ICD-10-CM | POA: Diagnosis not present

## 2023-08-20 DIAGNOSIS — C679 Malignant neoplasm of bladder, unspecified: Secondary | ICD-10-CM | POA: Insufficient documentation

## 2023-08-20 DIAGNOSIS — I129 Hypertensive chronic kidney disease with stage 1 through stage 4 chronic kidney disease, or unspecified chronic kidney disease: Secondary | ICD-10-CM | POA: Insufficient documentation

## 2023-08-20 DIAGNOSIS — M549 Dorsalgia, unspecified: Secondary | ICD-10-CM | POA: Insufficient documentation

## 2023-08-20 DIAGNOSIS — N189 Chronic kidney disease, unspecified: Secondary | ICD-10-CM | POA: Insufficient documentation

## 2023-08-20 LAB — BASIC METABOLIC PANEL WITH GFR
Anion gap: 12 (ref 5–15)
BUN: 29 mg/dL — ABNORMAL HIGH (ref 8–23)
CO2: 23 mmol/L (ref 22–32)
Calcium: 9.5 mg/dL (ref 8.9–10.3)
Chloride: 105 mmol/L (ref 98–111)
Creatinine, Ser: 2.57 mg/dL — ABNORMAL HIGH (ref 0.61–1.24)
GFR, Estimated: 24 mL/min — ABNORMAL LOW (ref >60)
Glucose, Bld: 193 mg/dL — ABNORMAL HIGH (ref 70–99)
Potassium: 4.4 mmol/L (ref 3.5–5.1)
Sodium: 140 mmol/L (ref 135–145)

## 2023-08-20 LAB — CBC
HCT: 37.6 % — ABNORMAL LOW (ref 39.0–52.0)
Hemoglobin: 12.2 g/dL — ABNORMAL LOW (ref 13.0–17.0)
MCH: 29.3 pg (ref 26.0–34.0)
MCHC: 32.4 g/dL (ref 30.0–36.0)
MCV: 90.4 fL (ref 80.0–100.0)
Platelets: 179 K/uL (ref 150–400)
RBC: 4.16 MIL/uL — ABNORMAL LOW (ref 4.22–5.81)
RDW: 14.4 % (ref 11.5–15.5)
WBC: 8.6 K/uL (ref 4.0–10.5)
nRBC: 0 % (ref 0.0–0.2)

## 2023-08-20 LAB — PROTIME-INR
INR: 1 (ref 0.8–1.2)
Prothrombin Time: 13.3 s (ref 11.4–15.2)

## 2023-08-20 LAB — GLUCOSE, CAPILLARY: Glucose-Capillary: 198 mg/dL — ABNORMAL HIGH (ref 70–99)

## 2023-08-20 MED ORDER — MIDAZOLAM HCL 2 MG/2ML IJ SOLN
INTRAMUSCULAR | Status: AC | PRN
Start: 2023-08-20 — End: 2023-08-20
  Administered 2023-08-20 (×2): 1 mg via INTRAVENOUS

## 2023-08-20 MED ORDER — PREDNISONE 20 MG PO TABS
ORAL_TABLET | ORAL | Status: AC
  Filled 2023-08-20: qty 3

## 2023-08-20 MED ORDER — LIDOCAINE-EPINEPHRINE 1 %-1:100000 IJ SOLN
20.0000 mL | Freq: Once | INTRAMUSCULAR | Status: AC
  Administered 2023-08-20: 10 mL via INTRADERMAL

## 2023-08-20 MED ORDER — LEVOFLOXACIN IN D5W 500 MG/100ML IV SOLN
500.0000 mg | Freq: Once | INTRAVENOUS | Status: AC
  Administered 2023-08-20: 500 mg via INTRAVENOUS
  Filled 2023-08-20 (×2): qty 100

## 2023-08-20 MED ORDER — PREDNISONE 20 MG PO TABS
50.0000 mg | ORAL_TABLET | Freq: Once | ORAL | Status: AC
  Administered 2023-08-20: 50 mg via ORAL

## 2023-08-20 MED ORDER — LIDOCAINE-EPINEPHRINE 1 %-1:100000 IJ SOLN
INTRAMUSCULAR | Status: AC
  Filled 2023-08-20: qty 1

## 2023-08-20 MED ORDER — DIPHENHYDRAMINE HCL 50 MG PO CAPS
50.0000 mg | ORAL_CAPSULE | Freq: Once | ORAL | Status: AC
  Administered 2023-08-20: 50 mg via ORAL
  Filled 2023-08-20 (×2): qty 1

## 2023-08-20 MED ORDER — MIDAZOLAM HCL 2 MG/2ML IJ SOLN
INTRAMUSCULAR | Status: AC
Start: 2023-08-20 — End: 2023-08-20
  Filled 2023-08-20: qty 2

## 2023-08-20 MED ORDER — IOHEXOL 300 MG/ML  SOLN
50.0000 mL | Freq: Once | INTRAMUSCULAR | Status: AC | PRN
  Administered 2023-08-20: 10 mL

## 2023-08-20 MED ORDER — FENTANYL CITRATE (PF) 100 MCG/2ML IJ SOLN
INTRAMUSCULAR | Status: AC | PRN
  Administered 2023-08-20 (×2): 50 ug via INTRAVENOUS

## 2023-08-20 MED ORDER — DIPHENHYDRAMINE HCL 25 MG PO CAPS
50.0000 mg | ORAL_CAPSULE | Freq: Once | ORAL | Status: AC
  Filled 2023-08-20: qty 2

## 2023-08-20 MED ORDER — FENTANYL CITRATE (PF) 100 MCG/2ML IJ SOLN
INTRAMUSCULAR | Status: AC
  Filled 2023-08-20: qty 2

## 2023-08-20 MED ORDER — SODIUM CHLORIDE 0.9 % IV SOLN: INTRAVENOUS | Status: DC

## 2023-08-20 NOTE — Progress Notes (Signed)
 Right flank nephrostomy tube draining pink tinged urine

## 2023-08-20 NOTE — Procedures (Signed)
 Interventional Radiology Procedure Note  Procedure: Placement of a 82F RIGHT PCN  Complications: None  Estimated Blood Loss: None  Recommendations: - Bedrest x 2 hrs - DC home - Return in 6 wks for tube check/change  Signed,  Wilkie LOIS Lent, MD

## 2023-08-21 ENCOUNTER — Ambulatory Visit

## 2023-08-21 ENCOUNTER — Ambulatory Visit: Admitting: Oncology

## 2023-08-21 ENCOUNTER — Other Ambulatory Visit

## 2023-08-22 ENCOUNTER — Ambulatory Visit

## 2023-08-23 ENCOUNTER — Inpatient Hospital Stay

## 2023-08-23 ENCOUNTER — Ambulatory Visit

## 2023-08-26 ENCOUNTER — Ambulatory Visit

## 2023-08-26 ENCOUNTER — Other Ambulatory Visit: Payer: Self-pay | Admitting: Hematology and Oncology

## 2023-08-26 ENCOUNTER — Other Ambulatory Visit: Payer: Self-pay

## 2023-08-26 ENCOUNTER — Ambulatory Visit
Admission: RE | Admit: 2023-08-26 | Discharge: 2023-08-26 | Disposition: A | Discharge status: Home / Self Care | Source: Ambulatory Visit | Attending: Radiation Oncology | Admitting: Radiation Oncology

## 2023-08-26 ENCOUNTER — Other Ambulatory Visit: Payer: Self-pay | Admitting: Pharmacist

## 2023-08-26 ENCOUNTER — Inpatient Hospital Stay

## 2023-08-26 ENCOUNTER — Encounter: Payer: Self-pay | Admitting: Oncology

## 2023-08-26 VITALS — BP 138/71 | HR 79 | Temp 98.2°F | Resp 18

## 2023-08-26 DIAGNOSIS — N1330 Unspecified hydronephrosis: Secondary | ICD-10-CM | POA: Diagnosis not present

## 2023-08-26 DIAGNOSIS — D472 Monoclonal gammopathy: Secondary | ICD-10-CM | POA: Diagnosis not present

## 2023-08-26 DIAGNOSIS — Z87891 Personal history of nicotine dependence: Secondary | ICD-10-CM | POA: Diagnosis not present

## 2023-08-26 DIAGNOSIS — C672 Malignant neoplasm of lateral wall of bladder: Secondary | ICD-10-CM | POA: Diagnosis not present

## 2023-08-26 DIAGNOSIS — C679 Malignant neoplasm of bladder, unspecified: Secondary | ICD-10-CM | POA: Diagnosis not present

## 2023-08-26 DIAGNOSIS — Z5111 Encounter for antineoplastic chemotherapy: Secondary | ICD-10-CM | POA: Diagnosis not present

## 2023-08-26 DIAGNOSIS — C676 Malignant neoplasm of ureteric orifice: Secondary | ICD-10-CM

## 2023-08-26 DIAGNOSIS — Z510 Encounter for antineoplastic radiation therapy: Secondary | ICD-10-CM | POA: Diagnosis not present

## 2023-08-26 LAB — CMP (CANCER CENTER ONLY)
ALT: 11 U/L (ref 0–44)
AST: 19 U/L (ref 15–41)
Albumin: 4.3 g/dL (ref 3.5–5.0)
Alkaline Phosphatase: 112 U/L (ref 38–126)
Anion gap: 14 (ref 5–15)
BUN: 33 mg/dL — ABNORMAL HIGH (ref 8–23)
CO2: 24 mmol/L (ref 22–32)
Calcium: 9.6 mg/dL (ref 8.9–10.3)
Chloride: 100 mmol/L (ref 98–111)
Creatinine: 2.63 mg/dL — ABNORMAL HIGH (ref 0.61–1.24)
GFR, Estimated: 23 mL/min — ABNORMAL LOW (ref >60)
Glucose, Bld: 155 mg/dL — ABNORMAL HIGH (ref 70–99)
Potassium: 4.1 mmol/L (ref 3.5–5.1)
Sodium: 138 mmol/L (ref 135–145)
Total Bilirubin: 0.5 mg/dL (ref 0.0–1.2)
Total Protein: 7.6 g/dL (ref 6.5–8.1)

## 2023-08-26 LAB — CBC WITH DIFFERENTIAL (CANCER CENTER ONLY)
Abs Immature Granulocytes: 0.04 K/uL (ref 0.00–0.07)
Basophils Absolute: 0.1 K/uL (ref 0.0–0.1)
Basophils Relative: 0 %
Eosinophils Absolute: 0.5 K/uL (ref 0.0–0.5)
Eosinophils Relative: 4 %
HCT: 38.7 % — ABNORMAL LOW (ref 39.0–52.0)
Hemoglobin: 12.6 g/dL — ABNORMAL LOW (ref 13.0–17.0)
Immature Granulocytes: 0 %
Lymphocytes Relative: 13 %
Lymphs Abs: 1.8 K/uL (ref 0.7–4.0)
MCH: 29.3 pg (ref 26.0–34.0)
MCHC: 32.6 g/dL (ref 30.0–36.0)
MCV: 90 fL (ref 80.0–100.0)
Monocytes Absolute: 0.8 K/uL (ref 0.1–1.0)
Monocytes Relative: 6 %
Neutro Abs: 10.8 K/uL — ABNORMAL HIGH (ref 1.7–7.7)
Neutrophils Relative %: 77 %
Platelet Count: 189 K/uL (ref 150–400)
RBC: 4.3 MIL/uL (ref 4.22–5.81)
RDW: 14.6 % (ref 11.5–15.5)
WBC Count: 14 K/uL — ABNORMAL HIGH (ref 4.0–10.5)
nRBC: 0 % (ref 0.0–0.2)

## 2023-08-26 LAB — RAD ONC ARIA SESSION SUMMARY
Course Elapsed Days: 0
Plan Fractions Treated to Date: 1
Plan Prescribed Dose Per Fraction: 2 Gy
Plan Total Fractions Prescribed: 11
Plan Total Prescribed Dose: 22 Gy
Reference Point Dosage Given to Date: 2 Gy
Reference Point Session Dosage Given: 2 Gy
Session Number: 1

## 2023-08-26 MED ORDER — OXYCODONE HCL 10 MG PO TABS: ORAL_TABLET | ORAL | 0 refills | Status: DC

## 2023-08-26 MED ORDER — SODIUM CHLORIDE 0.9 % IV SOLN
INTRAVENOUS | Status: DC
Start: 2023-08-26 — End: 2023-08-26

## 2023-08-26 MED ORDER — SODIUM CHLORIDE 0.9 % IV SOLN
500.0000 mg/m2/d | INTRAVENOUS | Status: DC
  Administered 2023-08-26: 4000 mg via INTRAVENOUS
  Filled 2023-08-26: qty 80

## 2023-08-26 MED ORDER — MITOMYCIN CHEMO IV INJECTION 20 MG
6.0000 mg/m2 | Freq: Once | INTRAVENOUS | Status: AC
  Administered 2023-08-26: 12 mg via INTRAVENOUS
  Filled 2023-08-26: qty 24

## 2023-08-26 MED ORDER — PROCHLORPERAZINE MALEATE 10 MG PO TABS
10.0000 mg | ORAL_TABLET | Freq: Once | ORAL | Status: AC
  Administered 2023-08-26: 10 mg via ORAL
  Filled 2023-08-26: qty 1

## 2023-08-26 MED ORDER — SODIUM CHLORIDE 0.9 % IV SOLN: INTRAVENOUS | Status: DC

## 2023-08-26 NOTE — Progress Notes (Signed)
 OK to treat per Dr Ezzard despite creatinine of 2.63.  Add one liter of NS over 2 hours.

## 2023-08-26 NOTE — Patient Instructions (Signed)
 Mitomycin Injection What is this medication? MITOMYCIN (mye toe MYE sin) treats stomach cancer and pancreatic cancer. It works by slowing down the growth of cancer cells. This medicine may be used for other purposes; ask your health care provider or pharmacist if you have questions. COMMON BRAND NAME(S): Mutamycin What should I tell my care team before I take this medication? They need to know if you have any of these conditions: Bleeding disorders Infection, such as chickenpox, cold sores, herpes Low blood counts, such as low white cells, platelets, red blood cells Kidney disease An unusual or allergic reaction to mitomycin, other medications, foods, dyes, or preservatives Pregnant or trying to get pregnant Breastfeeding How should I use this medication? This medication is injected into a vein. It is given by your care team in a hospital or clinic setting. Talk to your care team about the use of this medication in children. Special care may be needed. Overdosage: If you think you have taken too much of this medicine contact a poison control center or emergency room at once. NOTE: This medicine is only for you. Do not share this medicine with others. What if I miss a dose? Keep appointments for follow-up doses. It is important not to miss your dose. Call your care team if you are unable to keep an appointment. What may interact with this medication? Interactions are not expected. This list may not describe all possible interactions. Give your health care provider a list of all the medicines, herbs, non-prescription drugs, or dietary supplements you use. Also tell them if you smoke, drink alcohol, or use illegal drugs. Some items may interact with your medicine. What should I watch for while using this medication? Your condition will be monitored carefully while you are receiving this medication. You may need blood work while taking this medication. This medication may make you feel  generally unwell. This is not uncommon as chemotherapy can affect healthy cells as well as cancer cells. Report any side effects. Continue your course of treatment even though you feel ill unless your care team tells you to stop. This medication may increase your risk of getting an infection. Call your care team for advice if you get a fever, chills, sore throat, or other symptoms of a cold or flu. Do not treat yourself. Try to avoid being around people who are sick. Avoid taking medications that contain aspirin, acetaminophen, ibuprofen, naproxen, or ketoprofen unless instructed by your care team. These medications may hide a fever. This medication may increase your risk to bruise or bleed. Call your care team if you notice any unusual bleeding. Be careful brushing or flossing your teeth or using a toothpick because you may get an infection or bleed more easily. If you have any dental work done, tell your dentist you are receiving this medication. Talk to your care team if you may be pregnant. Serious birth defects can occur if you take this medication during pregnancy. Contraception is recommended while taking this medication. Your care team can help you find the option that works for you. Do not breastfeed while taking this medication. What side effects may I notice from receiving this medication? Side effects that you should report to your care team as soon as possible: Allergic reactions--skin rash, itching, hives, swelling of the face, lips, tongue, or throat Dry cough, shortness of breath or trouble breathing Infection--fever, chills, cough, sore throat, wounds that don't heal, pain or trouble when passing urine, general feeling of discomfort or being unwell Kidney  injury--decrease in the amount of urine, swelling of the ankles, hands, or feet Low red blood cell level--unusual weakness or fatigue, dizziness, headache, trouble breathing Stomach pain, bloody diarrhea, pale skin, unusual weakness  or fatigue, decrease in the amount of urine, which may be signs of hemolytic uremic syndrome Unusual bruising or bleeding Side effects that usually do not require medical attention (report these to your care team if they continue or are bothersome): Diarrhea Hair loss Loss of appetite with weight loss Nausea Pain, redness, or swelling with sores inside the mouth or throat This list may not describe all possible side effects. Call your doctor for medical advice about side effects. You may report side effects to FDA at 1-800-FDA-1088. Where should I keep my medication? This medication is given in a hospital or clinic. It will not be stored at home. NOTE: This sheet is a summary. It may not cover all possible information. If you have questions about this medicine, talk to your doctor, pharmacist, or health care provider.  2024 Elsevier/Gold Standard (2021-05-11 00:00:00)

## 2023-08-26 NOTE — Progress Notes (Signed)
 Ok to proceed with chemo today despite creatinine of 2.63 (clearance = 23 ml/min) per Dr. Ezzard.  Dose of mitomycin  will be reduced by 50% per recommendations listed on abdominal key (see link below).  [email or web address]

## 2023-08-26 NOTE — Progress Notes (Signed)
..  Pharmacist Chemotherapy Monitoring - Initial Assessment    Mitomycin  dose reduced by 50% due to baseline renal insufficiency.  Anticipated start date: 08/26/2023  The following has been reviewed per standard work regarding the patient's treatment regimen: The patient's diagnosis, treatment plan and drug doses, and organ/hematologic function Lab orders and baseline tests specific to treatment regimen  The treatment plan start date, drug sequencing, and pre-medications Prior authorization status  Patient's documented medication list, including drug-drug interaction screen and prescriptions for anti-emetics and supportive care specific to the treatment regimen The drug concentrations, fluid compatibility, administration routes, and timing of the medications to be used The patient's access for treatment and lifetime cumulative dose history, if applicable  The patient's medication allergies and previous infusion related reactions, if applicable   Changes made to treatment plan:  N/A  Follow up needed:  N/A   Erik Keith, [facility], 08/26/2023  11:04 AM

## 2023-08-27 ENCOUNTER — Telehealth: Payer: Self-pay

## 2023-08-27 ENCOUNTER — Ambulatory Visit
Admission: RE | Admit: 2023-08-27 | Discharge: 2023-08-27 | Disposition: A | Discharge status: Home / Self Care | Source: Ambulatory Visit | Attending: Radiation Oncology

## 2023-08-27 ENCOUNTER — Ambulatory Visit
Admission: RE | Admit: 2023-08-27 | Discharge: 2023-08-27 | Disposition: A | Discharge status: Home / Self Care | Source: Ambulatory Visit | Attending: Radiation Oncology | Admitting: Radiation Oncology

## 2023-08-27 ENCOUNTER — Other Ambulatory Visit: Payer: Self-pay

## 2023-08-27 DIAGNOSIS — D472 Monoclonal gammopathy: Secondary | ICD-10-CM | POA: Diagnosis not present

## 2023-08-27 DIAGNOSIS — C672 Malignant neoplasm of lateral wall of bladder: Secondary | ICD-10-CM | POA: Diagnosis not present

## 2023-08-27 DIAGNOSIS — Z510 Encounter for antineoplastic radiation therapy: Secondary | ICD-10-CM | POA: Diagnosis not present

## 2023-08-27 DIAGNOSIS — Z5111 Encounter for antineoplastic chemotherapy: Secondary | ICD-10-CM | POA: Diagnosis not present

## 2023-08-27 DIAGNOSIS — Z87891 Personal history of nicotine dependence: Secondary | ICD-10-CM | POA: Diagnosis not present

## 2023-08-27 DIAGNOSIS — N1330 Unspecified hydronephrosis: Secondary | ICD-10-CM | POA: Diagnosis not present

## 2023-08-27 LAB — RAD ONC ARIA SESSION SUMMARY
Course Elapsed Days: 1
Plan Fractions Treated to Date: 2
Plan Prescribed Dose Per Fraction: 2 Gy
Plan Total Fractions Prescribed: 11
Plan Total Prescribed Dose: 22 Gy
Reference Point Dosage Given to Date: 4 Gy
Reference Point Session Dosage Given: 2 Gy
Session Number: 2

## 2023-08-27 NOTE — Telephone Encounter (Signed)
 I spoke with pt. He states he feels pretty good. He had some nausea yesterday afternoon but took a nausea pill and it resolved. No emesis. He denies skin rash, itching, cough, SOB, chills, fever, constipation, and diarrhea. We discussed the possibility that he Vallely develop mouth sores, and to let us  know, as we can prescribe mouth wash. I told him he would definitely have fatigue, and to pace himself. If there is something he needs to do during the day, that mornings [place] be better, as he will have more energy then. I reminded him to call us  if he were to develop temp of 100.4 or higher, day or night. He verbalized understanding. I encouraged him to tell staff (while here daily for radiation) if he doesn't feel well.

## 2023-08-28 ENCOUNTER — Ambulatory Visit
Admission: RE | Admit: 2023-08-28 | Discharge: 2023-08-28 | Disposition: A | Discharge status: Home / Self Care | Source: Ambulatory Visit | Attending: Radiation Oncology

## 2023-08-28 ENCOUNTER — Encounter

## 2023-08-28 ENCOUNTER — Other Ambulatory Visit: Payer: Self-pay

## 2023-08-28 DIAGNOSIS — Z510 Encounter for antineoplastic radiation therapy: Secondary | ICD-10-CM | POA: Diagnosis not present

## 2023-08-28 DIAGNOSIS — N1330 Unspecified hydronephrosis: Secondary | ICD-10-CM | POA: Diagnosis not present

## 2023-08-28 DIAGNOSIS — Z87891 Personal history of nicotine dependence: Secondary | ICD-10-CM | POA: Diagnosis not present

## 2023-08-28 DIAGNOSIS — C672 Malignant neoplasm of lateral wall of bladder: Secondary | ICD-10-CM | POA: Diagnosis not present

## 2023-08-28 DIAGNOSIS — D472 Monoclonal gammopathy: Secondary | ICD-10-CM | POA: Diagnosis not present

## 2023-08-28 DIAGNOSIS — Z5111 Encounter for antineoplastic chemotherapy: Secondary | ICD-10-CM | POA: Diagnosis not present

## 2023-08-28 LAB — RAD ONC ARIA SESSION SUMMARY
Course Elapsed Days: 2
Plan Fractions Treated to Date: 3
Plan Prescribed Dose Per Fraction: 2 Gy
Plan Total Fractions Prescribed: 11
Plan Total Prescribed Dose: 22 Gy
Reference Point Dosage Given to Date: 6 Gy
Reference Point Session Dosage Given: 2 Gy
Session Number: 3

## 2023-08-29 ENCOUNTER — Other Ambulatory Visit: Payer: Self-pay

## 2023-08-29 ENCOUNTER — Ambulatory Visit
Admission: RE | Admit: 2023-08-29 | Discharge: 2023-08-29 | Disposition: A | Discharge status: Home / Self Care | Source: Ambulatory Visit | Attending: Radiation Oncology

## 2023-08-29 DIAGNOSIS — Z5111 Encounter for antineoplastic chemotherapy: Secondary | ICD-10-CM | POA: Diagnosis not present

## 2023-08-29 DIAGNOSIS — Z87891 Personal history of nicotine dependence: Secondary | ICD-10-CM | POA: Diagnosis not present

## 2023-08-29 DIAGNOSIS — N1330 Unspecified hydronephrosis: Secondary | ICD-10-CM | POA: Diagnosis not present

## 2023-08-29 DIAGNOSIS — Z510 Encounter for antineoplastic radiation therapy: Secondary | ICD-10-CM | POA: Diagnosis not present

## 2023-08-29 DIAGNOSIS — D472 Monoclonal gammopathy: Secondary | ICD-10-CM | POA: Diagnosis not present

## 2023-08-29 DIAGNOSIS — C672 Malignant neoplasm of lateral wall of bladder: Secondary | ICD-10-CM | POA: Diagnosis not present

## 2023-08-29 LAB — RAD ONC ARIA SESSION SUMMARY
Course Elapsed Days: 3
Plan Fractions Treated to Date: 4
Plan Prescribed Dose Per Fraction: 2 Gy
Plan Total Fractions Prescribed: 11
Plan Total Prescribed Dose: 22 Gy
Reference Point Dosage Given to Date: 8 Gy
Reference Point Session Dosage Given: 2 Gy
Session Number: 4

## 2023-08-30 ENCOUNTER — Inpatient Hospital Stay

## 2023-08-30 ENCOUNTER — Ambulatory Visit
Admission: RE | Admit: 2023-08-30 | Discharge: 2023-08-30 | Disposition: A | Discharge status: Home / Self Care | Source: Ambulatory Visit | Attending: Radiation Oncology | Admitting: Radiation Oncology

## 2023-08-30 ENCOUNTER — Other Ambulatory Visit: Payer: Self-pay

## 2023-08-30 DIAGNOSIS — Z5111 Encounter for antineoplastic chemotherapy: Secondary | ICD-10-CM | POA: Diagnosis not present

## 2023-08-30 DIAGNOSIS — C672 Malignant neoplasm of lateral wall of bladder: Secondary | ICD-10-CM | POA: Diagnosis not present

## 2023-08-30 DIAGNOSIS — D472 Monoclonal gammopathy: Secondary | ICD-10-CM | POA: Diagnosis not present

## 2023-08-30 DIAGNOSIS — N1330 Unspecified hydronephrosis: Secondary | ICD-10-CM | POA: Diagnosis not present

## 2023-08-30 DIAGNOSIS — Z510 Encounter for antineoplastic radiation therapy: Secondary | ICD-10-CM | POA: Diagnosis not present

## 2023-08-30 DIAGNOSIS — Z87891 Personal history of nicotine dependence: Secondary | ICD-10-CM | POA: Diagnosis not present

## 2023-08-30 LAB — RAD ONC ARIA SESSION SUMMARY
Course Elapsed Days: 4
Plan Fractions Treated to Date: 5
Plan Prescribed Dose Per Fraction: 2 Gy
Plan Total Fractions Prescribed: 11
Plan Total Prescribed Dose: 22 Gy
Reference Point Dosage Given to Date: 10 Gy
Reference Point Session Dosage Given: 2 Gy
Session Number: 5

## 2023-09-01 DIAGNOSIS — I129 Hypertensive chronic kidney disease with stage 1 through stage 4 chronic kidney disease, or unspecified chronic kidney disease: Secondary | ICD-10-CM | POA: Diagnosis not present

## 2023-09-01 DIAGNOSIS — E1169 Type 2 diabetes mellitus with other specified complication: Secondary | ICD-10-CM | POA: Diagnosis not present

## 2023-09-01 DIAGNOSIS — K219 Gastro-esophageal reflux disease without esophagitis: Secondary | ICD-10-CM | POA: Diagnosis not present

## 2023-09-03 ENCOUNTER — Other Ambulatory Visit: Payer: Self-pay

## 2023-09-03 ENCOUNTER — Ambulatory Visit
Admission: RE | Admit: 2023-09-03 | Discharge: 2023-09-03 | Disposition: A | Discharge status: Home / Self Care | Source: Ambulatory Visit | Attending: Radiation Oncology | Admitting: Radiation Oncology

## 2023-09-03 ENCOUNTER — Inpatient Hospital Stay: Attending: Oncology | Admitting: Dietician

## 2023-09-03 DIAGNOSIS — N1330 Unspecified hydronephrosis: Secondary | ICD-10-CM | POA: Insufficient documentation

## 2023-09-03 DIAGNOSIS — D472 Monoclonal gammopathy: Secondary | ICD-10-CM | POA: Insufficient documentation

## 2023-09-03 DIAGNOSIS — Z87891 Personal history of nicotine dependence: Secondary | ICD-10-CM | POA: Insufficient documentation

## 2023-09-03 DIAGNOSIS — C679 Malignant neoplasm of bladder, unspecified: Secondary | ICD-10-CM | POA: Insufficient documentation

## 2023-09-03 DIAGNOSIS — Z510 Encounter for antineoplastic radiation therapy: Secondary | ICD-10-CM | POA: Insufficient documentation

## 2023-09-03 DIAGNOSIS — Z23 Encounter for immunization: Secondary | ICD-10-CM | POA: Diagnosis not present

## 2023-09-03 DIAGNOSIS — R110 Nausea: Secondary | ICD-10-CM | POA: Diagnosis not present

## 2023-09-03 DIAGNOSIS — C672 Malignant neoplasm of lateral wall of bladder: Secondary | ICD-10-CM | POA: Insufficient documentation

## 2023-09-03 DIAGNOSIS — Z5111 Encounter for antineoplastic chemotherapy: Secondary | ICD-10-CM | POA: Insufficient documentation

## 2023-09-03 LAB — RAD ONC ARIA SESSION SUMMARY
Course Elapsed Days: 8
Plan Fractions Treated to Date: 6
Plan Prescribed Dose Per Fraction: 2 Gy
Plan Total Fractions Prescribed: 11
Plan Total Prescribed Dose: 22 Gy
Reference Point Dosage Given to Date: 12 Gy
Reference Point Session Dosage Given: 2 Gy
Session Number: 6

## 2023-09-03 NOTE — Progress Notes (Signed)
 Nutrition Assessment:  Met with patient and spouse after radiation PUT.   Reason for Assessment: New Patient Assessment   ASSESSMENT: Patient is an 83 year old male with bladder cancer who is getting concurrent chemo radiation with 5-fluorouracil  . He has PMHx that includes DM2, CKD, CAD, HTN, and HLD. Patient eating 3 meals a day with smaller meals and portions lately.  He just had Muscle milk shake and enjoyed it yesterday and plans to buy more at [employer]. Usual PO: likes soup, likes [employer] spicy biscuits Breakfast: Bacon eggs grits with slice tomatoes or grilled cheddar biscuit Lunch: Potato soup or cream of chicken soup Supper is very light: Sandwich for supper Water:   coffee 10oz, Water 20-24oz,   Nutrition Focused Physical Exam: unable to perform NFPE   Medications: Vit B12   Labs: 08/26/23 BUN 33, Creat 2.63, GFR 23   Anthropometrics:  weight loss 15# (7.9%) past 5 months   Height: 70" Weight: 09/03/23  174.1# UBW: 195-200# BMI: 26.03  Estimated Energy Needs  Kcals: 2600-3000 Protein: 68-85 g Fluid: 3 L   NUTRITION DIAGNOSIS: Food and Nutrition Related Knowledge Deficit related to cancer and associated treatments as evidenced by no prior need for nutrition related information.   INTERVENTION:   Relayed that nutrition services are wrap around service provided at no charge and encouraged continued communication if experiencing any nutritional impact symptoms (NIS). Educated on importance of adequate nourishment with calorie and protein energy intake with nutrient dense foods when possible to maintain weight/strength and QOL.   Discussed ways to add calories to foods (adding cheese, cooking with butter, creamy sauces/gravy)  Encouraged more small frequent meals/snacks   Suggested oral nutrition supplement limit 30 g to QD Discussed strategies for increasing fluids to  preserve renal function  Provided tip sheet for High Calorie Snacking, encouraged healthy fats  to increase calories without impacting blood sugars, he agree to trial peanut butter sandwiches after dinner contact information provided.  MONITORING, EVALUATION, GOAL: weight trends, nutrition impact symptoms, PO intake, labs  Goal is weight maintenance.  Next Visit: In person 2 weeks after radiation PUT  Micheline Craven, RDN, LDN Registered Dietitian, [facility] Part Time Remote (Usual office hours: Tuesday-Thursday) Mobile: [phone]

## 2023-09-04 ENCOUNTER — Encounter: Payer: Self-pay | Admitting: Cardiovascular Disease

## 2023-09-04 ENCOUNTER — Ambulatory Visit: Attending: Cardiovascular Disease | Admitting: Cardiovascular Disease

## 2023-09-04 ENCOUNTER — Ambulatory Visit
Admission: RE | Admit: 2023-09-04 | Discharge: 2023-09-04 | Disposition: A | Discharge status: Home / Self Care | Source: Ambulatory Visit | Attending: Radiation Oncology

## 2023-09-04 ENCOUNTER — Other Ambulatory Visit: Payer: Self-pay

## 2023-09-04 ENCOUNTER — Ambulatory Visit: Admitting: Radiation Oncology

## 2023-09-04 VITALS — BP 122/74 | HR 68 | Ht 70.0 in | Wt 174.2 lb

## 2023-09-04 DIAGNOSIS — I25110 Atherosclerotic heart disease of native coronary artery with unstable angina pectoris: Secondary | ICD-10-CM

## 2023-09-04 DIAGNOSIS — N1330 Unspecified hydronephrosis: Secondary | ICD-10-CM | POA: Diagnosis not present

## 2023-09-04 DIAGNOSIS — R002 Palpitations: Secondary | ICD-10-CM | POA: Diagnosis not present

## 2023-09-04 DIAGNOSIS — Z23 Encounter for immunization: Secondary | ICD-10-CM | POA: Diagnosis not present

## 2023-09-04 DIAGNOSIS — Z510 Encounter for antineoplastic radiation therapy: Secondary | ICD-10-CM | POA: Diagnosis not present

## 2023-09-04 DIAGNOSIS — Z953 Presence of xenogenic heart valve: Secondary | ICD-10-CM | POA: Diagnosis not present

## 2023-09-04 DIAGNOSIS — R110 Nausea: Secondary | ICD-10-CM | POA: Diagnosis not present

## 2023-09-04 DIAGNOSIS — Z5111 Encounter for antineoplastic chemotherapy: Secondary | ICD-10-CM | POA: Diagnosis not present

## 2023-09-04 DIAGNOSIS — I10 Essential (primary) hypertension: Secondary | ICD-10-CM | POA: Diagnosis not present

## 2023-09-04 LAB — RAD ONC ARIA SESSION SUMMARY
Course Elapsed Days: 9
Plan Fractions Treated to Date: 7
Plan Prescribed Dose Per Fraction: 2 Gy
Plan Total Fractions Prescribed: 11
Plan Total Prescribed Dose: 22 Gy
Reference Point Dosage Given to Date: 14 Gy
Reference Point Session Dosage Given: 2 Gy
Session Number: 7

## 2023-09-04 NOTE — Progress Notes (Signed)
 Chief Complaint  Patient presents with   Follow-up    CAD   History of Present Illness:83 yo male with history of severe aortic stenosis s/p AVR, CAD s/p CABG, GERD, HTN, hyperlipidemia, iron deficiency anemia, arthritis, diabetes who is here today for follow up. I saw him as a new patient for further discussion regarding his aortic stenosis and possible TAVR 09/05/17. He had been followed in the past by Dr. Monetta in [place] but most recently had been seen by Dr.McGukin in [facility] when Dr. Monetta left that practice. Echo June 2019 at [facility] with normal LV systolic function, LVEF=60-65%, mild LVH, severe aortic stenosis with mean gradient 45 mmHg and AVA 0.61 cm2. There was mild aortic valve insufficiency. He was at [facility] August 2019 to have shoulder surgery and given his severe aortic stenosis his shoulder surgery was cancelled. He also has CAD and has had a stent placed in the LAD in 2008 and a stent placed in the RCA in 2009.  Workup for TAVR was started when I saw him in September 2019. Cardiac cath 09/12/17 with severe three vessel CAD. We discussed CABG and surgical AVR. He was seen by Dr. Dusty with CT surgery 09/20/17 and surgical AVR with CABG was planned. He then presented to [facility] 10/02/17 with an acute lateral STEMI. This occurred after finding his son dead. His Circumflex was occluded. His Circumflex was treated with balloon angioplasty and emergent 4V CABG and AVR was performed 10/03/17. Beta blocker stopped post op due to bradycardia. He had post-op atrial fibrillation treated with amiodarone . He was in sinus at time of discharge. He had some chest pain in the Spring of 2021. Echo March 2021 with LVEF=60-65%, normally functioning AVR. Imdur  and Norvasc  added and chest pain resolved. Beta blocker stopped due to bradycardia. Echo July 2024 with LVEF=60-65%. Mild MR. AVR working well. Nuclear stress test July 2024 without ischemia. Cardiac monitor July 2024 with sinus and one 5 beat  run of SVT. He has been diagnosed with bladder cancer and is undergoing treatment with chemotherapy.   He is here today for follow up. The patient denies any chest pain, dyspnea, palpitations, lower extremity edema, orthopnea, PND, dizziness, near syncope or syncope.    Primary Care Physician: Keren Vicenta BRAVO, MD  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   23 mm Edwards Inspiris Resilia stented bioprosthetic tissue valve   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   LIMA to LAD, Sequential SVG to OM1-OM2, SVG to PDA, EVH via right thigh   Type 2 diabetes mellitus (HCC)     Past Surgical History:  Procedure Laterality Date   AORTIC VALVE REPLACEMENT N/A 10/03/2017   Procedure: AORTIC VALVE REPLACEMENT (AVR);  Surgeon: Dusty Sudie DEL, MD;  Location: [facility] OR;  Service: Open Heart Surgery;  Laterality: N/A;   APPENDECTOMY     BACK SURGERY  x3   CORONARY ARTERY BYPASS GRAFT N/A 10/03/2017   Procedure: CORONARY ARTERY BYPASS GRAFTING (CABG)x three, USING LEFT INTERNAL MAMMARY ARTERY AND RIGHT GREATER SAPHENOUS VEIN HARVESTED ENDOSCOPICALLY;  Surgeon: Dusty Sudie DEL, MD;  Location: [facility] OR;  Service: Open Heart Surgery;  Laterality: N/A;   CORONARY/GRAFT ACUTE MI REVASCULARIZATION N/A 10/02/2017   Procedure: Coronary/Graft Acute MI Revascularization;  Surgeon: Dann Candyce RAMAN, MD;  Location: [facility]  INVASIVE CV LAB;  Service: Cardiovascular;  Laterality: N/A;   IR NEPHROSTOMY PLACEMENT RIGHT  08/20/2023   LEFT HEART CATH AND CORONARY ANGIOGRAPHY N/A 10/02/2017   Procedure: LEFT HEART CATH AND CORONARY ANGIOGRAPHY;  Surgeon: Dann Candyce RAMAN, MD;  Location: [facility] INVASIVE CV LAB;  Service: Cardiovascular;  Laterality: N/A;   LUMBAR LAMINECTOMY/DECOMPRESSION MICRODISCECTOMY N/A 12/04/2021   Procedure: L2-3 DECOMPRESSION;  Surgeon: Burnetta Aures, MD;  Location: [facility] OR;  Service: Orthopedics;  Laterality: N/A;  2.5 hrs 3 C-Bed   RIGHT/LEFT HEART CATH AND CORONARY ANGIOGRAPHY N/A 09/12/2017   Procedure: RIGHT/LEFT HEART CATH AND CORONARY ANGIOGRAPHY;  Surgeon: Verlin Lonni BIRCH, MD;  Location: [facility] INVASIVE CV LAB;  Service: Cardiovascular;  Laterality: N/A;   TONSILLECTOMY      Current Outpatient Medications  Medication Sig Dispense Refill   amLODipine  (NORVASC ) 10 MG tablet TAKE 1 TABLET(10 MG) BY MOUTH DAILY 90 tablet 0   aspirin  EC 81 MG tablet Take 81 mg by mouth daily.     atorvastatin  (LIPITOR) 40 MG tablet Take 40 mg by mouth at bedtime.     Cyanocobalamin (B-12) 1000 MCG TABS Take 1,000 mcg by mouth daily.     diphenhydrAMINE  (BENADRYL ) 50 MG tablet Take 50 mg by mouth every 8 (eight) hours as needed.     Ferrous Sulfate  (IRON) 325 (65 Fe) MG TABS Take by mouth. 1 tablet 3 times a week     gabapentin  (NEURONTIN ) 300 MG capsule Take 300 mg by mouth 2 (two) times daily. (Patient taking differently: Take 300 mg by mouth 2 (two) times daily. Taking as needed)     glimepiride (AMARYL) 2 MG tablet Take 2 mg by mouth daily with breakfast.     glucose blood (ONETOUCH ULTRA) test strip USE TO CHECK BLOOD SUGAR ONCE A DAY E11.9     HYDROcodone -acetaminophen  (NORCO) 10-325 MG tablet Take 1 tablet by mouth every 6 (six) hours as needed.     isosorbide  mononitrate (IMDUR ) 30 MG 24 hr tablet TAKE 1 TABLET(30 MG) BY MOUTH DAILY 90 tablet 3   lidocaine -prilocaine  (EMLA ) cream Apply to affected  area once 30 g 3   methocarbamol  (ROBAXIN ) 500 MG tablet Take 500 mg by mouth every 8 (eight) hours as needed.     nitroGLYCERIN  (NITROSTAT ) 0.4 MG SL tablet Place 1 tablet (0.4 mg total) under the tongue every 5 (five) minutes as needed for chest pain. 25 tablet 3   nystatin cream (MYCOSTATIN) Apply 1 Application topically as needed.     ondansetron  (ZOFRAN ) 8 MG tablet Take 1 tablet (8 mg total) by mouth every 8 (eight) hours as needed for nausea or vomiting. 30 tablet 1   OPTIFIBER LEAN PO Take by mouth.     Oxycodone  HCl 10 MG TABS One tab po q4-6hr prn pain 60 tablet 0   pantoprazole  (PROTONIX ) 40 MG tablet Take 40 mg by mouth daily.     polyethylene glycol powder (GLYCOLAX /MIRALAX ) 17 GM/SCOOP powder Take by mouth once.     predniSONE  (  DELTASONE ) 50 MG tablet Please take a dose of prednisone  50 mg at 1am and 7am in preparation for contrast administration for procedure. 2 tablet 0   prochlorperazine  (COMPAZINE ) 10 MG tablet Take 1 tablet (10 mg total) by mouth every 6 (six) hours as needed for nausea or vomiting. 30 tablet 1   tamsulosin (FLOMAX) 0.4 MG CAPS capsule Take 0.4 mg by mouth daily.     URIBEL 81.6 MG TABS Take 1 tablet by mouth every 8 (eight) hours as needed.     zolpidem  (AMBIEN ) 10 MG tablet Take 10 mg by mouth at bedtime as needed.     No current facility-administered medications for this visit.   Facility-Administered Medications Ordered in Other Visits  Medication Dose Route Frequency Provider Last Rate Last Admin   regadenoson  (LEXISCAN ) injection SOLN 0.4 mg  0.4 mg Intravenous Once Nishan, Hazim C, MD       technetium tetrofosmin  (TC-MYOVIEW ) injection 31 millicurie  31 millicurie Intravenous Once PRN Nishan, Matvey C, MD        Allergies  Allergen Reactions   Iodinated Contrast Media Hives and Other (See Comments)    hives   Other Dermatitis    IV DYE   Sucralfate Other (See Comments)    nausea   Penicillins Hives    Has patient had a PCN reaction causing  immediate rash, facial/tongue/throat swelling, SOB or lightheadedness with hypotension: unkn Has patient had a PCN reaction causing severe rash involving mucus membranes or skin necrosis: unkn Has patient had a PCN reaction that required hospitalization: unkn Has patient had a PCN reaction occurring within the last 10 years: no If all of the above answers are NO, then Costen proceed with Cephalosporin use.     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  Tobacco Use   Smoking status: Former    Current packs/day: 0.00    Types: Cigarettes    Start date: 1962    Quit date: 2002    Years since quitting: 23.6   Smokeless tobacco: Never  Vaping Use   Vaping status: Never Used  Substance and Sexual Activity   Alcohol use: Never   Drug use: Never   Sexual activity: Not Currently  Other Topics Concern   Not on file  Social History Narrative   Not on file   Social Drivers of Health   Financial Resource Strain: Low Risk  (05/31/2023)   Received from [facility]   Overall Financial Resource Strain (CARDIA)    Difficulty of Paying Living Expenses: Not hard at all  Food Insecurity: Low Risk  (07/26/2023)   Received from [facility]   Hunger Vital Sign    Within the past 12 months, you worried that your food would run out before you got money to buy more: Never true    Within the past 12 months, the food you bought just didn't last and you didn't have money to get more. : Never true  Transportation Needs: No Transportation Needs (08/13/2023)   PRAPARE - Administrator, Civil Service (Medical): No    Lack of Transportation (Non-Medical): No  Recent Concern: Transportation Needs - Unmet Transportation Needs (07/26/2023)   Received from [employer]    In  the past 12 months, has lack of reliable transportation kept you from medical appointments, meetings, work or from getting things needed for daily living? : Yes  Physical Activity: Not on file  Stress: Not on file  Social Connections: Not on file  Intimate Partner Violence: Not on file    Family History  Problem Relation Age of Onset   CVA Father    Heart attack Sister    Heart attack Brother        3 brothers with CAD    Review of Systems:  As stated in the HPI and otherwise negative.   BP 122/74   Pulse 68   Ht 5' 10 (1.778 m)   Wt 174 lb 3.2 oz (79 kg)   SpO2 96%   BMI 25.00 kg/m   Physical Examination: General: Well developed, well nourished, NAD  HEENT: OP clear, mucus membranes moist  SKIN: warm, dry. No rashes. Neuro: No focal deficits  Musculoskeletal: Muscle strength 5/5 all ext  Psychiatric: Mood and affect normal  Neck: No JVD, no carotid bruits, no thyromegaly, no lymphadenopathy.  Lungs:Clear bilaterally, no wheezes, rhonci, crackles Cardiovascular: Regular rate and rhythm. No murmurs, gallops or rubs. Abdomen:Soft. Bowel sounds present. Non-tender.  Extremities: No lower extremity edema. Pulses are 2 + in the bilateral DP/PT.  EKG:  EKG is ordered today. The ekg ordered today demonstrates   EKG Interpretation Date/Time:  Wednesday September 04 2023 16:32:05 EDT Ventricular Rate:  68 PR Interval:  188 QRS Duration:  96 QT Interval:  372 QTC Calculation: 395 R Axis:   63  Text Interpretation: Normal sinus rhythm Non-specific T wave abnormality Confirmed by Verlin Bruckner [phone]) on 09/04/2023 4:37:02 PM    Recent Labs: 08/26/2023: ALT 11; BUN 33; Creatinine 2.63; Hemoglobin 12.6; Platelet Count 189; Potassium 4.1; Sodium 138   Lipid Panel    Component Value Date/Time   CHOL 115 03/23/2019 1130   TRIG 215 (H) 03/23/2019 1130   HDL 32 (L) 03/23/2019 1130   CHOLHDL 3.6 03/23/2019 1130   LDLCALC 48 03/23/2019 1130      Wt Readings from Last 3 Encounters:  09/04/23 174 lb 3.2 oz (79 kg)  08/20/23 176 lb (79.8 kg)  08/13/23 178 lb (80.7 kg)    Assessment and Plan:   1. Aortic valve disease: He is s/p AVR in 2019. Continue ASA. Continue SBE prophylaxis when indicated.   2. CAD s/p CABG without angina: No chest pain. Continue ASA, statin, Imdur  and Norvasc . Beta blocker stopped due to bradycardia.   3. Post-op atrial fibrillation: Sinus today. No recurrence of atrial fib since his post-operative period  4. Hyperlipidemia: LDL 59 in June 2025. Continue Lipitor.   Labs/ tests ordered today include:   Orders Placed This Encounter  Procedures   EKG 12-Lead   Disposition:   F/U with me in 12 months  Signed, Bruckner Verlin, MD 09/04/2023 4:54 PM    [facility] [address] [place], [place], [place]  [address] Phone: [phone]; Fax: [phone]

## 2023-09-05 ENCOUNTER — Other Ambulatory Visit: Payer: Self-pay

## 2023-09-05 ENCOUNTER — Ambulatory Visit
Admission: RE | Admit: 2023-09-05 | Discharge: 2023-09-05 | Disposition: A | Discharge status: Home / Self Care | Source: Ambulatory Visit | Attending: Radiation Oncology

## 2023-09-05 DIAGNOSIS — Z510 Encounter for antineoplastic radiation therapy: Secondary | ICD-10-CM | POA: Diagnosis not present

## 2023-09-05 DIAGNOSIS — Z5111 Encounter for antineoplastic chemotherapy: Secondary | ICD-10-CM | POA: Diagnosis not present

## 2023-09-05 DIAGNOSIS — Z23 Encounter for immunization: Secondary | ICD-10-CM | POA: Diagnosis not present

## 2023-09-05 DIAGNOSIS — C679 Malignant neoplasm of bladder, unspecified: Secondary | ICD-10-CM | POA: Diagnosis not present

## 2023-09-05 DIAGNOSIS — N1330 Unspecified hydronephrosis: Secondary | ICD-10-CM | POA: Diagnosis not present

## 2023-09-05 DIAGNOSIS — R110 Nausea: Secondary | ICD-10-CM | POA: Diagnosis not present

## 2023-09-05 LAB — RAD ONC ARIA SESSION SUMMARY
Course Elapsed Days: 10
Plan Fractions Treated to Date: 8
Plan Prescribed Dose Per Fraction: 2 Gy
Plan Total Fractions Prescribed: 11
Plan Total Prescribed Dose: 22 Gy
Reference Point Dosage Given to Date: 16 Gy
Reference Point Session Dosage Given: 2 Gy
Session Number: 8

## 2023-09-06 ENCOUNTER — Other Ambulatory Visit: Payer: Self-pay

## 2023-09-06 ENCOUNTER — Ambulatory Visit
Admission: RE | Admit: 2023-09-06 | Discharge: 2023-09-06 | Disposition: A | Discharge status: Home / Self Care | Source: Ambulatory Visit | Attending: Radiation Oncology | Admitting: Radiation Oncology

## 2023-09-06 DIAGNOSIS — Z23 Encounter for immunization: Secondary | ICD-10-CM | POA: Diagnosis not present

## 2023-09-06 DIAGNOSIS — N1330 Unspecified hydronephrosis: Secondary | ICD-10-CM | POA: Diagnosis not present

## 2023-09-06 DIAGNOSIS — Z5111 Encounter for antineoplastic chemotherapy: Secondary | ICD-10-CM | POA: Diagnosis not present

## 2023-09-06 DIAGNOSIS — R110 Nausea: Secondary | ICD-10-CM | POA: Diagnosis not present

## 2023-09-06 DIAGNOSIS — C679 Malignant neoplasm of bladder, unspecified: Secondary | ICD-10-CM | POA: Diagnosis not present

## 2023-09-06 DIAGNOSIS — Z510 Encounter for antineoplastic radiation therapy: Secondary | ICD-10-CM | POA: Diagnosis not present

## 2023-09-06 LAB — RAD ONC ARIA SESSION SUMMARY
Course Elapsed Days: 11
Plan Fractions Treated to Date: 9
Plan Prescribed Dose Per Fraction: 2 Gy
Plan Total Fractions Prescribed: 11
Plan Total Prescribed Dose: 22 Gy
Reference Point Dosage Given to Date: 18 Gy
Reference Point Session Dosage Given: 2 Gy
Session Number: 9

## 2023-09-09 ENCOUNTER — Ambulatory Visit
Admission: RE | Admit: 2023-09-09 | Discharge: 2023-09-09 | Disposition: A | Discharge status: Home / Self Care | Source: Ambulatory Visit | Attending: Radiation Oncology

## 2023-09-09 ENCOUNTER — Other Ambulatory Visit: Payer: Self-pay

## 2023-09-09 ENCOUNTER — Ambulatory Visit

## 2023-09-09 ENCOUNTER — Other Ambulatory Visit

## 2023-09-09 ENCOUNTER — Ambulatory Visit: Admitting: Oncology

## 2023-09-09 DIAGNOSIS — Z5111 Encounter for antineoplastic chemotherapy: Secondary | ICD-10-CM | POA: Diagnosis not present

## 2023-09-09 LAB — RAD ONC ARIA SESSION SUMMARY
Course Elapsed Days: 14
Plan Fractions Treated to Date: 10
Plan Prescribed Dose Per Fraction: 2 Gy
Plan Total Fractions Prescribed: 11
Plan Total Prescribed Dose: 22 Gy
Reference Point Dosage Given to Date: 20 Gy
Reference Point Session Dosage Given: 2 Gy
Session Number: 10

## 2023-09-10 ENCOUNTER — Ambulatory Visit
Admission: RE | Admit: 2023-09-10 | Discharge: 2023-09-10 | Disposition: A | Discharge status: Home / Self Care | Source: Ambulatory Visit | Attending: Radiation Oncology | Admitting: Radiation Oncology

## 2023-09-10 ENCOUNTER — Other Ambulatory Visit: Payer: Self-pay

## 2023-09-10 DIAGNOSIS — N401 Enlarged prostate with lower urinary tract symptoms: Secondary | ICD-10-CM | POA: Diagnosis not present

## 2023-09-10 DIAGNOSIS — R300 Dysuria: Secondary | ICD-10-CM | POA: Diagnosis not present

## 2023-09-10 DIAGNOSIS — Z510 Encounter for antineoplastic radiation therapy: Secondary | ICD-10-CM | POA: Diagnosis not present

## 2023-09-10 DIAGNOSIS — Z5111 Encounter for antineoplastic chemotherapy: Secondary | ICD-10-CM | POA: Diagnosis not present

## 2023-09-10 DIAGNOSIS — R110 Nausea: Secondary | ICD-10-CM | POA: Diagnosis not present

## 2023-09-10 DIAGNOSIS — C679 Malignant neoplasm of bladder, unspecified: Secondary | ICD-10-CM | POA: Diagnosis not present

## 2023-09-10 DIAGNOSIS — C672 Malignant neoplasm of lateral wall of bladder: Secondary | ICD-10-CM | POA: Diagnosis not present

## 2023-09-10 DIAGNOSIS — Z23 Encounter for immunization: Secondary | ICD-10-CM | POA: Diagnosis not present

## 2023-09-10 LAB — RAD ONC ARIA SESSION SUMMARY
Course Elapsed Days: 15
Plan Fractions Treated to Date: 11
Plan Prescribed Dose Per Fraction: 2 Gy
Plan Total Fractions Prescribed: 11
Plan Total Prescribed Dose: 22 Gy
Reference Point Dosage Given to Date: 22 Gy
Reference Point Session Dosage Given: 2 Gy
Session Number: 11

## 2023-09-11 ENCOUNTER — Ambulatory Visit

## 2023-09-11 ENCOUNTER — Other Ambulatory Visit: Payer: Self-pay

## 2023-09-11 ENCOUNTER — Ambulatory Visit
Admission: RE | Admit: 2023-09-11 | Discharge: 2023-09-11 | Disposition: A | Discharge status: Home / Self Care | Source: Ambulatory Visit | Attending: Radiation Oncology | Admitting: Radiation Oncology

## 2023-09-11 DIAGNOSIS — C679 Malignant neoplasm of bladder, unspecified: Secondary | ICD-10-CM | POA: Diagnosis not present

## 2023-09-11 DIAGNOSIS — Z5111 Encounter for antineoplastic chemotherapy: Secondary | ICD-10-CM | POA: Diagnosis not present

## 2023-09-11 LAB — RAD ONC ARIA SESSION SUMMARY
Course Elapsed Days: 16
Plan Fractions Treated to Date: 1
Plan Prescribed Dose Per Fraction: 1.8 Gy
Plan Total Fractions Prescribed: 25
Plan Total Prescribed Dose: 45 Gy
Reference Point Dosage Given to Date: 1.8 Gy
Reference Point Session Dosage Given: 1.8 Gy
Session Number: 12

## 2023-09-12 ENCOUNTER — Ambulatory Visit
Admission: RE | Admit: 2023-09-12 | Discharge: 2023-09-12 | Disposition: A | Discharge status: Home / Self Care | Source: Ambulatory Visit | Attending: Radiation Oncology | Admitting: Radiation Oncology

## 2023-09-12 ENCOUNTER — Other Ambulatory Visit: Payer: Self-pay

## 2023-09-12 DIAGNOSIS — Z23 Encounter for immunization: Secondary | ICD-10-CM | POA: Diagnosis not present

## 2023-09-12 DIAGNOSIS — Z510 Encounter for antineoplastic radiation therapy: Secondary | ICD-10-CM | POA: Diagnosis not present

## 2023-09-12 DIAGNOSIS — N1330 Unspecified hydronephrosis: Secondary | ICD-10-CM | POA: Diagnosis not present

## 2023-09-12 DIAGNOSIS — Z5111 Encounter for antineoplastic chemotherapy: Secondary | ICD-10-CM | POA: Diagnosis not present

## 2023-09-12 DIAGNOSIS — C679 Malignant neoplasm of bladder, unspecified: Secondary | ICD-10-CM | POA: Diagnosis not present

## 2023-09-12 LAB — RAD ONC ARIA SESSION SUMMARY
Course Elapsed Days: 17
Plan Fractions Treated to Date: 2
Plan Prescribed Dose Per Fraction: 1.8 Gy
Plan Total Fractions Prescribed: 25
Plan Total Prescribed Dose: 45 Gy
Reference Point Dosage Given to Date: 3.6 Gy
Reference Point Session Dosage Given: 1.8 Gy
Session Number: 13

## 2023-09-13 ENCOUNTER — Ambulatory Visit
Admission: RE | Admit: 2023-09-13 | Discharge: 2023-09-13 | Disposition: A | Discharge status: Home / Self Care | Source: Ambulatory Visit | Attending: Radiation Oncology | Admitting: Radiation Oncology

## 2023-09-13 ENCOUNTER — Encounter: Payer: Self-pay | Admitting: Oncology

## 2023-09-13 ENCOUNTER — Encounter

## 2023-09-13 ENCOUNTER — Other Ambulatory Visit: Payer: Self-pay

## 2023-09-13 DIAGNOSIS — Z5111 Encounter for antineoplastic chemotherapy: Secondary | ICD-10-CM | POA: Diagnosis not present

## 2023-09-13 DIAGNOSIS — C679 Malignant neoplasm of bladder, unspecified: Secondary | ICD-10-CM | POA: Diagnosis not present

## 2023-09-13 DIAGNOSIS — Z510 Encounter for antineoplastic radiation therapy: Secondary | ICD-10-CM | POA: Diagnosis not present

## 2023-09-13 DIAGNOSIS — R110 Nausea: Secondary | ICD-10-CM | POA: Diagnosis not present

## 2023-09-13 DIAGNOSIS — Z23 Encounter for immunization: Secondary | ICD-10-CM | POA: Diagnosis not present

## 2023-09-13 LAB — RAD ONC ARIA SESSION SUMMARY
Course Elapsed Days: 18
Plan Fractions Treated to Date: 3
Plan Prescribed Dose Per Fraction: 1.8 Gy
Plan Total Fractions Prescribed: 25
Plan Total Prescribed Dose: 45 Gy
Reference Point Dosage Given to Date: 5.4 Gy
Reference Point Session Dosage Given: 1.8 Gy
Session Number: 14

## 2023-09-15 NOTE — Progress Notes (Signed)
 [facility] [address] [place],  [place]  [address] [phone]  Clinic Day:  09/16/2023  Referring physician: Ezzard Valaria LABOR, MD  HISTORY OF PRESENT ILLNESS:  The patient is a 83 y.o. 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  He is currently receiving definitive chemoradiation, with the chemotherapy component of his disease consisting of infusional 5-fluorouracil /mitomycin .  He comes in today to be evaluated before heading into his second and final cycle of chemotherapy.  The patient claims to have tolerated his first cycle of infusional 5-fluorouracil /mitomycin  fairly well.  However, he does have nausea, which has been reliant on both ondansetron  and Compazine .  He request additional nausea medicine to help him due to his last cycle of treatment.  With respect to his symptoms, this gentleman does have polyuria.  Although he does have a nephrostomy tube, he still urinates fairly frequently.  He denies noticing any hematuria since his chemoradiation commenced.  PHYSICAL EXAM:  Blood pressure 126/67, pulse 67, temperature 98 F (36.7 C), temperature source Oral, resp. rate 16, height 5' 10 (1.778 m), weight 177 lb 4.8 oz (80.4 kg), SpO2 97%. Wt Readings from Last 3 Encounters:  09/16/23 177 lb 4.8 oz (80.4 kg)  09/04/23 174 lb 3.2 oz (79 kg)  08/20/23 176 lb (79.8 kg)   Body mass index is 25.44 kg/m. Performance status (ECOG): 1 Physical Exam Constitutional:      Appearance: Normal appearance. He is not ill-appearing.  HENT:     Mouth/Throat:     Mouth: Mucous membranes are moist.     Pharynx: Oropharynx is clear. No oropharyngeal exudate or posterior oropharyngeal erythema.  Cardiovascular:     Rate and Rhythm: Normal rate and regular rhythm.     Heart sounds: No murmur heard.    No friction rub. No gallop.  Pulmonary:     Effort: Pulmonary effort is normal. No respiratory distress.     Breath sounds: Normal  breath sounds. No wheezing, rhonchi or rales.  Abdominal:     General: Bowel sounds are normal. There is no distension.     Palpations: Abdomen is soft. There is no mass.     Tenderness: There is no abdominal tenderness.  Musculoskeletal:        General: No swelling.     Right lower leg: No edema.     Left lower leg: No edema.  Lymphadenopathy:     Cervical: No cervical adenopathy.     Upper Body:     Right upper body: No supraclavicular or axillary adenopathy.     Left upper body: No supraclavicular or axillary adenopathy.     Lower Body: No right inguinal adenopathy. No left inguinal adenopathy.  Skin:    General: Skin is warm.     Coloration: Skin is not jaundiced.     Findings: No ecchymosis, lesion or rash.  Neurological:     General: No focal deficit present.     Mental Status: He is alert and oriented to person, place, and time. Mental status is at baseline.  Psychiatric:        Mood and Affect: Mood normal.        Behavior: Behavior normal.        Thought Content: Thought content normal.    LABS:      Latest Ref Rng & Units 09/16/2023   10:03 AM 08/26/2023    9:36 AM 08/20/2023    1:26 PM  CBC  WBC  4.0 - 10.5 K/uL 6.2  14.0  8.6   Hemoglobin 13.0 - 17.0 g/dL 88.5  87.3  87.7   Hematocrit 39.0 - 52.0 % 34.4  38.7  37.6   Platelets 150 - 400 K/uL 153  189  179       Latest Ref Rng & Units 09/16/2023   10:03 AM 08/26/2023    9:36 AM 08/20/2023    1:26 PM  CMP  Glucose 70 - 99 mg/dL 836  844  806   BUN 8 - 23 mg/dL 21  33  29   Creatinine 0.61 - 1.24 mg/dL 7.60  7.36  7.42   Sodium 135 - 145 mmol/L 143  138  140   Potassium 3.5 - 5.1 mmol/L 3.9  4.1  4.4   Chloride 98 - 111 mmol/L 106  100  105   CO2 22 - 32 mmol/L 25  24  23    Calcium  8.9 - 10.3 mg/dL 8.9  9.6  9.5   Total Protein 6.5 - 8.1 g/dL 7.0  7.6    Total Bilirubin 0.0 - 1.2 mg/dL 0.3  0.5    Alkaline Phos 38 - 126 U/L 87  112    AST 15 - 41 U/L 18  19    ALT 0 - 44 U/L 7  11     ASSESSMENT & PLAN:   Assessment/Plan:  An 83 y.o. 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  He will proceed with his second and final cycle of mitomycin /infusional 5-fluorouracil  today, which is being given in conjunction with daily radiation.  Overall, the patient appears to be doing well.  With respect to his persistent nausea, a prescription for olanzapine  was written for 10 mg nightly x 5 days.  I will also give him Aloxi  to take with this cycle of chemotherapy to help with his delayed nausea.  Overall, he appears to be doing well.  This patient's radiation will finish in early October 2025.  I will see him back in November 2025 for repeat clinical assessment.  CT scans will be done at that time to ascertain his new disease baseline after the completion of his definitive chemoradiation.  The patient understands all the plans discussed today and is in agreement with them.    Calix Heinbaugh DELENA Kerns, MD

## 2023-09-16 ENCOUNTER — Other Ambulatory Visit: Payer: Self-pay | Admitting: Oncology

## 2023-09-16 ENCOUNTER — Inpatient Hospital Stay

## 2023-09-16 ENCOUNTER — Telehealth: Payer: Self-pay | Admitting: Oncology

## 2023-09-16 ENCOUNTER — Other Ambulatory Visit: Payer: Self-pay

## 2023-09-16 ENCOUNTER — Ambulatory Visit

## 2023-09-16 ENCOUNTER — Inpatient Hospital Stay: Admitting: Oncology

## 2023-09-16 ENCOUNTER — Encounter: Payer: Self-pay | Admitting: Oncology

## 2023-09-16 ENCOUNTER — Ambulatory Visit
Admission: RE | Admit: 2023-09-16 | Discharge: 2023-09-16 | Disposition: A | Discharge status: Home / Self Care | Source: Ambulatory Visit | Attending: Radiation Oncology | Admitting: Radiation Oncology

## 2023-09-16 VITALS — BP 126/67 | HR 67 | Temp 98.0°F | Resp 16 | Ht 70.0 in | Wt 177.3 lb

## 2023-09-16 DIAGNOSIS — C676 Malignant neoplasm of ureteric orifice: Secondary | ICD-10-CM

## 2023-09-16 DIAGNOSIS — Z510 Encounter for antineoplastic radiation therapy: Secondary | ICD-10-CM | POA: Diagnosis not present

## 2023-09-16 DIAGNOSIS — C679 Malignant neoplasm of bladder, unspecified: Secondary | ICD-10-CM | POA: Diagnosis not present

## 2023-09-16 DIAGNOSIS — C672 Malignant neoplasm of lateral wall of bladder: Secondary | ICD-10-CM | POA: Diagnosis not present

## 2023-09-16 DIAGNOSIS — N1330 Unspecified hydronephrosis: Secondary | ICD-10-CM | POA: Diagnosis not present

## 2023-09-16 DIAGNOSIS — Z23 Encounter for immunization: Secondary | ICD-10-CM | POA: Diagnosis not present

## 2023-09-16 DIAGNOSIS — Z5111 Encounter for antineoplastic chemotherapy: Secondary | ICD-10-CM | POA: Diagnosis not present

## 2023-09-16 LAB — CBC WITH DIFFERENTIAL (CANCER CENTER ONLY)
Abs Immature Granulocytes: 0.05 K/uL (ref 0.00–0.07)
Basophils Absolute: 0 K/uL (ref 0.0–0.1)
Basophils Relative: 0 %
Eosinophils Absolute: 0 K/uL (ref 0.0–0.5)
Eosinophils Relative: 0 %
HCT: 34.4 % — ABNORMAL LOW (ref 39.0–52.0)
Hemoglobin: 11.4 g/dL — ABNORMAL LOW (ref 13.0–17.0)
Immature Granulocytes: 1 %
Lymphocytes Relative: 12 %
Lymphs Abs: 0.8 K/uL (ref 0.7–4.0)
MCH: 30.6 pg (ref 26.0–34.0)
MCHC: 33.1 g/dL (ref 30.0–36.0)
MCV: 92.5 fL (ref 80.0–100.0)
Monocytes Absolute: 0.5 K/uL (ref 0.1–1.0)
Monocytes Relative: 8 %
Neutro Abs: 4.9 K/uL (ref 1.7–7.7)
Neutrophils Relative %: 79 %
Platelet Count: 153 K/uL (ref 150–400)
RBC: 3.72 MIL/uL — ABNORMAL LOW (ref 4.22–5.81)
RDW: 16 % — ABNORMAL HIGH (ref 11.5–15.5)
WBC Count: 6.2 K/uL (ref 4.0–10.5)
nRBC: 0 % (ref 0.0–0.2)

## 2023-09-16 LAB — CMP (CANCER CENTER ONLY)
ALT: 7 U/L (ref 0–44)
AST: 18 U/L (ref 15–41)
Albumin: 3.8 g/dL (ref 3.5–5.0)
Alkaline Phosphatase: 87 U/L (ref 38–126)
Anion gap: 12 (ref 5–15)
BUN: 21 mg/dL (ref 8–23)
CO2: 25 mmol/L (ref 22–32)
Calcium: 8.9 mg/dL (ref 8.9–10.3)
Chloride: 106 mmol/L (ref 98–111)
Creatinine: 2.39 mg/dL — ABNORMAL HIGH (ref 0.61–1.24)
GFR, Estimated: 26 mL/min — ABNORMAL LOW (ref >60)
Glucose, Bld: 163 mg/dL — ABNORMAL HIGH (ref 70–99)
Potassium: 3.9 mmol/L (ref 3.5–5.1)
Sodium: 143 mmol/L (ref 135–145)
Total Bilirubin: 0.3 mg/dL (ref 0.0–1.2)
Total Protein: 7 g/dL (ref 6.5–8.1)

## 2023-09-16 LAB — RAD ONC ARIA SESSION SUMMARY
Course Elapsed Days: 21
Plan Fractions Treated to Date: 4
Plan Prescribed Dose Per Fraction: 1.8 Gy
Plan Total Fractions Prescribed: 25
Plan Total Prescribed Dose: 45 Gy
Reference Point Dosage Given to Date: 7.2 Gy
Reference Point Session Dosage Given: 1.8 Gy
Session Number: 15

## 2023-09-16 MED ORDER — SODIUM CHLORIDE 0.9 % IV SOLN
500.0000 mg/m2/d | INTRAVENOUS | Status: DC
  Administered 2023-09-16: 4000 mg via INTRAVENOUS
  Filled 2023-09-16: qty 80

## 2023-09-16 MED ORDER — OLANZAPINE 10 MG PO TABS: 10.0000 mg | ORAL_TABLET | Freq: Every day | ORAL | 0 refills | Status: DC

## 2023-09-16 MED ORDER — PALONOSETRON HCL INJECTION 0.25 MG/5ML
0.2500 mg | Freq: Once | INTRAVENOUS | Status: AC
  Administered 2023-09-16: 0.25 mg via INTRAVENOUS
  Filled 2023-09-16: qty 5

## 2023-09-16 NOTE — Progress Notes (Signed)
 Per Dr Ezzard ok to treat despite creatinine of 2.39 today

## 2023-09-16 NOTE — Telephone Encounter (Signed)
 Patient has been scheduled for follow-up visit per 09/16/23 LOS.  Pt given an appt calendar with date and time.

## 2023-09-16 NOTE — Patient Instructions (Signed)
 Fluorouracil Injection What is this medication? FLUOROURACIL (flure oh YOOR a sil) treats some types of cancer. It works by slowing down the growth of cancer cells. This medicine may be used for other purposes; ask your health care provider or pharmacist if you have questions. COMMON BRAND NAME(S): Adrucil What should I tell my care team before I take this medication? They need to know if you have any of these conditions: Blood disorders Dihydropyrimidine dehydrogenase (DPD) deficiency Infection, such as chickenpox, cold sores, herpes Kidney disease Liver disease Poor nutrition Recent or ongoing radiation therapy An unusual or allergic reaction to fluorouracil, other medications, foods, dyes, or preservatives If you or your partner are pregnant or trying to get pregnant Breast-feeding How should I use this medication? This medication is injected into a vein. It is administered by your care team in a hospital or clinic setting. Talk to your care team about the use of this medication in children. Special care may be needed. Overdosage: If you think you have taken too much of this medicine contact a poison control center or emergency room at once. NOTE: This medicine is only for you. Do not share this medicine with others. What if I miss a dose? Keep appointments for follow-up doses. It is important not to miss your dose. Call your care team if you are unable to keep an appointment. What may interact with this medication? Do not take this medication with any of the following: Live virus vaccines This medication may also interact with the following: Medications that treat or prevent blood clots, such as warfarin, enoxaparin, dalteparin This list may not describe all possible interactions. Give your health care provider a list of all the medicines, herbs, non-prescription drugs, or dietary supplements you use. Also tell them if you smoke, drink alcohol, or use illegal drugs. Some items may  interact with your medicine. What should I watch for while using this medication? Your condition will be monitored carefully while you are receiving this medication. This medication may make you feel generally unwell. This is not uncommon as chemotherapy can affect healthy cells as well as cancer cells. Report any side effects. Continue your course of treatment even though you feel ill unless your care team tells you to stop. In some cases, you may be given additional medications to help with side effects. Follow all directions for their use. This medication may increase your risk of getting an infection. Call your care team for advice if you get a fever, chills, sore throat, or other symptoms of a cold or flu. Do not treat yourself. Try to avoid being around people who are sick. This medication may increase your risk to bruise or bleed. Call your care team if you notice any unusual bleeding. Be careful brushing or flossing your teeth or using a toothpick because you may get an infection or bleed more easily. If you have any dental work done, tell your dentist you are receiving this medication. Avoid taking medications that contain aspirin, acetaminophen, ibuprofen, naproxen, or ketoprofen unless instructed by your care team. These medications may hide a fever. Do not treat diarrhea with over the counter products. Contact your care team if you have diarrhea that lasts more than 2 days or if it is severe and watery. This medication can make you more sensitive to the sun. Keep out of the sun. If you cannot avoid being in the sun, wear protective clothing and sunscreen. Do not use sun lamps, tanning beds, or tanning booths. Talk to  your care team if you or your partner wish to become pregnant or think you might be pregnant. This medication can cause serious birth defects if taken during pregnancy and for 3 months after the last dose. A reliable form of contraception is recommended while taking this  medication and for 3 months after the last dose. Talk to your care team about effective forms of contraception. Do not father a child while taking this medication and for 3 months after the last dose. Use a condom while having sex during this time period. Do not breastfeed while taking this medication. This medication may cause infertility. Talk to your care team if you are concerned about your fertility. What side effects may I notice from receiving this medication? Side effects that you should report to your care team as soon as possible: Allergic reactions--skin rash, itching, hives, swelling of the face, lips, tongue, or throat Heart attack--pain or tightness in the chest, shoulders, arms, or jaw, nausea, shortness of breath, cold or clammy skin, feeling faint or lightheaded Heart failure--shortness of breath, swelling of the ankles, feet, or hands, sudden weight gain, unusual weakness or fatigue Heart rhythm changes--fast or irregular heartbeat, dizziness, feeling faint or lightheaded, chest pain, trouble breathing High ammonia level--unusual weakness or fatigue, confusion, loss of appetite, nausea, vomiting, seizures Infection--fever, chills, cough, sore throat, wounds that don't heal, pain or trouble when passing urine, general feeling of discomfort or being unwell Low red blood cell level--unusual weakness or fatigue, dizziness, headache, trouble breathing Pain, tingling, or numbness in the hands or feet, muscle weakness, change in vision, confusion or trouble speaking, loss of balance or coordination, trouble walking, seizures Redness, swelling, and blistering of the skin over hands and feet Severe or prolonged diarrhea Unusual bruising or bleeding Side effects that usually do not require medical attention (report to your care team if they continue or are bothersome): Dry skin Headache Increased tears Nausea Pain, redness, or swelling with sores inside the mouth or throat Sensitivity  to light Vomiting This list may not describe all possible side effects. Call your doctor for medical advice about side effects. You may report side effects to FDA at 1-800-FDA-1088. Where should I keep my medication? This medication is given in a hospital or clinic. It will not be stored at home. NOTE: This sheet is a summary. It may not cover all possible information. If you have questions about this medicine, talk to your doctor, pharmacist, or health care provider.  2024 Elsevier/Gold Standard (2021-04-25 00:00:00)

## 2023-09-17 ENCOUNTER — Inpatient Hospital Stay: Admitting: Dietician

## 2023-09-17 ENCOUNTER — Other Ambulatory Visit: Payer: Self-pay

## 2023-09-17 ENCOUNTER — Ambulatory Visit
Admission: RE | Admit: 2023-09-17 | Discharge: 2023-09-17 | Disposition: A | Discharge status: Home / Self Care | Source: Ambulatory Visit | Attending: Radiation Oncology

## 2023-09-17 ENCOUNTER — Ambulatory Visit
Admission: RE | Admit: 2023-09-17 | Discharge: 2023-09-17 | Disposition: A | Discharge status: Home / Self Care | Source: Ambulatory Visit | Attending: Radiation Oncology | Admitting: Radiation Oncology

## 2023-09-17 ENCOUNTER — Ambulatory Visit

## 2023-09-17 ENCOUNTER — Other Ambulatory Visit: Payer: Self-pay | Admitting: Nurse Practitioner

## 2023-09-17 DIAGNOSIS — Z5111 Encounter for antineoplastic chemotherapy: Secondary | ICD-10-CM | POA: Diagnosis not present

## 2023-09-17 DIAGNOSIS — C679 Malignant neoplasm of bladder, unspecified: Secondary | ICD-10-CM | POA: Diagnosis not present

## 2023-09-17 LAB — RAD ONC ARIA SESSION SUMMARY
Course Elapsed Days: 22
Plan Fractions Treated to Date: 5
Plan Prescribed Dose Per Fraction: 1.8 Gy
Plan Total Fractions Prescribed: 25
Plan Total Prescribed Dose: 45 Gy
Reference Point Dosage Given to Date: 9 Gy
Reference Point Session Dosage Given: 1.8 Gy
Session Number: 16

## 2023-09-18 ENCOUNTER — Encounter

## 2023-09-18 ENCOUNTER — Other Ambulatory Visit: Payer: Self-pay

## 2023-09-18 ENCOUNTER — Ambulatory Visit
Admission: RE | Admit: 2023-09-18 | Discharge: 2023-09-18 | Disposition: A | Discharge status: Home / Self Care | Source: Ambulatory Visit | Attending: Radiation Oncology

## 2023-09-18 ENCOUNTER — Ambulatory Visit

## 2023-09-18 DIAGNOSIS — Z5111 Encounter for antineoplastic chemotherapy: Secondary | ICD-10-CM | POA: Diagnosis not present

## 2023-09-18 DIAGNOSIS — Z510 Encounter for antineoplastic radiation therapy: Secondary | ICD-10-CM | POA: Diagnosis not present

## 2023-09-18 LAB — RAD ONC ARIA SESSION SUMMARY
Course Elapsed Days: 23
Plan Fractions Treated to Date: 6
Plan Prescribed Dose Per Fraction: 1.8 Gy
Plan Total Fractions Prescribed: 25
Plan Total Prescribed Dose: 45 Gy
Reference Point Dosage Given to Date: 10.8 Gy
Reference Point Session Dosage Given: 1.8 Gy
Session Number: 17

## 2023-09-19 ENCOUNTER — Inpatient Hospital Stay: Admitting: Dietician

## 2023-09-19 ENCOUNTER — Other Ambulatory Visit: Payer: Self-pay

## 2023-09-19 ENCOUNTER — Ambulatory Visit
Admission: RE | Admit: 2023-09-19 | Discharge: 2023-09-19 | Disposition: A | Discharge status: Home / Self Care | Source: Ambulatory Visit | Attending: Radiation Oncology

## 2023-09-19 ENCOUNTER — Ambulatory Visit

## 2023-09-19 DIAGNOSIS — N1330 Unspecified hydronephrosis: Secondary | ICD-10-CM | POA: Diagnosis not present

## 2023-09-19 DIAGNOSIS — Z5111 Encounter for antineoplastic chemotherapy: Secondary | ICD-10-CM | POA: Diagnosis not present

## 2023-09-19 LAB — RAD ONC ARIA SESSION SUMMARY
Course Elapsed Days: 24
Plan Fractions Treated to Date: 7
Plan Prescribed Dose Per Fraction: 1.8 Gy
Plan Total Fractions Prescribed: 25
Plan Total Prescribed Dose: 45 Gy
Reference Point Dosage Given to Date: 12.6 Gy
Reference Point Session Dosage Given: 1.8 Gy
Session Number: 18

## 2023-09-19 NOTE — Progress Notes (Signed)
 Nutrition Follow Up:  Reached out to patient at home telephone#.  He reports was having more nausea and anti emetics switched this week which has helped to reduce his nausea but he did relay that it made him  a bit more fatigued.  He relayed little change in PO intake trying to eat a bit more to stop weight loss.  He says he's drinking plenty of fluids and urinating often with pale colored urine.  Chemo completed this week.  Bowels are regular with use of Miralax . No nutritional concerns at this time.   Medications: Olanzapine  added for nausea  Labs: 09/1523 BUN WNL Creat 2.39,  Hgb 11.4   Anthropometrics:  weight increased past 2 weeks  Height: 70" Weight: 09/16/23  177.4# 09/03/23  174.1# UBW: 195-200# BMI: 26.03  Estimated Energy Needs  Kcals: 2600-3000 Protein: 68-85 g Fluid: 3 L   NUTRITION DIAGNOSIS: Food and Nutrition Related Knowledge Deficit related to cancer and associated treatments as evidenced by no prior need for nutrition related information.   INTERVENTION:   Reviewed strategies for anemia.  Encouraged continued nutrient dense foods when possible to maintain weight/strength and QOL.    Encouraged adequate hydration  MONITORING, EVALUATION, GOAL: weight trends, nutrition impact symptoms, PO intake, labs  Goal is weight maintenance.  Next Visit: In person 2 weeks after radiation PUT  Micheline Craven, RDN, LDN Registered Dietitian, [facility] Part Time Remote (Usual office hours: Tuesday-Thursday) Mobile: [phone]

## 2023-09-20 ENCOUNTER — Inpatient Hospital Stay

## 2023-09-20 ENCOUNTER — Ambulatory Visit

## 2023-09-20 ENCOUNTER — Other Ambulatory Visit: Payer: Self-pay | Admitting: Oncology

## 2023-09-20 ENCOUNTER — Other Ambulatory Visit: Payer: Self-pay

## 2023-09-20 ENCOUNTER — Ambulatory Visit
Admission: RE | Admit: 2023-09-20 | Discharge: 2023-09-20 | Disposition: A | Discharge status: Home / Self Care | Source: Ambulatory Visit | Attending: Radiation Oncology | Admitting: Radiation Oncology

## 2023-09-20 VITALS — BP 121/58 | HR 77 | Temp 98.1°F | Resp 18 | Ht 70.0 in

## 2023-09-20 DIAGNOSIS — N1330 Unspecified hydronephrosis: Secondary | ICD-10-CM | POA: Diagnosis not present

## 2023-09-20 DIAGNOSIS — C679 Malignant neoplasm of bladder, unspecified: Secondary | ICD-10-CM | POA: Diagnosis not present

## 2023-09-20 DIAGNOSIS — C676 Malignant neoplasm of ureteric orifice: Secondary | ICD-10-CM

## 2023-09-20 DIAGNOSIS — Z510 Encounter for antineoplastic radiation therapy: Secondary | ICD-10-CM | POA: Diagnosis not present

## 2023-09-20 DIAGNOSIS — R110 Nausea: Secondary | ICD-10-CM | POA: Diagnosis not present

## 2023-09-20 DIAGNOSIS — C672 Malignant neoplasm of lateral wall of bladder: Secondary | ICD-10-CM

## 2023-09-20 DIAGNOSIS — Z23 Encounter for immunization: Secondary | ICD-10-CM | POA: Diagnosis not present

## 2023-09-20 DIAGNOSIS — Z5111 Encounter for antineoplastic chemotherapy: Secondary | ICD-10-CM | POA: Diagnosis not present

## 2023-09-20 DIAGNOSIS — R112 Nausea with vomiting, unspecified: Secondary | ICD-10-CM

## 2023-09-20 DIAGNOSIS — M545 Low back pain, unspecified: Secondary | ICD-10-CM

## 2023-09-20 LAB — RAD ONC ARIA SESSION SUMMARY
Course Elapsed Days: 25
Plan Fractions Treated to Date: 8
Plan Prescribed Dose Per Fraction: 1.8 Gy
Plan Total Fractions Prescribed: 25
Plan Total Prescribed Dose: 45 Gy
Reference Point Dosage Given to Date: 14.4 Gy
Reference Point Session Dosage Given: 1.8 Gy
Session Number: 19

## 2023-09-20 MED ORDER — OXYCODONE HCL 10 MG PO TABS: ORAL_TABLET | ORAL | 0 refills | Status: AC

## 2023-09-20 NOTE — Patient Instructions (Signed)
 The chemotherapy medication bag should finish at 46 hours, 96 hours, or 7 days. For example, if your pump is scheduled for 46 hours and it was put on at 4:00 p.m., it should finish at 2:00 p.m. the day it is scheduled to come off regardless of your appointment time.     Estimated time to finish at 0945.   If the display on your pump reads Low Volume and it is beeping, take the batteries out of the pump and come to the cancer center for it to be taken off.   If the pump alarms go off prior to the pump reading Low Volume then call [phone] and someone can assist you.  If the plunger comes out and the chemotherapy medication is leaking out, please use your home chemo spill kit to clean up the spill. Do NOT use paper towels or other household products.  If you have problems or questions regarding your pump, please call either [phone] (24 hours a day) or the cancer center Monday-Friday 8:00 a.m.- 4:30 p.m. at the clinic number and we will assist you. If you are unable to get assistance, then go to the nearest Emergency Department and ask the staff to contact the IV team for assistance.

## 2023-09-23 ENCOUNTER — Other Ambulatory Visit: Payer: Self-pay

## 2023-09-23 ENCOUNTER — Ambulatory Visit
Admission: RE | Admit: 2023-09-23 | Discharge: 2023-09-23 | Disposition: A | Discharge status: Home / Self Care | Source: Ambulatory Visit | Attending: Radiation Oncology

## 2023-09-23 ENCOUNTER — Ambulatory Visit

## 2023-09-23 DIAGNOSIS — C679 Malignant neoplasm of bladder, unspecified: Secondary | ICD-10-CM | POA: Diagnosis not present

## 2023-09-23 DIAGNOSIS — Z5111 Encounter for antineoplastic chemotherapy: Secondary | ICD-10-CM | POA: Diagnosis not present

## 2023-09-23 LAB — RAD ONC ARIA SESSION SUMMARY
Course Elapsed Days: 28
Plan Fractions Treated to Date: 9
Plan Prescribed Dose Per Fraction: 1.8 Gy
Plan Total Fractions Prescribed: 25
Plan Total Prescribed Dose: 45 Gy
Reference Point Dosage Given to Date: 16.2 Gy
Reference Point Session Dosage Given: 1.8 Gy
Session Number: 20

## 2023-09-24 ENCOUNTER — Other Ambulatory Visit: Payer: Self-pay

## 2023-09-24 ENCOUNTER — Ambulatory Visit

## 2023-09-24 ENCOUNTER — Ambulatory Visit
Admission: RE | Admit: 2023-09-24 | Discharge: 2023-09-24 | Disposition: A | Discharge status: Home / Self Care | Source: Ambulatory Visit | Attending: Radiation Oncology

## 2023-09-24 ENCOUNTER — Ambulatory Visit
Admission: RE | Admit: 2023-09-24 | Discharge: 2023-09-24 | Disposition: A | Discharge status: Home / Self Care | Source: Ambulatory Visit | Attending: Radiation Oncology | Admitting: Radiation Oncology

## 2023-09-24 VITALS — BP 140/68 | HR 55 | Temp 98.1°F | Resp 18 | Ht 70.0 in

## 2023-09-24 DIAGNOSIS — Z5111 Encounter for antineoplastic chemotherapy: Secondary | ICD-10-CM | POA: Diagnosis not present

## 2023-09-24 DIAGNOSIS — Z23 Encounter for immunization: Secondary | ICD-10-CM

## 2023-09-24 LAB — RAD ONC ARIA SESSION SUMMARY
Course Elapsed Days: 29
Plan Fractions Treated to Date: 10
Plan Prescribed Dose Per Fraction: 1.8 Gy
Plan Total Fractions Prescribed: 25
Plan Total Prescribed Dose: 45 Gy
Reference Point Dosage Given to Date: 18 Gy
Reference Point Session Dosage Given: 1.8 Gy
Session Number: 21

## 2023-09-24 MED ORDER — INFLUENZA VAC SPLIT HIGH-DOSE 0.5 ML IM SUSY
0.5000 mL | PREFILLED_SYRINGE | Freq: Once | INTRAMUSCULAR | Status: AC
  Administered 2023-09-24: 0.5 mL via INTRAMUSCULAR
  Filled 2023-09-24: qty 0.5

## 2023-09-24 NOTE — Patient Instructions (Signed)
Influenza (Flu) Vaccine (Inactivated or Recombinant): What You Need to Know Many vaccine information statements are available in Spanish and other languages. See PromoAge.com.br. 1. Why get vaccinated? Influenza vaccine can prevent influenza (flu). Flu is a contagious disease that spreads around the [place] every year, usually between October and May. Anyone can get the flu, but it is more dangerous for some people. Infants and young children, people 58 years and older, pregnant people, and people with certain health conditions or a weakened immune system are at greatest risk of flu complications. Pneumonia, bronchitis, sinus infections, and ear infections are examples of flu-related complications. If you have a medical condition, such as heart disease, cancer, or diabetes, flu can make it worse. Flu can cause fever and chills, sore throat, muscle aches, fatigue, cough, headache, and runny or stuffy nose. Some people may have vomiting and diarrhea, though this is more common in children than adults. In an average year, thousands of people in the [place] die from flu, and many more are hospitalized. Flu vaccine prevents millions of illnesses and flu-related visits to the doctor each year. 2. Influenza vaccines CDC recommends everyone 6 months and older get vaccinated every flu season. Children 6 months through 51 years of age may need 2 doses during a single flu season. Everyone else needs only 1 dose each flu season. It takes about 2 weeks for protection to develop after vaccination. There are many flu viruses, and they are always changing. Each year a new flu vaccine is made to protect against the influenza viruses believed to be likely to cause disease in the upcoming flu season. Even when the vaccine doesn't exactly match these viruses, it may still provide some protection. Influenza vaccine does not cause flu. Influenza vaccine may be given at the same time as other vaccines. 3.  Talk with your health care provider Tell your vaccination provider if the person getting the vaccine: Has had an allergic reaction after a previous dose of influenza vaccine, or has any severe, life-threatening allergies Has ever had Guillain-Barr Syndrome (also called "GBS") In some cases, your health care provider may decide to postpone influenza vaccination until a future visit. Influenza vaccine can be administered at any time during pregnancy. People who are or will be pregnant during influenza season should receive inactivated influenza vaccine. People with minor illnesses, such as a cold, may be vaccinated. People who are moderately or severely ill should usually wait until they recover before getting influenza vaccine. Your health care provider can give you more information. 4. Risks of a vaccine reaction Soreness, redness, and swelling where the shot is given, fever, muscle aches, and headache can happen after influenza vaccination. There may be a very small increased risk of Guillain-Barr Syndrome (GBS) after inactivated influenza vaccine (the flu shot). Young children who get the flu shot along with pneumococcal vaccine (PCV13) and/or DTaP vaccine at the same time might be slightly more likely to have a seizure caused by fever. Tell your health care provider if a child who is getting flu vaccine has ever had a seizure. People sometimes faint after medical procedures, including vaccination. Tell your provider if you feel dizzy or have vision changes or ringing in the ears. As with any medicine, there is a very remote chance of a vaccine causing a severe allergic reaction, other serious injury, or death. 5. What if there is a serious problem? An allergic reaction could occur after the vaccinated person leaves the clinic. If you see signs of  a severe allergic reaction (hives, swelling of the face and throat, difficulty breathing, a fast heartbeat, dizziness, or weakness), call 9-1-1 and get  the person to the nearest hospital. For other signs that concern you, call your health care provider. Adverse reactions should be reported to the Vaccine Adverse Event Reporting System (VAERS). Your health care provider will usually file this report, or you can do it yourself. Visit the VAERS website at www.vaers.LAgents.no or call (774)365-3656. VAERS is only for reporting reactions, and VAERS staff members do not give medical advice. 6. The National Vaccine Injury Compensation Program The [employer] Vaccine Injury Compensation Program (VICP) is a federal program that was created to compensate people who may have been injured by certain vaccines. Claims regarding alleged injury or death due to vaccination have a time limit for filing, which may be as short as two years. Visit the VICP website at SpiritualWord.at or call 3187036210 to learn about the program and about filing a claim. 7. How can I learn more? Ask your health care provider. Call your local or state health department. Visit the website of the Food and Drug Administration (FDA) for vaccine package inserts and additional information at FinderList.no. Contact the Centers for Disease Control and Prevention (CDC): Call 787-694-5930 (1-800-CDC-INFO) or Visit CDC's website at BiotechRoom.com.cy. Source: CDC Vaccine Information Statement Inactivated Influenza Vaccine (08/07/2019) This same material is available at FootballExhibition.com.br for no charge. This information is not intended to replace advice given to you by your health care provider. Make sure you discuss any questions you have with your health care provider. Document Revised: 04/04/2022 Document Reviewed: 01/08/2022 Elsevier Patient Education  2024 [employer].

## 2023-09-25 ENCOUNTER — Ambulatory Visit

## 2023-09-25 ENCOUNTER — Other Ambulatory Visit: Payer: Self-pay

## 2023-09-25 ENCOUNTER — Ambulatory Visit
Admission: RE | Admit: 2023-09-25 | Discharge: 2023-09-25 | Disposition: A | Discharge status: Home / Self Care | Source: Ambulatory Visit | Attending: Radiation Oncology | Admitting: Radiation Oncology

## 2023-09-25 DIAGNOSIS — Z23 Encounter for immunization: Secondary | ICD-10-CM | POA: Diagnosis not present

## 2023-09-25 DIAGNOSIS — C679 Malignant neoplasm of bladder, unspecified: Secondary | ICD-10-CM | POA: Diagnosis not present

## 2023-09-25 DIAGNOSIS — R110 Nausea: Secondary | ICD-10-CM | POA: Diagnosis not present

## 2023-09-25 DIAGNOSIS — N1330 Unspecified hydronephrosis: Secondary | ICD-10-CM | POA: Diagnosis not present

## 2023-09-25 DIAGNOSIS — Z510 Encounter for antineoplastic radiation therapy: Secondary | ICD-10-CM | POA: Diagnosis not present

## 2023-09-25 DIAGNOSIS — Z5111 Encounter for antineoplastic chemotherapy: Secondary | ICD-10-CM | POA: Diagnosis not present

## 2023-09-25 LAB — RAD ONC ARIA SESSION SUMMARY
Course Elapsed Days: 30
Plan Fractions Treated to Date: 11
Plan Prescribed Dose Per Fraction: 1.8 Gy
Plan Total Fractions Prescribed: 25
Plan Total Prescribed Dose: 45 Gy
Reference Point Dosage Given to Date: 19.8 Gy
Reference Point Session Dosage Given: 1.8 Gy
Session Number: 22

## 2023-09-26 ENCOUNTER — Ambulatory Visit
Admission: RE | Admit: 2023-09-26 | Discharge: 2023-09-26 | Disposition: A | Discharge status: Home / Self Care | Source: Ambulatory Visit | Attending: Radiation Oncology | Admitting: Radiation Oncology

## 2023-09-26 ENCOUNTER — Other Ambulatory Visit: Payer: Self-pay

## 2023-09-26 DIAGNOSIS — E785 Hyperlipidemia, unspecified: Secondary | ICD-10-CM | POA: Diagnosis not present

## 2023-09-26 DIAGNOSIS — I2510 Atherosclerotic heart disease of native coronary artery without angina pectoris: Secondary | ICD-10-CM | POA: Diagnosis not present

## 2023-09-26 DIAGNOSIS — C676 Malignant neoplasm of ureteric orifice: Secondary | ICD-10-CM | POA: Diagnosis not present

## 2023-09-26 DIAGNOSIS — I129 Hypertensive chronic kidney disease with stage 1 through stage 4 chronic kidney disease, or unspecified chronic kidney disease: Secondary | ICD-10-CM | POA: Diagnosis not present

## 2023-09-26 DIAGNOSIS — Z5111 Encounter for antineoplastic chemotherapy: Secondary | ICD-10-CM | POA: Diagnosis not present

## 2023-09-26 DIAGNOSIS — D509 Iron deficiency anemia, unspecified: Secondary | ICD-10-CM | POA: Diagnosis not present

## 2023-09-26 DIAGNOSIS — E1169 Type 2 diabetes mellitus with other specified complication: Secondary | ICD-10-CM | POA: Diagnosis not present

## 2023-09-26 DIAGNOSIS — N184 Chronic kidney disease, stage 4 (severe): Secondary | ICD-10-CM | POA: Diagnosis not present

## 2023-09-26 DIAGNOSIS — M5416 Radiculopathy, lumbar region: Secondary | ICD-10-CM | POA: Diagnosis not present

## 2023-09-26 LAB — RAD ONC ARIA SESSION SUMMARY
Course Elapsed Days: 31
Plan Fractions Treated to Date: 12
Plan Prescribed Dose Per Fraction: 1.8 Gy
Plan Total Fractions Prescribed: 25
Plan Total Prescribed Dose: 45 Gy
Reference Point Dosage Given to Date: 21.6 Gy
Reference Point Session Dosage Given: 1.8 Gy
Session Number: 23

## 2023-09-26 LAB — MICROALBUMIN/CREATININE RATIO, UR
Albumin, Urine POC: 94.8
Creatinine, POC: 152 mg/dL
Microalb Creat Ratio: 62

## 2023-09-27 ENCOUNTER — Telehealth: Payer: Self-pay

## 2023-09-27 ENCOUNTER — Other Ambulatory Visit: Payer: Self-pay

## 2023-09-27 ENCOUNTER — Other Ambulatory Visit: Payer: Self-pay | Admitting: Hematology and Oncology

## 2023-09-27 ENCOUNTER — Ambulatory Visit
Admission: RE | Admit: 2023-09-27 | Discharge: 2023-09-27 | Disposition: A | Discharge status: Home / Self Care | Source: Ambulatory Visit | Attending: Radiation Oncology | Admitting: Radiation Oncology

## 2023-09-27 DIAGNOSIS — Z5111 Encounter for antineoplastic chemotherapy: Secondary | ICD-10-CM | POA: Diagnosis not present

## 2023-09-27 DIAGNOSIS — Z91041 Radiographic dye allergy status: Secondary | ICD-10-CM

## 2023-09-27 LAB — RAD ONC ARIA SESSION SUMMARY
Course Elapsed Days: 32
Plan Fractions Treated to Date: 13
Plan Prescribed Dose Per Fraction: 1.8 Gy
Plan Total Fractions Prescribed: 25
Plan Total Prescribed Dose: 45 Gy
Reference Point Dosage Given to Date: 23.4 Gy
Reference Point Session Dosage Given: 1.8 Gy
Session Number: 24

## 2023-09-27 LAB — HEMOGLOBIN A1C: A1c: 6

## 2023-09-27 LAB — COMPREHENSIVE METABOLIC PANEL WITH GFR: EGFR: 29

## 2023-09-27 MED ORDER — PREDNISONE 50 MG PO TABS: ORAL_TABLET | ORAL | 0 refills | Status: DC

## 2023-09-27 MED ORDER — DIPHENHYDRAMINE HCL 50 MG PO TABS: 50.0000 mg | ORAL_TABLET | Freq: Once | ORAL | 0 refills | Status: AC

## 2023-09-27 NOTE — Telephone Encounter (Signed)
 Pt LVM on nurse line stating they saw pt's PCP yesterday, who did labs, She states his Hgb is lower and wonders if he got a unit of blood if it would make him feel better?

## 2023-09-28 ENCOUNTER — Other Ambulatory Visit: Payer: Self-pay

## 2023-09-28 ENCOUNTER — Encounter (HOSPITAL_BASED_OUTPATIENT_CLINIC_OR_DEPARTMENT_OTHER): Payer: Self-pay

## 2023-09-28 ENCOUNTER — Ambulatory Visit (HOSPITAL_BASED_OUTPATIENT_CLINIC_OR_DEPARTMENT_OTHER)
Admission: EM | Admit: 2023-09-28 | Discharge: 2023-09-28 | Disposition: A | Discharge status: Home / Self Care | Attending: Family Medicine | Admitting: Family Medicine

## 2023-09-28 DIAGNOSIS — U071 COVID-19: Secondary | ICD-10-CM | POA: Diagnosis not present

## 2023-09-28 DIAGNOSIS — R051 Acute cough: Secondary | ICD-10-CM

## 2023-09-28 LAB — POC SOFIA SARS ANTIGEN FIA: SARS Coronavirus 2 Ag: POSITIVE — AB

## 2023-09-28 NOTE — Discharge Instructions (Signed)
 You have COVID.  Based on your kidney function we are unable to use the medication to treat this.  This will be dangerous for your kidneys. You can take Tylenol  over-the-counter extra strength for pain and fever. Rest and  hydrate and follow-up with your doctor as needed

## 2023-09-28 NOTE — ED Provider Notes (Signed)
 [facility]    CSN: [id number] Arrival date & time: 09/28/23  9061      History   Chief Complaint No chief complaint on file.   HPI Barney Russomanno Mitzel is a 83 y.o. male.   Patient is an 83 year old male who presents today with  sore throat, body aches and nasal congestion that started yesterday afternoon.  He is currently undergoing radiation for for bladder cancer.  Was seen at the cancer center yesterday.  Started feeling unwell after his appointment.  Pt has taken OTC meds to relieve discomfort.  Low-grade fever here today.      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   23 mm Edwards Inspiris Resilia stented bioprosthetic tissue valve   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   LIMA to LAD, Sequential SVG to OM1-OM2, SVG to PDA, EVH via right thigh   Type 2 diabetes mellitus (HCC)     Patient Active Problem List   Diagnosis Date Noted   Malignant neoplasm of bladder (HCC) 08/07/2023   Lumbar spinal stenosis 12/04/2021   Pre-operative clearance 03/10/2020   Monoclonal gammopathy of undetermined significance 01/29/2020   Postoperative atrial fibrillation (HCC) 10/22/2017   Diabetes mellitus (HCC) 10/22/2017   S/P aortic valve replacement with bioprosthetic valve  10/03/2017   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   Incidental pulmonary nodule, less than or equal to 3mm 10/02/2017    STEMI involving left circumflex coronary artery (HCC) 10/02/2017   Acute inferoposterior myocardial infarction (HCC) 10/02/2017   Essential hypertension    Mixed hyperlipidemia    Coronary artery disease involving native coronary artery of native heart with unstable angina pectoris (HCC)    Severe aortic stenosis     Past Surgical History:  Procedure Laterality Date   AORTIC VALVE REPLACEMENT N/A 10/03/2017   Procedure: AORTIC VALVE REPLACEMENT (AVR);  Surgeon: Dusty Sudie DEL, MD;  Location: [facility] OR;  Service: Open Heart Surgery;  Laterality: N/A;   APPENDECTOMY     BACK SURGERY  x3   CORONARY ARTERY BYPASS GRAFT N/A 10/03/2017   Procedure: CORONARY ARTERY BYPASS GRAFTING (CABG)x three, USING LEFT INTERNAL MAMMARY ARTERY AND RIGHT GREATER SAPHENOUS VEIN HARVESTED ENDOSCOPICALLY;  Surgeon: Dusty Sudie DEL, MD;  Location: [facility] OR;  Service: Open Heart Surgery;  Laterality: N/A;   CORONARY/GRAFT ACUTE MI REVASCULARIZATION N/A 10/02/2017   Procedure: Coronary/Graft Acute MI Revascularization;  Surgeon: Dann Candyce RAMAN, MD;  Location: [facility] INVASIVE CV LAB;  Service: Cardiovascular;  Laterality: N/A;   IR NEPHROSTOMY PLACEMENT RIGHT  08/20/2023   LEFT HEART CATH AND CORONARY ANGIOGRAPHY N/A 10/02/2017   Procedure: LEFT HEART CATH AND CORONARY ANGIOGRAPHY;  Surgeon: Dann Candyce RAMAN, MD;  Location: [facility] INVASIVE CV LAB;  Service: Cardiovascular;  Laterality: N/A;   LUMBAR LAMINECTOMY/DECOMPRESSION MICRODISCECTOMY N/A 12/04/2021   Procedure: L2-3 DECOMPRESSION;  Surgeon:  Burnetta Aures, MD;  Location: [facility] OR;  Service: Orthopedics;  Laterality: N/A;  2.5 hrs 3 C-Bed   RIGHT/LEFT HEART CATH AND CORONARY ANGIOGRAPHY N/A 09/12/2017   Procedure: RIGHT/LEFT HEART CATH AND CORONARY ANGIOGRAPHY;  Surgeon: Verlin Lonni BIRCH, MD;  Location: [facility] INVASIVE CV LAB;  Service: Cardiovascular;  Laterality: N/A;   TONSILLECTOMY         Home Medications    Prior to Admission medications   Medication  Sig Start Date End Date Taking? Authorizing Provider  OLANZapine  (ZYPREXA ) 10 MG tablet TAKE 1 TABLET(10 MG) BY MOUTH AT BEDTIME 09/20/23   Lewis, Dequincy A, MD  amLODipine  (NORVASC ) 10 MG tablet Take 1 tablet (10 mg total) by mouth daily. 09/18/23   Verlin Lonni BIRCH, MD  aspirin  EC 81 MG tablet Take 81 mg by mouth daily. 09/14/19   [provider]  atorvastatin  (LIPITOR) 40 MG tablet Take 40 mg by mouth at bedtime. 04/15/21   [provider]  Cyanocobalamin (B-12) 1000 MCG TABS Take 1,000 mcg by mouth daily.    [provider]  diphenhydrAMINE  (BENADRYL ) 50 MG tablet Take 1 tablet (50 mg total) by mouth once for 1 dose. 1 hour before schedule CT scan (in preparation for contrast administration) 09/27/23 09/27/23  Parsons, Melissa A, NP  Ferrous Sulfate  (IRON) 325 (65 Fe) MG TABS Take by mouth. 1 tablet 3 times a week    [provider]  gabapentin  (NEURONTIN ) 300 MG capsule Take 300 mg by mouth 2 (two) times daily. Patient taking differently: Take 300 mg by mouth 2 (two) times daily. Taking as needed 12/07/21   [provider]  glimepiride (AMARYL) 2 MG tablet Take 2 mg by mouth daily with breakfast.    [provider]  glucose blood (ONETOUCH ULTRA) test strip USE TO CHECK BLOOD SUGAR ONCE A DAY E11.9 02/23/19   [provider]  isosorbide  mononitrate (IMDUR ) 30 MG 24 hr tablet TAKE 1 TABLET(30 MG) BY MOUTH DAILY 06/19/23   Verlin Lonni BIRCH, MD  lidocaine -prilocaine  (EMLA ) cream Apply to affected area once 08/13/23   Lewis, Dequincy A, MD  methocarbamol  (ROBAXIN ) 500 MG tablet Take 500 mg by mouth every 8 (eight) hours as needed. 01/09/22   [provider]  nitroGLYCERIN  (NITROSTAT ) 0.4 MG SL tablet Place 1 tablet (0.4 mg total) under the tongue every 5 (five) minutes as needed for chest pain. 10/22/17   Parthenia Olivia HERO, PA-C  nystatin cream (MYCOSTATIN) Apply 1 Application topically as needed. 06/27/23   [provider]  ondansetron  (ZOFRAN ) 8 MG tablet Take 1 tablet (8 mg total) by mouth every 8 (eight) hours as needed for nausea or vomiting. 08/13/23   Ezzard Valaria LABOR, MD  OPTIFIBER LEAN PO Take by mouth.    [provider]  Oxycodone  HCl 10 MG TABS One tab po q4-6hr prn pain 09/20/23   Lewis, Dequincy A, MD  pantoprazole  (PROTONIX ) 40 MG tablet Take 40 mg by mouth daily. 12/09/19   [provider]  polyethylene glycol powder (GLYCOLAX /MIRALAX ) 17 GM/SCOOP powder Take by mouth once.    [provider]  predniSONE  (DELTASONE ) 50 MG tablet Please take a dose of prednisone  50 mg 13 hours before scan, then 7 hours before scan, and 1 hour before scan in preparation for contrast administration with CT scan. 09/27/23   Harl Setter A, NP  prochlorperazine  (COMPAZINE ) 10 MG tablet Take 1 tablet (10 mg total) by mouth every 6 (six) hours as needed for nausea or vomiting.  08/13/23   Lewis, Dequincy A, MD  tamsulosin (FLOMAX) 0.4 MG CAPS capsule Take 0.4 mg by mouth daily. 06/25/23   [provider]  URIBEL 81.6 MG TABS Take 1 tablet by mouth every 8 (eight) hours as needed. 08/12/23   [provider]  zolpidem  (AMBIEN ) 10 MG tablet Take 10 mg by mouth at bedtime as needed. 01/21/22   [provider]    Family History Family History  Problem Relation Age of Onset   CVA Father    Heart attack Sister    Heart attack Brother        3 brothers with CAD    Social History Social History   Tobacco Use   Smoking status: Former    Current packs/day: 0.00    Types: Cigarettes    Start date: 1962    Quit date: 2002    Years since quitting: 23.7   Smokeless tobacco: Never  Vaping Use   Vaping status: Never Used  Substance Use Topics   Alcohol use: Never   Drug use: Never     Allergies   Iodinated contrast media, Other, Sucralfate, and Penicillins   Review of Systems Review of Systems See HPI  Physical Exam Triage Vital Signs ED Triage  Vitals  Encounter Vitals Group     BP 09/28/23 1001 106/69     Girls Systolic BP Percentile --      Girls Diastolic BP Percentile --      Boys Systolic BP Percentile --      Boys Diastolic BP Percentile --      Pulse Rate 09/28/23 1001 94     Resp 09/28/23 1001 17     Temp 09/28/23 1001 99.6 F (37.6 C)     Temp Source 09/28/23 1001 Oral     SpO2 09/28/23 1001 90 %     Weight --      Height --      Head Circumference --      Peak Flow --      Pain Score 09/28/23 0959 0     Pain Loc --      Pain Education --      Exclude from Growth Chart --    No data found.  Updated Vital Signs BP 106/69 (BP Location: Right Arm)   Pulse 94   Temp 99.6 F (37.6 C) (Oral)   Resp 17   SpO2 90%   Visual Acuity Right Eye Distance:   Left Eye Distance:   Bilateral Distance:    Right Eye Near:   Left Eye Near:    Bilateral Near:     Physical Exam Constitutional:      Appearance: Normal appearance. He is ill-appearing.  Pulmonary:     Effort: Pulmonary effort is normal.  Musculoskeletal:        General: Normal range of motion.  Neurological:     Mental Status: He is alert.  Psychiatric:        Mood and Affect: Mood normal.      UC Treatments / Results  Labs (all labs ordered are listed, but only abnormal results are displayed) Labs Reviewed  POC SOFIA SARS ANTIGEN FIA - Abnormal; Notable for the following components:      Result Value   SARS Coronavirus 2 Ag Positive (*)    All other components within normal limits    EKG   Radiology No results found.  Procedures Procedures (including critical care time)  Medications Ordered in UC Medications -  No data to display  Initial Impression / Assessment and Plan / UC Course  I have reviewed the triage vital signs and the nursing notes.  Pertinent labs & imaging results that were available during my care of the patient were reviewed by me and considered in my medical decision making (see chart for details).      COVID-19-patient with symptoms that started yesterday.  Patient's COVID test is positive.  Unfortunately based on his GFR being less than 30 we cannot prescribe Paxlovid at this time.  Patient made aware.  Recommended over-the-counter extra strength Tylenol  for his symptoms to include body aches and fever. Mucinex for cough and congestion.   Follow-up as needed Final Clinical Impressions(s) / UC Diagnoses   Final diagnoses:  Acute cough  COVID-19     Discharge Instructions      You have COVID.  Based on your kidney function we are unable to use the medication to treat this.  This will be dangerous for your kidneys. You can take Tylenol  over-the-counter extra strength for pain and fever. Rest and  hydrate and follow-up with your doctor as needed    ED Prescriptions   None    PDMP not reviewed this encounter.   Adah Wilbert LABOR, FNP 09/28/23 1104

## 2023-09-28 NOTE — ED Triage Notes (Addendum)
 Pt is here with sore throat, body aches and nasal congestion that started yesterday afternoon after his Cancer appt here yesterday. Pt has taken OTC meds to relieve discomfort.

## 2023-09-30 ENCOUNTER — Ambulatory Visit

## 2023-09-30 ENCOUNTER — Telehealth: Payer: Self-pay

## 2023-09-30 NOTE — Telephone Encounter (Signed)
 Dr. Melvyn Neth aware.

## 2023-09-30 NOTE — Telephone Encounter (Signed)
 Per Dr. Ezzard - call PCP.  Patient spouse acknowledged.

## 2023-10-01 ENCOUNTER — Inpatient Hospital Stay (HOSPITAL_COMMUNITY): Admission: RE | Admit: 2023-10-01 | Source: Ambulatory Visit

## 2023-10-01 ENCOUNTER — Ambulatory Visit

## 2023-10-01 ENCOUNTER — Ambulatory Visit
Admission: RE | Admit: 2023-10-01 | Discharge: 2023-10-01 | Disposition: A | Source: Ambulatory Visit | Attending: Radiation Oncology | Admitting: Radiation Oncology

## 2023-10-01 ENCOUNTER — Inpatient Hospital Stay: Admitting: Dietician

## 2023-10-01 NOTE — Progress Notes (Signed)
 Nutrition Follow Up:  Reached out to patient at home telephone# spouse answered.  Both patient and spouse have Covid.  MD prescribed prednisone  for them.  She reports he is eating and drinking pretty well.    Medications: Olanzapine  added for nausea  Labs: 09/1523 BUN WNL Creat 2.39,  Hgb 11.4   Anthropometrics:  weight increased past 2 weeks  Height: 70" Weight: 09/24/23  178# at radiation PUT 09/16/23  177.4# 09/03/23  174.1# UBW: 195-200# BMI: 26.03  Estimated Energy Needs  Kcals: 2600-3000 Protein: 68-85 g Fluid: 3 L   NUTRITION DIAGNOSIS: Food and Nutrition Related Knowledge Deficit related to cancer and associated treatments as evidenced by no prior need for nutrition related information.   INTERVENTION:   Encouraged continued nutrient dense foods when possible to maintain weight/strength and QOL.    Encouraged adequate hydration  MONITORING, EVALUATION, GOAL: weight trends, nutrition impact symptoms, PO intake, labs  Goal is weight maintenance.  Next Visit: In person 2 weeks after radiation PUT  Micheline Craven, RDN, LDN Registered Dietitian, [facility] Part Time Remote (Usual office hours: Tuesday-Thursday) Mobile: [phone]

## 2023-10-02 ENCOUNTER — Ambulatory Visit

## 2023-10-03 ENCOUNTER — Other Ambulatory Visit: Payer: Self-pay

## 2023-10-03 ENCOUNTER — Ambulatory Visit
Admission: RE | Admit: 2023-10-03 | Discharge: 2023-10-03 | Disposition: A | Discharge status: Home / Self Care | Source: Ambulatory Visit | Attending: Radiation Oncology | Admitting: Radiation Oncology

## 2023-10-03 DIAGNOSIS — C672 Malignant neoplasm of lateral wall of bladder: Secondary | ICD-10-CM | POA: Insufficient documentation

## 2023-10-03 DIAGNOSIS — N1330 Unspecified hydronephrosis: Secondary | ICD-10-CM | POA: Insufficient documentation

## 2023-10-03 DIAGNOSIS — Z510 Encounter for antineoplastic radiation therapy: Secondary | ICD-10-CM | POA: Insufficient documentation

## 2023-10-03 DIAGNOSIS — J3489 Other specified disorders of nose and nasal sinuses: Secondary | ICD-10-CM | POA: Diagnosis not present

## 2023-10-03 DIAGNOSIS — C679 Malignant neoplasm of bladder, unspecified: Secondary | ICD-10-CM | POA: Diagnosis not present

## 2023-10-03 DIAGNOSIS — Z87891 Personal history of nicotine dependence: Secondary | ICD-10-CM | POA: Insufficient documentation

## 2023-10-03 LAB — RAD ONC ARIA SESSION SUMMARY
Course Elapsed Days: 38
Plan Fractions Treated to Date: 14
Plan Prescribed Dose Per Fraction: 1.8 Gy
Plan Total Fractions Prescribed: 25
Plan Total Prescribed Dose: 45 Gy
Reference Point Dosage Given to Date: 25.2 Gy
Reference Point Session Dosage Given: 1.8 Gy
Session Number: 25

## 2023-10-04 ENCOUNTER — Other Ambulatory Visit: Payer: Self-pay

## 2023-10-04 ENCOUNTER — Ambulatory Visit
Admission: RE | Admit: 2023-10-04 | Discharge: 2023-10-04 | Disposition: A | Discharge status: Home / Self Care | Source: Ambulatory Visit | Attending: Radiation Oncology | Admitting: Radiation Oncology

## 2023-10-04 DIAGNOSIS — Z87891 Personal history of nicotine dependence: Secondary | ICD-10-CM | POA: Diagnosis not present

## 2023-10-04 DIAGNOSIS — C672 Malignant neoplasm of lateral wall of bladder: Secondary | ICD-10-CM | POA: Diagnosis not present

## 2023-10-04 DIAGNOSIS — C679 Malignant neoplasm of bladder, unspecified: Secondary | ICD-10-CM | POA: Diagnosis not present

## 2023-10-04 DIAGNOSIS — Z510 Encounter for antineoplastic radiation therapy: Secondary | ICD-10-CM | POA: Diagnosis not present

## 2023-10-04 LAB — RAD ONC ARIA SESSION SUMMARY
Course Elapsed Days: 39
Plan Fractions Treated to Date: 15
Plan Prescribed Dose Per Fraction: 1.8 Gy
Plan Total Fractions Prescribed: 25
Plan Total Prescribed Dose: 45 Gy
Reference Point Dosage Given to Date: 27 Gy
Reference Point Session Dosage Given: 1.8 Gy
Session Number: 26

## 2023-10-07 ENCOUNTER — Ambulatory Visit
Admission: RE | Admit: 2023-10-07 | Discharge: 2023-10-07 | Disposition: A | Source: Ambulatory Visit | Attending: Radiation Oncology

## 2023-10-07 ENCOUNTER — Other Ambulatory Visit: Payer: Self-pay

## 2023-10-07 DIAGNOSIS — Z87891 Personal history of nicotine dependence: Secondary | ICD-10-CM | POA: Diagnosis not present

## 2023-10-07 DIAGNOSIS — C672 Malignant neoplasm of lateral wall of bladder: Secondary | ICD-10-CM | POA: Diagnosis not present

## 2023-10-07 DIAGNOSIS — Z510 Encounter for antineoplastic radiation therapy: Secondary | ICD-10-CM | POA: Diagnosis not present

## 2023-10-07 DIAGNOSIS — C679 Malignant neoplasm of bladder, unspecified: Secondary | ICD-10-CM | POA: Diagnosis not present

## 2023-10-07 LAB — RAD ONC ARIA SESSION SUMMARY
Course Elapsed Days: 42
Plan Fractions Treated to Date: 16
Plan Prescribed Dose Per Fraction: 1.8 Gy
Plan Total Fractions Prescribed: 25
Plan Total Prescribed Dose: 45 Gy
Reference Point Dosage Given to Date: 28.8 Gy
Reference Point Session Dosage Given: 1.8 Gy
Session Number: 27

## 2023-10-08 ENCOUNTER — Ambulatory Visit

## 2023-10-08 ENCOUNTER — Ambulatory Visit
Admission: RE | Admit: 2023-10-08 | Discharge: 2023-10-08 | Disposition: A | Discharge status: Home / Self Care | Source: Ambulatory Visit | Attending: Radiation Oncology | Admitting: Radiation Oncology

## 2023-10-08 ENCOUNTER — Other Ambulatory Visit: Payer: Self-pay

## 2023-10-08 DIAGNOSIS — C679 Malignant neoplasm of bladder, unspecified: Secondary | ICD-10-CM | POA: Diagnosis not present

## 2023-10-08 DIAGNOSIS — Z510 Encounter for antineoplastic radiation therapy: Secondary | ICD-10-CM | POA: Diagnosis not present

## 2023-10-08 DIAGNOSIS — D84821 Immunodeficiency due to drugs: Secondary | ICD-10-CM | POA: Diagnosis not present

## 2023-10-08 DIAGNOSIS — J340 Abscess, furuncle and carbuncle of nose: Secondary | ICD-10-CM | POA: Diagnosis not present

## 2023-10-08 DIAGNOSIS — Z7969 Long term (current) use of other immunomodulators and immunosuppressants: Secondary | ICD-10-CM | POA: Diagnosis not present

## 2023-10-08 DIAGNOSIS — J342 Deviated nasal septum: Secondary | ICD-10-CM | POA: Diagnosis not present

## 2023-10-08 LAB — RAD ONC ARIA SESSION SUMMARY
Course Elapsed Days: 43
Plan Fractions Treated to Date: 17
Plan Prescribed Dose Per Fraction: 1.8 Gy
Plan Total Fractions Prescribed: 25
Plan Total Prescribed Dose: 45 Gy
Reference Point Dosage Given to Date: 30.6 Gy
Reference Point Session Dosage Given: 1.8 Gy
Session Number: 28

## 2023-10-09 ENCOUNTER — Ambulatory Visit
Admission: RE | Admit: 2023-10-09 | Discharge: 2023-10-09 | Disposition: A | Discharge status: Home / Self Care | Source: Ambulatory Visit | Attending: Radiation Oncology | Admitting: Radiation Oncology

## 2023-10-09 ENCOUNTER — Other Ambulatory Visit: Payer: Self-pay

## 2023-10-09 DIAGNOSIS — N1330 Unspecified hydronephrosis: Secondary | ICD-10-CM | POA: Diagnosis not present

## 2023-10-09 DIAGNOSIS — Z510 Encounter for antineoplastic radiation therapy: Secondary | ICD-10-CM | POA: Diagnosis not present

## 2023-10-09 LAB — RAD ONC ARIA SESSION SUMMARY
Course Elapsed Days: 44
Plan Fractions Treated to Date: 18
Plan Prescribed Dose Per Fraction: 1.8 Gy
Plan Total Fractions Prescribed: 25
Plan Total Prescribed Dose: 45 Gy
Reference Point Dosage Given to Date: 32.4 Gy
Reference Point Session Dosage Given: 1.8 Gy
Session Number: 29

## 2023-10-10 ENCOUNTER — Ambulatory Visit
Admission: RE | Admit: 2023-10-10 | Discharge: 2023-10-10 | Disposition: A | Discharge status: Home / Self Care | Source: Ambulatory Visit | Attending: Radiation Oncology | Admitting: Radiation Oncology

## 2023-10-10 ENCOUNTER — Other Ambulatory Visit: Payer: Self-pay

## 2023-10-10 ENCOUNTER — Other Ambulatory Visit: Payer: Self-pay | Admitting: Oncology

## 2023-10-10 DIAGNOSIS — C676 Malignant neoplasm of ureteric orifice: Secondary | ICD-10-CM

## 2023-10-10 DIAGNOSIS — Z510 Encounter for antineoplastic radiation therapy: Secondary | ICD-10-CM | POA: Diagnosis not present

## 2023-10-10 LAB — RAD ONC ARIA SESSION SUMMARY
Course Elapsed Days: 45
Plan Fractions Treated to Date: 19
Plan Prescribed Dose Per Fraction: 1.8 Gy
Plan Total Fractions Prescribed: 25
Plan Total Prescribed Dose: 45 Gy
Reference Point Dosage Given to Date: 34.2 Gy
Reference Point Session Dosage Given: 1.8 Gy
Session Number: 30

## 2023-10-11 ENCOUNTER — Other Ambulatory Visit: Payer: Self-pay

## 2023-10-11 ENCOUNTER — Ambulatory Visit
Admission: RE | Admit: 2023-10-11 | Discharge: 2023-10-11 | Disposition: A | Discharge status: Home / Self Care | Source: Ambulatory Visit | Attending: Radiation Oncology | Admitting: Radiation Oncology

## 2023-10-11 DIAGNOSIS — Z510 Encounter for antineoplastic radiation therapy: Secondary | ICD-10-CM | POA: Diagnosis not present

## 2023-10-11 DIAGNOSIS — N1330 Unspecified hydronephrosis: Secondary | ICD-10-CM | POA: Diagnosis not present

## 2023-10-11 DIAGNOSIS — C679 Malignant neoplasm of bladder, unspecified: Secondary | ICD-10-CM | POA: Diagnosis not present

## 2023-10-11 DIAGNOSIS — Z87891 Personal history of nicotine dependence: Secondary | ICD-10-CM | POA: Diagnosis not present

## 2023-10-11 LAB — RAD ONC ARIA SESSION SUMMARY
Course Elapsed Days: 46
Plan Fractions Treated to Date: 20
Plan Prescribed Dose Per Fraction: 1.8 Gy
Plan Total Fractions Prescribed: 25
Plan Total Prescribed Dose: 45 Gy
Reference Point Dosage Given to Date: 36 Gy
Reference Point Session Dosage Given: 1.8 Gy
Session Number: 31

## 2023-10-14 ENCOUNTER — Other Ambulatory Visit: Payer: Self-pay

## 2023-10-14 ENCOUNTER — Ambulatory Visit
Admission: RE | Admit: 2023-10-14 | Discharge: 2023-10-14 | Disposition: A | Source: Ambulatory Visit | Attending: Radiation Oncology

## 2023-10-14 DIAGNOSIS — Z510 Encounter for antineoplastic radiation therapy: Secondary | ICD-10-CM | POA: Diagnosis not present

## 2023-10-14 DIAGNOSIS — Z87891 Personal history of nicotine dependence: Secondary | ICD-10-CM | POA: Diagnosis not present

## 2023-10-14 DIAGNOSIS — C672 Malignant neoplasm of lateral wall of bladder: Secondary | ICD-10-CM | POA: Diagnosis not present

## 2023-10-14 DIAGNOSIS — C679 Malignant neoplasm of bladder, unspecified: Secondary | ICD-10-CM | POA: Diagnosis not present

## 2023-10-14 LAB — RAD ONC ARIA SESSION SUMMARY
Course Elapsed Days: 49
Plan Fractions Treated to Date: 21
Plan Prescribed Dose Per Fraction: 1.8 Gy
Plan Total Fractions Prescribed: 25
Plan Total Prescribed Dose: 45 Gy
Reference Point Dosage Given to Date: 37.8 Gy
Reference Point Session Dosage Given: 1.8 Gy
Session Number: 32

## 2023-10-15 ENCOUNTER — Ambulatory Visit
Admission: RE | Admit: 2023-10-15 | Discharge: 2023-10-15 | Disposition: A | Discharge status: Home / Self Care | Source: Ambulatory Visit | Attending: Radiation Oncology | Admitting: Radiation Oncology

## 2023-10-15 ENCOUNTER — Other Ambulatory Visit: Payer: Self-pay

## 2023-10-15 ENCOUNTER — Inpatient Hospital Stay: Attending: Radiation Oncology | Admitting: Dietician

## 2023-10-15 ENCOUNTER — Ambulatory Visit

## 2023-10-15 DIAGNOSIS — Z87891 Personal history of nicotine dependence: Secondary | ICD-10-CM | POA: Diagnosis not present

## 2023-10-15 DIAGNOSIS — Z510 Encounter for antineoplastic radiation therapy: Secondary | ICD-10-CM | POA: Diagnosis not present

## 2023-10-15 DIAGNOSIS — C672 Malignant neoplasm of lateral wall of bladder: Secondary | ICD-10-CM | POA: Diagnosis not present

## 2023-10-15 DIAGNOSIS — C679 Malignant neoplasm of bladder, unspecified: Secondary | ICD-10-CM | POA: Diagnosis not present

## 2023-10-15 LAB — RAD ONC ARIA SESSION SUMMARY
Course Elapsed Days: 50
Plan Fractions Treated to Date: 22
Plan Prescribed Dose Per Fraction: 1.8 Gy
Plan Total Fractions Prescribed: 25
Plan Total Prescribed Dose: 45 Gy
Reference Point Dosage Given to Date: 39.6 Gy
Reference Point Session Dosage Given: 1.8 Gy
Session Number: 33

## 2023-10-15 NOTE — Progress Notes (Signed)
 Nutrition Follow Up:  Met with patient and spouse after radiation PUT.  Patient will complete radiation treatments this Friday and will have CT scan next month.  Both patient and spouse had Covid.  Patient reports his Covid recovery was slow and he had a very serious case with increased fatigue and fevers.  He is eating a little better now and slowly regaining strength and appetite.  Still trying to eat 3 meals, no pain or discomfort with eating.   Medications: Olanzapine  added for nausea  Labs: 09/1523 BUN WNL Creat 2.39,  Hgb 11.4   Anthropometrics:  weight loss 15# during Covid infection/recovery  Height: 70" Weight: 10/15/23  163# at radiation PUT 10/08/23  162.4# at radiation PUT 09/24/23  178# at radiation PUT 09/16/23  177.4# 09/03/23  174.1# UBW: 195-200# BMI: 23.4  Estimated Energy Needs  Kcals: 2600-3000 Protein: 68-85 g Fluid: 3 L   NUTRITION DIAGNOSIS: Inadequate PO intake to meet increased nutrient needs with cancer and Covid  INTERVENTION:   Encouraged continued nutrient dense foods when possible to maintain weight/strength and QOL.    Encouraged use of ONS 1-2/day to increase nutrient intake and help weight maintenance. Provided samples of chocolate Boost HP, CIB, Ensure Complete with coupons for Ensure Complete.  Encouraged adequate hydration.  Encouraged patience with regain of weight 2-4#/month.  MONITORING, EVALUATION, GOAL: weight trends, nutrition impact symptoms, PO intake, labs  Goal is weight maintenance.  Next Visit: Remote next month after MD follow up on CT scans.  Micheline Craven, RDN, LDN Registered Dietitian, [facility] Part Time Remote (Usual office hours: Tuesday-Thursday) Mobile: [phone]

## 2023-10-16 ENCOUNTER — Other Ambulatory Visit: Payer: Self-pay

## 2023-10-16 ENCOUNTER — Ambulatory Visit
Admission: RE | Admit: 2023-10-16 | Discharge: 2023-10-16 | Disposition: A | Source: Ambulatory Visit | Attending: Radiation Oncology

## 2023-10-16 ENCOUNTER — Ambulatory Visit

## 2023-10-16 DIAGNOSIS — Z510 Encounter for antineoplastic radiation therapy: Secondary | ICD-10-CM | POA: Diagnosis not present

## 2023-10-16 LAB — RAD ONC ARIA SESSION SUMMARY
Course Elapsed Days: 51
Plan Fractions Treated to Date: 23
Plan Prescribed Dose Per Fraction: 1.8 Gy
Plan Total Fractions Prescribed: 25
Plan Total Prescribed Dose: 45 Gy
Reference Point Dosage Given to Date: 41.4 Gy
Reference Point Session Dosage Given: 1.8 Gy
Session Number: 34

## 2023-10-17 ENCOUNTER — Other Ambulatory Visit: Payer: Self-pay

## 2023-10-17 ENCOUNTER — Ambulatory Visit

## 2023-10-17 ENCOUNTER — Ambulatory Visit
Admission: RE | Admit: 2023-10-17 | Discharge: 2023-10-17 | Disposition: A | Discharge status: Home / Self Care | Source: Ambulatory Visit | Attending: Radiation Oncology | Admitting: Radiation Oncology

## 2023-10-17 DIAGNOSIS — C679 Malignant neoplasm of bladder, unspecified: Secondary | ICD-10-CM | POA: Diagnosis not present

## 2023-10-17 DIAGNOSIS — Z510 Encounter for antineoplastic radiation therapy: Secondary | ICD-10-CM | POA: Diagnosis not present

## 2023-10-17 DIAGNOSIS — N1330 Unspecified hydronephrosis: Secondary | ICD-10-CM | POA: Diagnosis not present

## 2023-10-17 LAB — RAD ONC ARIA SESSION SUMMARY
Course Elapsed Days: 52
Plan Fractions Treated to Date: 24
Plan Prescribed Dose Per Fraction: 1.8 Gy
Plan Total Fractions Prescribed: 25
Plan Total Prescribed Dose: 45 Gy
Reference Point Dosage Given to Date: 43.2 Gy
Reference Point Session Dosage Given: 1.8 Gy
Session Number: 35

## 2023-10-18 ENCOUNTER — Ambulatory Visit

## 2023-10-18 ENCOUNTER — Other Ambulatory Visit: Payer: Self-pay

## 2023-10-18 ENCOUNTER — Ambulatory Visit
Admission: RE | Admit: 2023-10-18 | Discharge: 2023-10-18 | Disposition: A | Source: Ambulatory Visit | Attending: Radiation Oncology

## 2023-10-18 DIAGNOSIS — Z510 Encounter for antineoplastic radiation therapy: Secondary | ICD-10-CM | POA: Diagnosis not present

## 2023-10-18 DIAGNOSIS — J3489 Other specified disorders of nose and nasal sinuses: Secondary | ICD-10-CM | POA: Diagnosis not present

## 2023-10-18 LAB — RAD ONC ARIA SESSION SUMMARY
Course Elapsed Days: 53
Plan Fractions Treated to Date: 25
Plan Prescribed Dose Per Fraction: 1.8 Gy
Plan Total Fractions Prescribed: 25
Plan Total Prescribed Dose: 45 Gy
Reference Point Dosage Given to Date: 45 Gy
Reference Point Session Dosage Given: 1.8 Gy
Session Number: 36

## 2023-10-21 DIAGNOSIS — Z79899 Other long term (current) drug therapy: Secondary | ICD-10-CM | POA: Diagnosis not present

## 2023-10-21 DIAGNOSIS — M47816 Spondylosis without myelopathy or radiculopathy, lumbar region: Secondary | ICD-10-CM | POA: Diagnosis not present

## 2023-10-21 DIAGNOSIS — M5136 Other intervertebral disc degeneration, lumbar region with discogenic back pain only: Secondary | ICD-10-CM | POA: Diagnosis not present

## 2023-10-21 DIAGNOSIS — Z79891 Long term (current) use of opiate analgesic: Secondary | ICD-10-CM | POA: Diagnosis not present

## 2023-10-21 DIAGNOSIS — M7918 Myalgia, other site: Secondary | ICD-10-CM | POA: Diagnosis not present

## 2023-10-21 DIAGNOSIS — Z760 Encounter for issue of repeat prescription: Secondary | ICD-10-CM | POA: Diagnosis not present

## 2023-10-21 NOTE — Radiation Completion Notes (Signed)
 Patient Name: Erik Keith, Erik Keith MRN: [id number] Date of Birth: 1940/10/14 Referring Physician: VICENTA SARAS, M.D. Date of Service: 2023-10-21 Radiation Oncologist: Lynwood Cedar, M.D. [facility]                             RADIATION ONCOLOGY END OF TREATMENT NOTE     Diagnosis: C67.9 Malignant neoplasm of bladder, unspecified Intent: Curative     ==========DELIVERED PLANS==========  First Treatment Date: 2023-08-26 Last Treatment Date: 2023-10-18   Plan Name: Bladder_Bst Site: Bladder Technique: 3D Mode: Photon Dose Per Fraction: 2 Gy Prescribed Dose (Delivered / Prescribed): 22 Gy / 22 Gy Prescribed Fxs (Delivered / Prescribed): 11 / 11   Plan Name: Bladder_Pel Site: Bladder Technique: 3D Mode: Photon Dose Per Fraction: 1.8 Gy Prescribed Dose (Delivered / Prescribed): 45 Gy / 45 Gy Prescribed Fxs (Delivered / Prescribed): 25 / 25     ==========ON TREATMENT VISIT DATES========== 2023-08-27, 2023-09-03, 2023-09-10, 2023-09-17, 2023-09-24, 2023-10-08, 2023-10-15     ==========UPCOMING VISITS========== 11/21/2023 [facility] RAD ONC FOLLOW UP 20 Cedar Lynwood, MD  11/20/2023 [facility] MED ONC NUT 45 Dinger, Montie CROME, RD  11/15/2023 [facility] MED ONC EST PT 15 Lewis, Dequincy A, MD  11/14/2023 [facility]-CT IMAGING CT ABDOMEN PELVIS W CONTRAST [facility]-CT 1  11/14/2023 [facility] MED ONC PORT FLUSH W/LAB [facility] PORT FLUSH LAB        ==========APPENDIX - ON TREATMENT VISIT NOTES==========   See weekly On Treatment Notes in Epic for details in the Media tab (listed as Progress notes on the On Treatment Visit Dates listed above).

## 2023-10-23 DIAGNOSIS — N401 Enlarged prostate with lower urinary tract symptoms: Secondary | ICD-10-CM | POA: Diagnosis not present

## 2023-10-23 DIAGNOSIS — R300 Dysuria: Secondary | ICD-10-CM | POA: Diagnosis not present

## 2023-10-23 DIAGNOSIS — C672 Malignant neoplasm of lateral wall of bladder: Secondary | ICD-10-CM | POA: Diagnosis not present

## 2023-10-29 DIAGNOSIS — E785 Hyperlipidemia, unspecified: Secondary | ICD-10-CM | POA: Diagnosis not present

## 2023-10-29 DIAGNOSIS — I509 Heart failure, unspecified: Secondary | ICD-10-CM | POA: Diagnosis not present

## 2023-10-29 DIAGNOSIS — E1169 Type 2 diabetes mellitus with other specified complication: Secondary | ICD-10-CM | POA: Diagnosis not present

## 2023-10-29 DIAGNOSIS — M545 Low back pain, unspecified: Secondary | ICD-10-CM | POA: Diagnosis not present

## 2023-10-29 DIAGNOSIS — E559 Vitamin D deficiency, unspecified: Secondary | ICD-10-CM | POA: Diagnosis not present

## 2023-10-29 DIAGNOSIS — C679 Malignant neoplasm of bladder, unspecified: Secondary | ICD-10-CM | POA: Diagnosis not present

## 2023-10-29 DIAGNOSIS — R309 Painful micturition, unspecified: Secondary | ICD-10-CM | POA: Diagnosis not present

## 2023-10-29 DIAGNOSIS — R809 Proteinuria, unspecified: Secondary | ICD-10-CM | POA: Diagnosis not present

## 2023-10-29 DIAGNOSIS — I10 Essential (primary) hypertension: Secondary | ICD-10-CM | POA: Diagnosis not present

## 2023-10-29 DIAGNOSIS — N183 Chronic kidney disease, stage 3 unspecified: Secondary | ICD-10-CM | POA: Diagnosis not present

## 2023-10-29 DIAGNOSIS — E211 Secondary hyperparathyroidism, not elsewhere classified: Secondary | ICD-10-CM | POA: Diagnosis not present

## 2023-11-14 ENCOUNTER — Inpatient Hospital Stay: Attending: Radiation Oncology

## 2023-11-14 ENCOUNTER — Inpatient Hospital Stay (HOSPITAL_BASED_OUTPATIENT_CLINIC_OR_DEPARTMENT_OTHER)
Admission: RE | Admit: 2023-11-14 | Discharge: 2023-11-14 | Disposition: A | Source: Ambulatory Visit | Attending: Oncology | Admitting: Oncology

## 2023-11-14 DIAGNOSIS — C679 Malignant neoplasm of bladder, unspecified: Secondary | ICD-10-CM | POA: Insufficient documentation

## 2023-11-14 DIAGNOSIS — Z9221 Personal history of antineoplastic chemotherapy: Secondary | ICD-10-CM | POA: Insufficient documentation

## 2023-11-14 DIAGNOSIS — C676 Malignant neoplasm of ureteric orifice: Secondary | ICD-10-CM

## 2023-11-14 DIAGNOSIS — Z923 Personal history of irradiation: Secondary | ICD-10-CM | POA: Insufficient documentation

## 2023-11-14 DIAGNOSIS — K820 Obstruction of gallbladder: Secondary | ICD-10-CM | POA: Diagnosis not present

## 2023-11-14 LAB — CBC WITH DIFFERENTIAL (CANCER CENTER ONLY)
Abs Immature Granulocytes: 0.02 K/uL (ref 0.00–0.07)
Basophils Absolute: 0 K/uL (ref 0.0–0.1)
Basophils Relative: 0 %
Eosinophils Absolute: 0.1 K/uL (ref 0.0–0.5)
Eosinophils Relative: 1 %
HCT: 40.1 % (ref 39.0–52.0)
Hemoglobin: 13.3 g/dL (ref 13.0–17.0)
Immature Granulocytes: 0 %
Lymphocytes Relative: 15 %
Lymphs Abs: 1.1 K/uL (ref 0.7–4.0)
MCH: 30.9 pg (ref 26.0–34.0)
MCHC: 33.2 g/dL (ref 30.0–36.0)
MCV: 93 fL (ref 80.0–100.0)
Monocytes Absolute: 0.7 K/uL (ref 0.1–1.0)
Monocytes Relative: 9 %
Neutro Abs: 5.6 K/uL (ref 1.7–7.7)
Neutrophils Relative %: 75 %
Platelet Count: 122 K/uL — ABNORMAL LOW (ref 150–400)
RBC: 4.31 MIL/uL (ref 4.22–5.81)
RDW: 15.7 % — ABNORMAL HIGH (ref 11.5–15.5)
WBC Count: 7.6 K/uL (ref 4.0–10.5)
nRBC: 0 % (ref 0.0–0.2)

## 2023-11-14 LAB — CMP (CANCER CENTER ONLY)
ALT: 7 U/L (ref 0–44)
AST: 18 U/L (ref 15–41)
Albumin: 4.2 g/dL (ref 3.5–5.0)
Alkaline Phosphatase: 83 U/L (ref 38–126)
Anion gap: 11 (ref 5–15)
BUN: 31 mg/dL — ABNORMAL HIGH (ref 8–23)
CO2: 26 mmol/L (ref 22–32)
Calcium: 10 mg/dL (ref 8.9–10.3)
Chloride: 101 mmol/L (ref 98–111)
Creatinine: 2.46 mg/dL — ABNORMAL HIGH (ref 0.61–1.24)
GFR, Estimated: 25 mL/min — ABNORMAL LOW (ref >60)
Glucose, Bld: 183 mg/dL — ABNORMAL HIGH (ref 70–99)
Potassium: 4.4 mmol/L (ref 3.5–5.1)
Sodium: 139 mmol/L (ref 135–145)
Total Bilirubin: 0.5 mg/dL (ref 0.0–1.2)
Total Protein: 7.5 g/dL (ref 6.5–8.1)

## 2023-11-14 MED ORDER — HEPARIN SOD (PORK) LOCK FLUSH 100 UNIT/ML IV SOLN
500.0000 [IU] | Freq: Once | INTRAVENOUS | Status: AC
  Administered 2023-11-14: 500 [IU] via INTRAVENOUS

## 2023-11-14 NOTE — Progress Notes (Unsigned)
 [facility] [address] [place],  [place]  [address] [phone]  Clinic Day:  09/16/2023  Referring physician: Keren Vicenta BRAVO, MD  HISTORY OF PRESENT ILLNESS:  The patient is a 83 y.o. 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  He is currently receiving definitive chemoradiation, with the chemotherapy component of his disease consisting of infusional 5-fluorouracil /mitomycin .  He comes in today to be evaluated before heading into his second and final cycle of chemotherapy.  The patient claims to have tolerated his first cycle of infusional 5-fluorouracil /mitomycin  fairly well.  However, he does have nausea, which has been reliant on both ondansetron  and Compazine .  He request additional nausea medicine to help him due to his last cycle of treatment.  With respect to his symptoms, this gentleman does have polyuria.  Although he does have a nephrostomy tube, he still urinates fairly frequently.  He denies noticing any hematuria since his chemoradiation commenced.  PHYSICAL EXAM:  There were no vitals taken for this visit. Wt Readings from Last 3 Encounters:  09/16/23 177 lb 4.8 oz (80.4 kg)  09/04/23 174 lb 3.2 oz (79 kg)  08/20/23 176 lb (79.8 kg)   There is no height or weight on file to calculate BMI. Performance status (ECOG): 1 Physical Exam Constitutional:      Appearance: Normal appearance. He is not ill-appearing.  HENT:     Mouth/Throat:     Mouth: Mucous membranes are moist.     Pharynx: Oropharynx is clear. No oropharyngeal exudate or posterior oropharyngeal erythema.  Cardiovascular:     Rate and Rhythm: Normal rate and regular rhythm.     Heart sounds: No murmur heard.    No friction rub. No gallop.  Pulmonary:     Effort: Pulmonary effort is normal. No respiratory distress.     Breath sounds: Normal breath sounds. No wheezing, rhonchi or rales.  Abdominal:     General: Bowel sounds are normal. There is  no distension.     Palpations: Abdomen is soft. There is no mass.     Tenderness: There is no abdominal tenderness.  Musculoskeletal:        General: No swelling.     Right lower leg: No edema.     Left lower leg: No edema.  Lymphadenopathy:     Cervical: No cervical adenopathy.     Upper Body:     Right upper body: No supraclavicular or axillary adenopathy.     Left upper body: No supraclavicular or axillary adenopathy.     Lower Body: No right inguinal adenopathy. No left inguinal adenopathy.  Skin:    General: Skin is warm.     Coloration: Skin is not jaundiced.     Findings: No ecchymosis, lesion or rash.  Neurological:     General: No focal deficit present.     Mental Status: He is alert and oriented to person, place, and time. Mental status is at baseline.  Psychiatric:        Mood and Affect: Mood normal.        Behavior: Behavior normal.        Thought Content: Thought content normal.   SCANS:  CT scans of his abdomen/pelvis revealed the following:  LABS:      Latest Ref Rng & Units 11/14/2023   12:59 PM 09/16/2023   10:03 AM 08/26/2023    9:36 AM  CBC  WBC 4.0 - 10.5 K/uL 7.6  6.2  14.0   Hemoglobin 13.0 - 17.0 g/dL 86.6  88.5  87.3   Hematocrit 39.0 - 52.0 % 40.1  34.4  38.7   Platelets 150 - 400 K/uL 122  153  189       Latest Ref Rng & Units 11/14/2023   12:59 PM 09/16/2023   10:03 AM 08/26/2023    9:36 AM  CMP  Glucose 70 - 99 mg/dL 816  836  844   BUN 8 - 23 mg/dL 31  21  33   Creatinine 0.61 - 1.24 mg/dL 7.53  7.60  7.36   Sodium 135 - 145 mmol/L 139  143  138   Potassium 3.5 - 5.1 mmol/L 4.4  3.9  4.1   Chloride 98 - 111 mmol/L 101  106  100   CO2 22 - 32 mmol/L 26  25  24    Calcium  8.9 - 10.3 mg/dL 89.9  8.9  9.6   Total Protein 6.5 - 8.1 g/dL 7.5  7.0  7.6   Total Bilirubin 0.0 - 1.2 mg/dL 0.5  0.3  0.5   Alkaline Phos 38 - 126 U/L 83  87  112   AST 15 - 41 U/L 18  18  19    ALT 0 - 44 U/L 7  7  11     ASSESSMENT & PLAN:  Assessment/Plan:  An  83 y.o. 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  He will proceed with his second and final cycle of mitomycin /infusional 5-fluorouracil  today, which is being given in conjunction with daily radiation.  Overall, the patient appears to be doing well.  With respect to his persistent nausea, a prescription for olanzapine  was written for 10 mg nightly x 5 days.  I will also give him Aloxi  to take with this cycle of chemotherapy to help with his delayed nausea.  Overall, he appears to be doing well.  This patient's radiation will finish in early October 2025.  I will see him back in November 2025 for repeat clinical assessment.  CT scans will be done at that time to ascertain his new disease baseline after the completion of his definitive chemoradiation.  The patient understands all the plans discussed today and is in agreement with them.    Charleston Vierling DELENA Kerns, MD

## 2023-11-15 ENCOUNTER — Other Ambulatory Visit: Payer: Self-pay | Admitting: Oncology

## 2023-11-15 ENCOUNTER — Inpatient Hospital Stay (HOSPITAL_BASED_OUTPATIENT_CLINIC_OR_DEPARTMENT_OTHER): Admitting: Oncology

## 2023-11-15 ENCOUNTER — Telehealth: Payer: Self-pay | Admitting: Oncology

## 2023-11-15 VITALS — BP 117/79 | HR 88 | Temp 97.8°F | Resp 18 | Ht 70.0 in | Wt 158.3 lb

## 2023-11-15 DIAGNOSIS — Z9221 Personal history of antineoplastic chemotherapy: Secondary | ICD-10-CM | POA: Diagnosis not present

## 2023-11-15 DIAGNOSIS — C676 Malignant neoplasm of ureteric orifice: Secondary | ICD-10-CM

## 2023-11-15 DIAGNOSIS — C679 Malignant neoplasm of bladder, unspecified: Secondary | ICD-10-CM | POA: Diagnosis not present

## 2023-11-15 DIAGNOSIS — Z923 Personal history of irradiation: Secondary | ICD-10-CM | POA: Diagnosis not present

## 2023-11-15 DIAGNOSIS — C672 Malignant neoplasm of lateral wall of bladder: Secondary | ICD-10-CM | POA: Diagnosis not present

## 2023-11-15 MED ORDER — PROMETHAZINE HCL 25 MG PO TABS: 25.0000 mg | ORAL_TABLET | Freq: Four times a day (QID) | ORAL | 1 refills | Status: DC | PRN

## 2023-11-15 MED ORDER — PREDNISONE 20 MG PO TABS: 20.0000 mg | ORAL_TABLET | Freq: Every day | ORAL | 0 refills | Status: AC

## 2023-11-15 NOTE — Telephone Encounter (Signed)
 Patient has been scheduled for follow-up visit per 11/15/23 LOS.  Pt given an appt calendar with date and time.

## 2023-11-17 ENCOUNTER — Other Ambulatory Visit: Payer: Self-pay

## 2023-11-20 ENCOUNTER — Telehealth: Payer: Self-pay | Admitting: Dietician

## 2023-11-20 ENCOUNTER — Inpatient Hospital Stay: Admitting: Dietician

## 2023-11-20 NOTE — Telephone Encounter (Signed)
Attempted to reach patient for a scheduled remote nutrition follow up. Provided my cell# on voice mail to return call for his follow up nutrition consult.  Gennaro Africa, RDN, LDN Registered Dietitian, [facility] Part Time Remote (Usual office hours: Tuesday-Thursday) Cell: [phone]

## 2023-11-20 NOTE — Progress Notes (Signed)
 Nutrition Follow Up:  Patient returned message for remote follow up.  He was at [facility] to meet with me when I called. Apologized for the, misunderstanding.    Patient states the prednisone  is helping his appetite now.  Eating a bit better.  Stomach pain 5-6 then passes.  He says pain isn't related to eating or drinking.   He denies nausea, bowels regular. Hasn't been drinking any ONS regularly.  He states enjoys the Ensure the best.   Usual intake 3 meals most days.    [place], eggs grits Potato soup, country style steak creamed potatoes.   Medications: Olanzapine  added for nausea   Labs: 11/14/23  BUN 31 Creat 2.46,  Hgb 13.3     Anthropometrics:  weight loss continues with 5# loss past month, 17.7# (10%) past 3 months which is clinically significant for timeframe.   Height: 70" Weight: 11/15/23  158.3# 10/15/23  163# at radiation PUT 10/08/23  162.4# at radiation PUT 09/24/23  178# at radiation PUT 09/16/23  177.4# 09/03/23  174.1# UBW: 195-200# BMI: 23.4   Estimated Energy Needs   Kcals: 2600-3000 Protein: 68-85 g Fluid: 3 L     NUTRITION DIAGNOSIS: Inadequate PO intake to meet increased nutrient needs with cancer.  Continues  INTERVENTION:    Encouraged continued nutrient dense foods when possible to maintain weight/strength and QOL.    Encouraged use of ONS at night to boost intake  with out filling up prior to other meals and help weight maintenance. Encouraged asking for coupons at radiation follow up tomorrow.  Encouraged adequate hydration.  Relayed alternatives to plain water and encouraged high calorie options as able between meals for renal preservation.  Encouraged patience with regain of weight 2-4#/month.   MONITORING, EVALUATION, GOAL: weight trends, nutrition impact symptoms, PO intake, labs  Goal is weight maintenance.   Next Visit: Remote next month  Micheline Craven, RDN, LDN Registered Dietitian, [facility] Part Time Remote (Usual  office hours: Tuesday-Thursday) Mobile: [phone]

## 2023-11-21 ENCOUNTER — Ambulatory Visit
Admission: RE | Admit: 2023-11-21 | Discharge: 2023-11-21 | Disposition: A | Discharge status: Home / Self Care | Source: Ambulatory Visit | Attending: Radiation Oncology | Admitting: Radiation Oncology

## 2023-11-21 VITALS — BP 112/71 | HR 89 | Temp 98.3°F | Resp 18 | Ht 70.0 in | Wt 157.3 lb

## 2023-11-21 DIAGNOSIS — C679 Malignant neoplasm of bladder, unspecified: Secondary | ICD-10-CM | POA: Diagnosis not present

## 2023-11-23 ENCOUNTER — Ambulatory Visit: Admitting: Radiation Oncology

## 2023-11-24 NOTE — Progress Notes (Signed)
 Department of Radiation Oncology    Followup Note    Name: Erik Keith Date: 11/24/2023 MRN: [id number] DOB: 07-27-1940   Diagnosis:     ICD-10-CM   1. Malignant neoplasm of urinary bladder, unspecified site (HCC)  C67.9         MEDICATIONS: Current Outpatient Medications  Medication Sig Dispense Refill   OLANZapine  (ZYPREXA ) 10 MG tablet TAKE 1 TABLET(10 MG) BY MOUTH AT BEDTIME 5 tablet 0   amLODipine  (NORVASC ) 10 MG tablet Take 1 tablet (10 mg total) by mouth daily. 90 tablet 3   aspirin  EC 81 MG tablet Take 81 mg by mouth daily.     atorvastatin  (LIPITOR) 40 MG tablet Take 40 mg by mouth at bedtime.     Cyanocobalamin (B-12) 1000 MCG TABS Take 1,000 mcg by mouth daily.     diphenhydrAMINE  (BENADRYL ) 50 MG tablet Take 1 tablet (50 mg total) by mouth once for 1 dose. 1 hour before schedule CT scan (in preparation for contrast administration) 1 tablet 0   Ferrous Sulfate  (IRON) 325 (65 Fe) MG TABS Take by mouth. 1 tablet 3 times a week     gabapentin  (NEURONTIN ) 300 MG capsule Take 300 mg by mouth 2 (two) times daily. (Patient taking differently: Take 300 mg by mouth 2 (two) times daily. Taking as needed)     glimepiride (AMARYL) 2 MG tablet Take 2 mg by mouth daily with breakfast.     glucose blood (ONETOUCH ULTRA) test strip USE TO CHECK BLOOD SUGAR ONCE A DAY E11.9     hydrochlorothiazide  (MICROZIDE ) 12.5 MG capsule TAKE 1 CAPSULE BY MOUTH EVERY DAY Oral; Duration: 90 Days     HYDROcodone -acetaminophen  (NORCO) 10-325 MG tablet Take 1 tablet by mouth 3 (three) times daily as needed.     isosorbide  mononitrate (IMDUR ) 30 MG 24 hr tablet TAKE 1 TABLET(30 MG) BY MOUTH DAILY 90 tablet 3   lidocaine -prilocaine  (EMLA ) cream Apply to affected area once 30 g 3   lisinopril  (ZESTRIL ) 20 MG tablet Oral; Duration: 90 Days     magnesium  oxide (MAG-OX) 400 (240 Mg) MG tablet Take 1 tablet by mouth daily.     methocarbamol  (ROBAXIN ) 500 MG tablet Take 500 mg by mouth every 8 (eight)  hours as needed.     nitroGLYCERIN  (NITROSTAT ) 0.4 MG SL tablet Place 1 tablet (0.4 mg total) under the tongue every 5 (five) minutes as needed for chest pain. 25 tablet 3   nystatin cream (MYCOSTATIN) Apply 1 Application topically as needed.     ondansetron  (ZOFRAN ) 8 MG tablet Take 1 tablet (8 mg total) by mouth every 8 (eight) hours as needed for nausea or vomiting. 30 tablet 1   OPTIFIBER LEAN PO Take by mouth.     Oxycodone  HCl 10 MG TABS One tab po q4-6hr prn pain 60 tablet 0   pantoprazole  (PROTONIX ) 40 MG tablet Take 40 mg by mouth daily.     polyethylene glycol powder (GLYCOLAX /MIRALAX ) 17 GM/SCOOP powder Take by mouth once.     predniSONE  (DELTASONE ) 20 MG tablet Take 1 tablet (20 mg total) by mouth daily with breakfast. 21 tablet 0   promethazine  (PHENERGAN ) 25 MG tablet Take 1 tablet (25 mg total) by mouth every 6 (six) hours as needed for nausea. 30 tablet 1   tamsulosin (FLOMAX) 0.4 MG CAPS capsule Take 0.4 mg by mouth daily.     URIBEL 81.6 MG TABS Take 1 tablet by mouth every 8 (eight) hours as needed.  zolpidem  (AMBIEN ) 10 MG tablet Take 10 mg by mouth at bedtime as needed.     No current facility-administered medications for this encounter.   Facility-Administered Medications Ordered in Other Encounters  Medication Dose Route Frequency Provider Last Rate Last Admin   regadenoson  (LEXISCAN ) injection SOLN 0.4 mg  0.4 mg Intravenous Once Nishan, Yaziel C, MD       technetium tetrofosmin  (TC-MYOVIEW ) injection 31 millicurie  31 millicurie Intravenous Once PRN Nishan, Ari C, MD         ALLERGIES: Iodinated contrast media, Other, Sucralfate, and Penicillins   LABORATORY DATA:  Lab Results  Component Value Date   WBC 7.6 11/14/2023   HGB 13.3 11/14/2023   HCT 40.1 11/14/2023   MCV 93.0 11/14/2023   PLT 122 (L) 11/14/2023   Lab Results  Component Value Date   NA 139 11/14/2023   K 4.4 11/14/2023   CL 101 11/14/2023   CO2 26 11/14/2023   Lab Results  Component  Value Date   ALT 7 11/14/2023   AST 18 11/14/2023   ALKPHOS 83 11/14/2023   BILITOT 0.5 11/14/2023     NARRATIVE: Erik Keith was seen today in followup.  He completed external beam radiation on 10/18/2023.  He was recently seen in follow-up by Dr. Ezzard.  A nephrostomy tube remains in place. He reports that his energy level remains somewhat depressed, as does his appetite.  He has not noticed any blood in his urine.  He reports no fevers or chills.  Recent CT imaging of his pelvis revealed slightly improved hydroureter; note was made that soft tissue fullness in his right posterior bladder appeared less conspicuous.   PHYSICAL EXAMINATION: height is 5' 10 (1.778 m) and weight is 157 lb 4.8 oz (71.4 kg). His oral temperature is 98.3 F (36.8 C). His blood pressure is 112/71 and his pulse is 89. His respiration is 18 and oxygen saturation is 98%.      His abdomen is nontender and nondistended.  ASSESSMENT: The patient is doing reasonably well approximately 5 weeks out from completion of radiation for his bladder carcinoma.  He has follow-up scheduled with Dr. Marda for next week. He sees Dr. Ezzard again in [place] of next year.    PLAN: The patient will follow up on an as needed basis, as he continues to be followed by Drs. Ezzard armin Marda.  However, I encouraged him to contact me at any time with any questions or concerns he [place] have.    Primitivo Merkey A. Jomarie, MD

## 2023-11-26 ENCOUNTER — Other Ambulatory Visit: Payer: Self-pay | Admitting: Urology

## 2023-11-26 DIAGNOSIS — N1330 Unspecified hydronephrosis: Secondary | ICD-10-CM

## 2023-11-26 DIAGNOSIS — N401 Enlarged prostate with lower urinary tract symptoms: Secondary | ICD-10-CM | POA: Diagnosis not present

## 2023-11-26 DIAGNOSIS — C672 Malignant neoplasm of lateral wall of bladder: Secondary | ICD-10-CM | POA: Diagnosis not present

## 2023-11-27 ENCOUNTER — Other Ambulatory Visit

## 2023-12-03 ENCOUNTER — Inpatient Hospital Stay
Admission: RE | Admit: 2023-12-03 | Discharge: 2023-12-03 | Disposition: A | Source: Ambulatory Visit | Attending: Urology | Admitting: Urology

## 2023-12-03 ENCOUNTER — Ambulatory Visit
Admission: RE | Admit: 2023-12-03 | Discharge: 2023-12-03 | Disposition: A | Source: Ambulatory Visit | Attending: Interventional Radiology | Admitting: Interventional Radiology

## 2023-12-03 VITALS — BP 112/73 | HR 92 | Temp 98.2°F | Resp 16 | Wt 157.0 lb

## 2023-12-03 DIAGNOSIS — N1330 Unspecified hydronephrosis: Secondary | ICD-10-CM

## 2023-12-03 DIAGNOSIS — Z466 Encounter for fitting and adjustment of urinary device: Secondary | ICD-10-CM | POA: Diagnosis not present

## 2023-12-03 DIAGNOSIS — N135 Crossing vessel and stricture of ureter without hydronephrosis: Secondary | ICD-10-CM

## 2023-12-03 MED ORDER — LIDOCAINE HCL 1 % IJ SOLN
10.0000 mL | Freq: Once | INTRAMUSCULAR | Status: AC
  Administered 2023-12-03: 10 mL via INTRADERMAL

## 2023-12-18 ENCOUNTER — Inpatient Hospital Stay: Attending: Radiation Oncology | Admitting: Dietician

## 2023-12-18 NOTE — Progress Notes (Signed)
 Nutrition Follow Up:  Called patient at home telephone, wife answered said patient not eating much and sleeping often.  He has completed his cancer treatments and is not due for scheduled follow up for 6 months.  Patient states little change with PO he denies any pain with eating, nausea or NIS. Bowels are regular. Has been drinking 1 ONS  at night, he buys it at [employer] but doesn't remember name.   Usual intake 3 meals most days.   Little change in selections of foods ( bacon, eggs grits Potato soup, country style steak creamed potatoes.)     Medications: no changes   Labs: no new since 11/14/23      Anthropometrics:  no sig weight changes in EMR past month, patient reports 155# at home last week.   Height: 70 Weight: 12/08/23  157# 11/21/23  157.3# 11/15/23  158.3# 10/15/23  163# at radiation PUT 10/08/23  162.4# at radiation PUT 09/24/23  178# at radiation PUT 09/16/23  177.4# 09/03/23  174.1# UBW: 195-200# BMI: 22.53   Estimated Energy Needs   Kcals: 2600-3000 Protein: 68-85 g Fluid: 3 L     NUTRITION DIAGNOSIS: Inadequate PO intake to meet increased nutrient needs with cancer.  Continues  INTERVENTION:    Encouraged weekly weights at home.  Encouraged increasing use of (350-500 cal, less than 20 g Pro) ONS  to BID.  Encouraged adequate hydration.  Relayed alternatives to plain water and encouraged high calorie options as able between meals for renal preservation.    MONITORING, EVALUATION, GOAL: weight trends, nutrition impact symptoms, PO intake, labs  Goal is weight maintenance.   Next Visit: PRN at patient or provider request if weight loss continues  Micheline Craven, RDN, LDN Registered Dietitian, [facility] Part Time Remote (Usual office hours: Tuesday-Thursday) Mobile: [phone]

## 2023-12-27 ENCOUNTER — Other Ambulatory Visit: Payer: Self-pay | Admitting: Oncology

## 2024-01-01 ENCOUNTER — Other Ambulatory Visit: Payer: Self-pay | Admitting: Hematology and Oncology

## 2024-01-01 ENCOUNTER — Encounter: Payer: Self-pay | Admitting: Oncology

## 2024-01-21 ENCOUNTER — Other Ambulatory Visit (HOSPITAL_BASED_OUTPATIENT_CLINIC_OR_DEPARTMENT_OTHER): Payer: Self-pay | Admitting: Urology

## 2024-01-21 ENCOUNTER — Other Ambulatory Visit: Payer: Self-pay | Admitting: Urology

## 2024-01-21 ENCOUNTER — Ambulatory Visit (HOSPITAL_BASED_OUTPATIENT_CLINIC_OR_DEPARTMENT_OTHER)
Admission: RE | Admit: 2024-01-21 | Discharge: 2024-01-21 | Disposition: A | Source: Ambulatory Visit | Attending: Urology | Admitting: Urology

## 2024-01-21 DIAGNOSIS — N1330 Unspecified hydronephrosis: Secondary | ICD-10-CM

## 2024-01-29 NOTE — Progress Notes (Signed)
 "  Referring Physician(s): McInnis,Caitlyn M  Supervising Physician: Karalee Beat  Chief Complaint: The patient is seen in follow up today s/p right percutaneous nephrostomy tube placement   History of present illness:  Erik Keith, 84 year old male, has a medical history significant for HTN, DM, CKD, CAD MI s/p CABG and MGUS. He also has a history of bladder cancer with right sided obstruction and right hydronephrosis. He was initially seen in IR 08/20/23 for right nephrostomy tube placement. He was last evaluated 12/02/24 and underwent drain exchange with contrast injection. This showed a persistently obstructed UVJ and Dr. Jenna discussed the possible placement of a ureteral stent.   The patient presents today for a repeat drain evaluation with PCN exchange and contrast injection under fluoroscopy.    Past Medical History:  Diagnosis Date   Aortic valve stenosis, severe    Bladder cancer (HCC)    CAD S/P percutaneous coronary angioplasty    12-2006  STENT TO LAD;  07-2007 DEStenting TO RCA    CKD (chronic kidney disease)    GERD (gastroesophageal reflux disease)    Hypertension    Incidental pulmonary nodule, less than or equal to 3mm 10/02/2017   Ground glass opacities RUL seen on CTA   Iron deficiency anemia    MGUS (monoclonal gammopathy of unknown significance)    Mixed hyperlipidemia    Myocardial infarction (HCC) 10/02/2017   OA (osteoarthritis) of shoulder    LEFT SHOULDER AND AC JOINT   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   23 mm Edwards Inspiris Resilia stented bioprosthetic tissue valve   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   LIMA to LAD, Sequential SVG to OM1-OM2, SVG to PDA, EVH via right thigh   Type 2 diabetes mellitus (HCC)     Past Surgical History:  Procedure Laterality Date   AORTIC VALVE REPLACEMENT N/A 10/03/2017   Procedure: AORTIC VALVE REPLACEMENT (AVR);  Surgeon: Dusty Sudie DEL, MD;  Location: [facility] OR;  Service: Open Heart  Surgery;  Laterality: N/A;   APPENDECTOMY     BACK SURGERY  x3   CORONARY ARTERY BYPASS GRAFT N/A 10/03/2017   Procedure: CORONARY ARTERY BYPASS GRAFTING (CABG)x three, USING LEFT INTERNAL MAMMARY ARTERY AND RIGHT GREATER SAPHENOUS VEIN HARVESTED ENDOSCOPICALLY;  Surgeon: Dusty Sudie DEL, MD;  Location: [facility] OR;  Service: Open Heart Surgery;  Laterality: N/A;   CORONARY/GRAFT ACUTE MI REVASCULARIZATION N/A 10/02/2017   Procedure: Coronary/Graft Acute MI Revascularization;  Surgeon: Dann Candyce RAMAN, MD;  Location: [facility] INVASIVE CV LAB;  Service: Cardiovascular;  Laterality: N/A;   IR CATHETER TUBE CHANGE  12/03/2023   IR NEPHROSTOMY PLACEMENT RIGHT  08/20/2023   LEFT HEART CATH AND CORONARY ANGIOGRAPHY N/A 10/02/2017   Procedure: LEFT HEART CATH AND CORONARY ANGIOGRAPHY;  Surgeon: Dann Candyce RAMAN, MD;  Location: [facility] INVASIVE CV LAB;  Service: Cardiovascular;  Laterality: N/A;   LUMBAR LAMINECTOMY/DECOMPRESSION MICRODISCECTOMY N/A 12/04/2021   Procedure: L2-3 DECOMPRESSION;  Surgeon: Burnetta Aures, MD;  Location: [facility] OR;  Service: Orthopedics;  Laterality: N/A;  2.5 hrs 3 C-Bed   RIGHT/LEFT HEART CATH AND CORONARY ANGIOGRAPHY N/A 09/12/2017   Procedure: RIGHT/LEFT HEART CATH AND CORONARY ANGIOGRAPHY;  Surgeon: Verlin Lonni BIRCH, MD;  Location: [facility] INVASIVE CV LAB;  Service: Cardiovascular;  Laterality: N/A;   TONSILLECTOMY      Allergies: Iodinated contrast media, Other, Sucralfate, and Penicillins  Medications: Prior to Admission medications  Medication Sig Start Date End Date Taking? Authorizing Provider  OLANZapine  (ZYPREXA ) 10 MG tablet TAKE 1  TABLET(10 MG) BY MOUTH AT BEDTIME 09/20/23   Lewis, Dequincy A, MD  amLODipine  (NORVASC ) 10 MG tablet Take 1 tablet (10 mg total) by mouth daily. 09/18/23   Verlin Lonni BIRCH, MD  aspirin  EC 81 MG tablet Take 81 mg by mouth daily. 09/14/19   [provider]  atorvastatin  (LIPITOR) 40 MG tablet Take 40 mg by mouth at bedtime.  04/15/21   [provider]  Cyanocobalamin (B-12) 1000 MCG TABS Take 1,000 mcg by mouth daily.    [provider]  diphenhydrAMINE  (BENADRYL ) 50 MG tablet Take 1 tablet (50 mg total) by mouth once for 1 dose. 1 hour before schedule CT scan (in preparation for contrast administration) 09/27/23 09/27/23  Parsons, Melissa A, NP  Ferrous Sulfate  (IRON) 325 (65 Fe) MG TABS Take by mouth. 1 tablet 3 times a week    [provider]  gabapentin  (NEURONTIN ) 300 MG capsule Take 300 mg by mouth 2 (two) times daily. Patient taking differently: Take 300 mg by mouth 2 (two) times daily. Taking as needed 12/07/21   [provider]  glimepiride (AMARYL) 2 MG tablet Take 2 mg by mouth daily with breakfast.    [provider]  glucose blood (ONETOUCH ULTRA) test strip USE TO CHECK BLOOD SUGAR ONCE A DAY E11.9 02/23/19   [provider]  hydrochlorothiazide  (MICROZIDE ) 12.5 MG capsule TAKE 1 CAPSULE BY MOUTH EVERY DAY Oral; Duration: 90 Days    [provider]  HYDROcodone -acetaminophen  (NORCO) 10-325 MG tablet Take 1 tablet by mouth 3 (three) times daily as needed.    [provider]  isosorbide  mononitrate (IMDUR ) 30 MG 24 hr tablet TAKE 1 TABLET(30 MG) BY MOUTH DAILY 06/19/23   Verlin Lonni BIRCH, MD  lidocaine -prilocaine  (EMLA ) cream Apply to affected area once 08/13/23   Lewis, Dequincy A, MD  lisinopril  (ZESTRIL ) 20 MG tablet Oral; Duration: 90 Days    [provider]  magnesium  oxide (MAG-OX) 400 (240 Mg) MG tablet Take 1 tablet by mouth daily. 10/30/23   [provider]  methocarbamol  (ROBAXIN ) 500 MG tablet Take 500 mg by mouth every 8 (eight) hours as needed. 01/09/22   [provider]  nitroGLYCERIN  (NITROSTAT ) 0.4 MG SL tablet Place 1 tablet (0.4 mg total) under the tongue every 5 (five) minutes as needed for chest pain. 10/22/17   Parthenia Olivia HERO, PA-C  nystatin cream (MYCOSTATIN) Apply 1 Application topically  as needed. 06/27/23   [provider]  ondansetron  (ZOFRAN ) 8 MG tablet Take 1 tablet (8 mg total) by mouth every 8 (eight) hours as needed for nausea or vomiting. 08/13/23   Ezzard Valaria LABOR, MD  OPTIFIBER LEAN PO Take by mouth.    [provider]  Oxycodone  HCl 10 MG TABS One tab po q4-6hr prn pain 09/20/23   Lewis, Dequincy A, MD  pantoprazole  (PROTONIX ) 40 MG tablet Take 40 mg by mouth daily. 12/09/19   [provider]  polyethylene glycol powder (GLYCOLAX /MIRALAX ) 17 GM/SCOOP powder Take by mouth once.    [provider]  predniSONE  (DELTASONE ) 20 MG tablet Take 1 tablet (20 mg total) by mouth daily with breakfast. 11/15/23   Lewis, Dequincy A, MD  promethazine  (PHENERGAN ) 25 MG tablet TAKE 1 TABLET(25 MG) BY MOUTH EVERY 6 HOURS AS NEEDED FOR NAUSEA 01/01/24   Parsons, Melissa A, NP  tamsulosin (FLOMAX) 0.4 MG CAPS capsule Take 0.4 mg by mouth daily. 06/25/23   [provider]  URIBEL 81.6 MG TABS Take 1 tablet by  mouth every 8 (eight) hours as needed. 08/12/23   [provider]  zolpidem  (AMBIEN ) 10 MG tablet Take 10 mg by mouth at bedtime as needed. 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  Tobacco Use   Smoking status: Former    Current packs/day: 0.00    Types: Cigarettes    Start date: 1962    Quit date: 2002    Years since quitting: 24.0   Smokeless tobacco: Never  Vaping Use   Vaping status: Never Used  Substance and Sexual Activity   Alcohol use: Never   Drug use: Never   Sexual activity: Not Currently  Other Topics Concern   Not  on file  Social History Narrative   Not on file   Social Drivers of Health   Tobacco Use: Medium Risk (01/16/2024)   Received from [facility]   Patient History    Smoking Tobacco Use: Former    Smokeless Tobacco Use: Never    Passive Exposure: Not on file  Financial Resource Strain: Low Risk (05/31/2023)   Received from [facility]   Overall Financial Resource Strain (CARDIA)    Difficulty of Paying Living Expenses: Not hard at all  Food Insecurity: Low Risk (10/21/2023)   Received from [facility]   Epic    Within the past 12 months, you worried that your food would run out before you got money to buy more: Never true    Within the past 12 months, the food you bought just didn't last and you didn't have money to get more. : Never true  Transportation Needs: No Transportation Needs (10/21/2023)   Received from [facility]    In the past 12 months, has lack of reliable transportation kept you from medical appointments, meetings, work or from getting things needed for daily living? : No  Recent Concern: Transportation Needs - Unmet Transportation Needs (07/26/2023)   Received from [facility]    In the past 12 months, has lack of reliable transportation kept you from medical appointments, meetings, work or from getting things needed for daily living? : Yes  Physical Activity: Not on file  Stress: Not on file  Social Connections: Not on file  Depression (PHQ2-9): Low Risk (11/21/2023)   Depression (PHQ2-9)    PHQ-2 Score: 0  Alcohol Screen: Not on file  Housing: Low Risk (10/21/2023)   Received from [facility]   Epic    What is your living situation today?: I have a steady place to live    Think about the place you live. Do you have problems with any of the following? Choose all that apply:: None/None on this list  Utilities: Low Risk (10/21/2023)   Received from [facility]   Utilities    In the past 12 months has the electric,  gas, oil, or water company threatened to shut off services in your home? : No  Health Literacy: Not on file  Vital Signs: There were no vitals taken for this visit.  Physical Exam  Imaging: No results found.  Labs:  CBC: Recent Labs    08/20/23 1326 08/26/23 0936 09/16/23 1003 11/14/23 1259  WBC 8.6 14.0* 6.2 7.6  HGB 12.2* 12.6* 11.4* 13.3  HCT 37.6* 38.7* 34.4* 40.1  PLT 179 189 153 122*    COAGS: Recent Labs    08/20/23 1326  INR 1.0    BMP: Recent Labs    08/20/23 1326 08/26/23 0936 09/16/23 1003 11/14/23 1259  NA 140 138 143 139  K 4.4 4.1 3.9 4.4  CL 105 100 106 101  CO2 23 24 25 26   GLUCOSE 193* 155* 163* 183*  BUN 29* 33* 21 31*  CALCIUM  9.5 9.6 8.9 10.0  CREATININE 2.57* 2.63* 2.39* 2.46*  GFRNONAA 24* 23* 26* 25*    LIVER FUNCTION TESTS: Recent Labs    08/07/23 1033 08/26/23 0936 09/16/23 1003 11/14/23 1259  BILITOT 0.4 0.5 0.3 0.5  AST 16 19 18 18   ALT 8 11 7 7   ALKPHOS 124 112 87 83  PROT 7.7 7.6 7.0 7.5  ALBUMIN  4.2 4.3 3.8 4.2    Assessment and Plan:  ***  Electronically Signed: Warren JONELLE Dais 01/29/2024, 11:40 AM   I spent a total of {Established Out-Pt:[id number]} in face to face in clinical consultation, greater than 50% of which was counseling/coordinating care for ***     "

## 2024-01-30 ENCOUNTER — Ambulatory Visit
Admission: RE | Admit: 2024-01-30 | Discharge: 2024-01-30 | Disposition: A | Discharge status: Home / Self Care | Source: Ambulatory Visit | Attending: Radiology | Admitting: Radiology

## 2024-01-30 ENCOUNTER — Ambulatory Visit
Admission: RE | Admit: 2024-01-30 | Discharge: 2024-01-30 | Disposition: A | Discharge status: Home / Self Care | Source: Ambulatory Visit | Attending: Urology | Admitting: Urology

## 2024-01-30 DIAGNOSIS — N1330 Unspecified hydronephrosis: Secondary | ICD-10-CM

## 2024-02-03 ENCOUNTER — Other Ambulatory Visit: Payer: Self-pay | Admitting: Oncology

## 2024-02-03 DIAGNOSIS — R112 Nausea with vomiting, unspecified: Secondary | ICD-10-CM

## 2024-02-04 ENCOUNTER — Telehealth: Payer: Self-pay

## 2024-02-04 ENCOUNTER — Other Ambulatory Visit: Payer: Self-pay | Admitting: Interventional Radiology

## 2024-02-04 DIAGNOSIS — N1330 Unspecified hydronephrosis: Secondary | ICD-10-CM

## 2024-02-04 NOTE — Telephone Encounter (Signed)
 Patient for IR attempted double J versus Nephroureteral stent placement on Wed 02/05/24, I called and spoke with the patient on the phone and gave pre-procedure instructions. Pt was made aware to be here at 10:30a, NPO after MN prior to procedure as well as driver post procedure/recovery/discharge. Pt stated understanding. Called 02/04/24

## 2024-02-05 ENCOUNTER — Encounter: Payer: Self-pay | Admitting: Radiology

## 2024-02-05 ENCOUNTER — Ambulatory Visit
Admission: RE | Admit: 2024-02-05 | Discharge: 2024-02-05 | Attending: Interventional Radiology | Admitting: Interventional Radiology

## 2024-02-05 ENCOUNTER — Other Ambulatory Visit: Payer: Self-pay

## 2024-02-05 ENCOUNTER — Other Ambulatory Visit: Payer: Self-pay | Admitting: Interventional Radiology

## 2024-02-05 DIAGNOSIS — Z8249 Family history of ischemic heart disease and other diseases of the circulatory system: Secondary | ICD-10-CM | POA: Insufficient documentation

## 2024-02-05 DIAGNOSIS — Z7982 Long term (current) use of aspirin: Secondary | ICD-10-CM | POA: Insufficient documentation

## 2024-02-05 DIAGNOSIS — I2510 Atherosclerotic heart disease of native coronary artery without angina pectoris: Secondary | ICD-10-CM | POA: Insufficient documentation

## 2024-02-05 DIAGNOSIS — N1330 Unspecified hydronephrosis: Secondary | ICD-10-CM

## 2024-02-05 DIAGNOSIS — Z8551 Personal history of malignant neoplasm of bladder: Secondary | ICD-10-CM | POA: Insufficient documentation

## 2024-02-05 DIAGNOSIS — E1122 Type 2 diabetes mellitus with diabetic chronic kidney disease: Secondary | ICD-10-CM | POA: Insufficient documentation

## 2024-02-05 DIAGNOSIS — Z87891 Personal history of nicotine dependence: Secondary | ICD-10-CM | POA: Insufficient documentation

## 2024-02-05 DIAGNOSIS — Z951 Presence of aortocoronary bypass graft: Secondary | ICD-10-CM | POA: Insufficient documentation

## 2024-02-05 DIAGNOSIS — I129 Hypertensive chronic kidney disease with stage 1 through stage 4 chronic kidney disease, or unspecified chronic kidney disease: Secondary | ICD-10-CM | POA: Insufficient documentation

## 2024-02-05 DIAGNOSIS — N189 Chronic kidney disease, unspecified: Secondary | ICD-10-CM | POA: Insufficient documentation

## 2024-02-05 DIAGNOSIS — Z7984 Long term (current) use of oral hypoglycemic drugs: Secondary | ICD-10-CM | POA: Insufficient documentation

## 2024-02-05 DIAGNOSIS — Z79899 Other long term (current) drug therapy: Secondary | ICD-10-CM | POA: Insufficient documentation

## 2024-02-05 DIAGNOSIS — Z436 Encounter for attention to other artificial openings of urinary tract: Secondary | ICD-10-CM | POA: Insufficient documentation

## 2024-02-05 DIAGNOSIS — Z923 Personal history of irradiation: Secondary | ICD-10-CM | POA: Insufficient documentation

## 2024-02-05 DIAGNOSIS — I252 Old myocardial infarction: Secondary | ICD-10-CM | POA: Insufficient documentation

## 2024-02-05 DIAGNOSIS — Z9221 Personal history of antineoplastic chemotherapy: Secondary | ICD-10-CM | POA: Insufficient documentation

## 2024-02-05 LAB — CBC WITH DIFFERENTIAL/PLATELET
Abs Immature Granulocytes: 0.03 10*3/uL (ref 0.00–0.07)
Basophils Absolute: 0 10*3/uL (ref 0.0–0.1)
Basophils Relative: 0 %
Eosinophils Absolute: 0.2 10*3/uL (ref 0.0–0.5)
Eosinophils Relative: 2 %
HCT: 42.1 % (ref 39.0–52.0)
Hemoglobin: 13.5 g/dL (ref 13.0–17.0)
Immature Granulocytes: 0 %
Lymphocytes Relative: 14 %
Lymphs Abs: 1.3 10*3/uL (ref 0.7–4.0)
MCH: 30.1 pg (ref 26.0–34.0)
MCHC: 32.1 g/dL (ref 30.0–36.0)
MCV: 93.8 fL (ref 80.0–100.0)
Monocytes Absolute: 0.7 10*3/uL (ref 0.1–1.0)
Monocytes Relative: 8 %
Neutro Abs: 7 10*3/uL (ref 1.7–7.7)
Neutrophils Relative %: 76 %
Platelets: 164 10*3/uL (ref 150–400)
RBC: 4.49 MIL/uL (ref 4.22–5.81)
RDW: 13.7 % (ref 11.5–15.5)
WBC: 9.2 10*3/uL (ref 4.0–10.5)
nRBC: 0 % (ref 0.0–0.2)

## 2024-02-05 LAB — BASIC METABOLIC PANEL WITH GFR
Anion gap: 13 (ref 5–15)
BUN: 32 mg/dL — ABNORMAL HIGH (ref 8–23)
CO2: 25 mmol/L (ref 22–32)
Calcium: 9.7 mg/dL (ref 8.9–10.3)
Chloride: 103 mmol/L (ref 98–111)
Creatinine, Ser: 2.31 mg/dL — ABNORMAL HIGH (ref 0.61–1.24)
GFR, Estimated: 27 mL/min — ABNORMAL LOW
Glucose, Bld: 116 mg/dL — ABNORMAL HIGH (ref 70–99)
Potassium: 4.2 mmol/L (ref 3.5–5.1)
Sodium: 141 mmol/L (ref 135–145)

## 2024-02-05 LAB — PROTIME-INR
INR: 1 (ref 0.8–1.2)
Prothrombin Time: 13.8 s (ref 11.4–15.2)

## 2024-02-05 LAB — GLUCOSE, CAPILLARY
Glucose-Capillary: 112 mg/dL — ABNORMAL HIGH (ref 70–99)
Glucose-Capillary: 88 mg/dL (ref 70–99)

## 2024-02-05 MED ORDER — DIPHENHYDRAMINE HCL 50 MG/ML IJ SOLN
INTRAMUSCULAR | Status: AC
  Filled 2024-02-05: qty 1

## 2024-02-05 MED ORDER — SODIUM CHLORIDE 0.9 % IV SOLN: INTRAVENOUS | Status: DC

## 2024-02-05 MED ORDER — DIPHENHYDRAMINE HCL 50 MG/ML IJ SOLN
25.0000 mg | INTRAMUSCULAR | Status: AC
  Administered 2024-02-05: 25 mg via INTRAVENOUS

## 2024-02-05 MED ORDER — FENTANYL CITRATE (PF) 100 MCG/2ML IJ SOLN
INTRAMUSCULAR | Status: AC | PRN
  Administered 2024-02-05 (×2): 50 ug via INTRAVENOUS

## 2024-02-05 MED ORDER — MIDAZOLAM HCL 2 MG/2ML IJ SOLN
INTRAMUSCULAR | Status: AC
  Filled 2024-02-05: qty 2

## 2024-02-05 MED ORDER — MIDAZOLAM HCL (PF) 2 MG/2ML IJ SOLN
INTRAMUSCULAR | Status: AC | PRN
  Administered 2024-02-05 (×2): 1 mg via INTRAVENOUS

## 2024-02-05 MED ORDER — LIDOCAINE HCL 1 % IJ SOLN: 2.0000 mL | Freq: Once | INTRAMUSCULAR | Status: DC

## 2024-02-05 MED ORDER — LIDOCAINE HCL 1 % IJ SOLN
INTRAMUSCULAR | Status: AC
  Filled 2024-02-05: qty 20

## 2024-02-05 MED ORDER — FENTANYL CITRATE (PF) 100 MCG/2ML IJ SOLN
INTRAMUSCULAR | Status: AC
  Filled 2024-02-05: qty 2

## 2024-02-05 NOTE — Progress Notes (Signed)
 Dr. Karalee in at bedside, speaking with pt. And his wife re: IR procedure. Both verbalized understanding of conversation with MD.

## 2024-02-05 NOTE — H&P (Signed)
 "     Chief Complaint: Patient was seen in consultation today for double J versus nephroureteral stent placement   Referring Physician(s): Spencer Large M  Supervising Physician: Karalee Beat  Patient Status: [facility] - Out-pt  History of Present Illness: Erik Keith is a 84 y.o. male has a medical history significant for HTN, DM, CKD, CAD MI s/p CABG and MGUS. He also has a history of bladder cancer with right sided obstruction and right hydronephrosis. He was initially seen in IR 08/20/23 for right nephrostomy tube placement. He was last evaluated 12/02/24 and underwent drain exchange with contrast injection. This showed a persistently obstructed UVJ and Dr. Jenna discussed the possible placement of a ureteral stent. The patient has completed his chemoradiation.    Patient was seen on 1/29 for follow-up to discuss possible capping trial versus attempted ureteral stent placement. His drain was injected with contrast confirming a persistent obstruction at the ureterovesical junction. Due to the recent findings - patient was scheduled for a future procedure for an attempted double J versus nephroureteral stent placement at [facility] on 02/05/24.   Patient is resting comfortably with his wife at bedside. He is eager for his procedure today with the hopes that a successful stent will take place. Patient has overall been doing well. He has been NPO since midnight. Patient reported that nephrostomy bag will some days have pink or blood-tinged fluid. Patient denies headaches or dizziness, fever, chills, chest pain, shortness of breath, wheezing, abdominal pain, nausea or vomiting.   Past Medical History:  Diagnosis Date   Aortic valve stenosis, severe    Bladder cancer (HCC)    CAD S/P percutaneous coronary angioplasty    12-2006  STENT TO LAD;  07-2007 DEStenting TO RCA    CKD (chronic kidney disease)    GERD (gastroesophageal reflux disease)    Hypertension    Incidental pulmonary nodule, less  than or equal to 3mm 10/02/2017   Ground glass opacities RUL seen on CTA   Iron deficiency anemia    MGUS (monoclonal gammopathy of unknown significance)    Mixed hyperlipidemia    Myocardial infarction (HCC) 10/02/2017   OA (osteoarthritis) of shoulder    LEFT SHOULDER AND AC JOINT   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   23 mm Edwards Inspiris Resilia stented bioprosthetic tissue valve   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   LIMA to LAD, Sequential SVG to OM1-OM2, SVG to PDA, EVH via right thigh   Type 2 diabetes mellitus (HCC)     Past Surgical History:  Procedure Laterality Date   AORTIC VALVE REPLACEMENT N/A 10/03/2017   Procedure: AORTIC VALVE REPLACEMENT (AVR);  Surgeon: Dusty Sudie DEL, MD;  Location: [facility] OR;  Service: Open Heart Surgery;  Laterality: N/A;   APPENDECTOMY     BACK SURGERY  x3   CORONARY ARTERY BYPASS GRAFT N/A 10/03/2017   Procedure: CORONARY ARTERY BYPASS GRAFTING (CABG)x three, USING LEFT INTERNAL MAMMARY ARTERY AND RIGHT GREATER SAPHENOUS VEIN HARVESTED ENDOSCOPICALLY;  Surgeon: Dusty Sudie DEL, MD;  Location: [facility] OR;  Service: Open Heart Surgery;  Laterality: N/A;   CORONARY/GRAFT ACUTE MI REVASCULARIZATION N/A 10/02/2017   Procedure: Coronary/Graft Acute MI Revascularization;  Surgeon: Dann Candyce RAMAN, MD;  Location: [facility] INVASIVE CV LAB;  Service: Cardiovascular;  Laterality: N/A;   IR CATHETER TUBE CHANGE  12/03/2023   IR NEPHROSTOMY PLACEMENT RIGHT  08/20/2023   IR RADIOLOGIST EVAL & MGMT  01/30/2024   LEFT HEART CATH AND CORONARY ANGIOGRAPHY N/A  10/02/2017   Procedure: LEFT HEART CATH AND CORONARY ANGIOGRAPHY;  Surgeon: Dann Candyce RAMAN, MD;  Location: [facility] INVASIVE CV LAB;  Service: Cardiovascular;  Laterality: N/A;   LUMBAR LAMINECTOMY/DECOMPRESSION MICRODISCECTOMY N/A 12/04/2021   Procedure: L2-3 DECOMPRESSION;  Surgeon: Burnetta Aures, MD;  Location: [facility] OR;  Service: Orthopedics;  Laterality: N/A;  2.5 hrs 3 C-Bed   RIGHT/LEFT  HEART CATH AND CORONARY ANGIOGRAPHY N/A 09/12/2017   Procedure: RIGHT/LEFT HEART CATH AND CORONARY ANGIOGRAPHY;  Surgeon: Verlin Lonni BIRCH, MD;  Location: [facility] INVASIVE CV LAB;  Service: Cardiovascular;  Laterality: N/A;   TONSILLECTOMY      Allergies: Iodinated contrast media, Other, Sucralfate, and Penicillins  Medications: Prior to Admission medications  Medication Sig Start Date End Date Taking? Authorizing Provider  amLODipine  (NORVASC ) 10 MG tablet Take 1 tablet (10 mg total) by mouth daily. 09/18/23  Yes Verlin Lonni BIRCH, MD  aspirin  EC 81 MG tablet Take 81 mg by mouth daily. 09/14/19  Yes [provider]  atorvastatin  (LIPITOR) 40 MG tablet Take 40 mg by mouth at bedtime. 04/15/21  Yes [provider]  Cyanocobalamin (B-12) 1000 MCG TABS Take 1,000 mcg by mouth daily.   Yes [provider]  glimepiride (AMARYL) 2 MG tablet Take 2 mg by mouth daily with breakfast.   Yes [provider]  hydrochlorothiazide  (MICROZIDE ) 12.5 MG capsule TAKE 1 CAPSULE BY MOUTH EVERY DAY Oral; Duration: 90 Days   Yes [provider]  HYDROcodone -acetaminophen  (NORCO) 10-325 MG tablet Take 1 tablet by mouth 3 (three) times daily as needed.   Yes [provider]  isosorbide  mononitrate (IMDUR ) 30 MG 24 hr tablet TAKE 1 TABLET(30 MG) BY MOUTH DAILY 06/19/23  Yes Verlin Lonni BIRCH, MD  lisinopril  (ZESTRIL ) 20 MG tablet Oral; Duration: 90 Days   Yes [provider]  magnesium  oxide (MAG-OX) 400 (240 Mg) MG tablet Take 1 tablet by mouth daily. 10/30/23  Yes [provider]  OLANZapine  (ZYPREXA ) 10 MG tablet TAKE 1 TABLET(10 MG) BY MOUTH AT BEDTIME 09/20/23  Yes Lewis, Dequincy A, MD  pantoprazole  (PROTONIX ) 40 MG tablet Take 40 mg by mouth daily. 12/09/19  Yes [provider]  polyethylene glycol powder (GLYCOLAX /MIRALAX ) 17 GM/SCOOP powder Take by mouth once.   Yes [provider]  promethazine  (PHENERGAN ) 25  MG tablet TAKE 1 TABLET(25 MG) BY MOUTH EVERY 6 HOURS AS NEEDED FOR NAUSEA 01/01/24  Yes Parsons, Melissa A, NP  tamsulosin (FLOMAX) 0.4 MG CAPS capsule Take 0.4 mg by mouth daily. 06/25/23  Yes [provider]  zolpidem  (AMBIEN ) 10 MG tablet Take 10 mg by mouth at bedtime as needed. 01/21/22  Yes [provider]  diphenhydrAMINE  (BENADRYL ) 50 MG tablet Take 1 tablet (50 mg total) by mouth once for 1 dose. 1 hour before schedule CT scan (in preparation for contrast administration) 09/27/23 09/27/23  Parsons, Melissa A, NP  Ferrous Sulfate  (IRON) 325 (65 Fe) MG TABS Take by mouth. 1 tablet 3 times a week    [provider]  gabapentin  (NEURONTIN ) 300 MG capsule Take 300 mg by mouth 2 (two) times daily. Patient taking differently: Take 300 mg by mouth 2 (two) times daily. Taking as needed 12/07/21   [provider]  glucose blood (ONETOUCH ULTRA) test strip USE TO CHECK BLOOD SUGAR ONCE A DAY E11.9 02/23/19   [provider]  lidocaine -prilocaine  (EMLA ) cream Apply to affected area once 08/13/23   Lewis, Dequincy A, MD  methocarbamol  (ROBAXIN ) 500 MG tablet Take 500 mg  by mouth every 8 (eight) hours as needed. 01/09/22   [provider]  nitroGLYCERIN  (NITROSTAT ) 0.4 MG SL tablet Place 1 tablet (0.4 mg total) under the tongue every 5 (five) minutes as needed for chest pain. 10/22/17   Parthenia Olivia HERO, PA-C  nystatin cream (MYCOSTATIN) Apply 1 Application topically as needed. 06/27/23   [provider]  ondansetron  (ZOFRAN ) 8 MG tablet Take 1 tablet (8 mg total) by mouth every 8 (eight) hours as needed for nausea or vomiting. 08/13/23   Ezzard Valaria LABOR, MD  OPTIFIBER LEAN PO Take by mouth.    [provider]  Oxycodone  HCl 10 MG TABS One tab po q4-6hr prn pain 09/20/23   Ezzard, Dequincy A, MD  predniSONE  (DELTASONE ) 20 MG tablet Take 1 tablet (20 mg total) by mouth daily with breakfast. 11/15/23   Lewis, Dequincy A, MD  URIBEL 81.6 MG TABS  Take 1 tablet by mouth every 8 (eight) hours as needed. 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  Tobacco Use   Smoking status: Former    Current packs/day: 0.00    Types: Cigarettes    Start date: 1962    Quit date: 2002    Years since quitting: 24.1   Smokeless tobacco: Never  Vaping Use   Vaping status: Never Used  Substance and Sexual Activity   Alcohol use: Never   Drug use: Never   Sexual activity: Not Currently  Other Topics Concern   Not on file  Social History Narrative   Not on file   Social Drivers of Health   Tobacco Use: Medium Risk (02/05/2024)   Patient History    Smoking Tobacco Use: Former    Smokeless Tobacco Use: Never    Passive Exposure: Not on Actuary Strain: Low Risk (05/31/2023)   Received from [facility]   Overall Financial Resource Strain (CARDIA)    Difficulty of Paying Living Expenses: Not hard at all  Food Insecurity: Low Risk (10/21/2023)   Received from [facility]   Epic    Within the past 12 months, you worried that your food would run out before you got money to buy more: Never true    Within the past 12 months, the food you bought just didn't last and you didn't have money to get more. : Never true  Transportation Needs: No Transportation Needs (10/21/2023)   Received from [facility]    In the past 12 months, has lack of reliable transportation kept you from medical appointments, meetings, work or from getting things needed for daily living? : No  Recent Concern: Transportation Needs - Unmet  Transportation Needs (07/26/2023)   Received from [facility]    In the past 12 months, has lack of reliable transportation kept you from medical appointments, meetings, work or from getting things needed for daily living? : Yes  Physical Activity: Not on file  Stress: Not on file  Social  Connections: Not on file  Depression (PHQ2-9): Low Risk (11/21/2023)   Depression (PHQ2-9)    PHQ-2 Score: 0  Alcohol Screen: Not on file  Housing: Low Risk (10/21/2023)   Received from [facility]   Epic    What is your living situation today?: I have a steady place to live    Think about the place you live. Do you have problems with any of the following? Choose all that apply:: None/None on this list  Utilities: Low Risk (10/21/2023)   Received from [facility]   Utilities    In the past 12 months has the electric, gas, oil, or water company threatened to shut off services in your home? : No  Health Literacy: Not on file    Review of Systems: A 12 point ROS discussed and pertinent positives are indicated in the HPI above.  All other systems are negative.    Vital Signs: BP 124/74   Pulse 86   Temp 98.1 F (36.7 C) (Oral)   Resp 17   Ht 5' 10 (1.778 m)   Wt 154 lb (69.9 kg)   SpO2 97%   BMI 22.10 kg/m  Physical Exam Constitutional:      Appearance: Normal appearance.  Cardiovascular:     Rate and Rhythm: Normal rate and regular rhythm.     Pulses: Normal pulses.     Heart sounds: Normal heart sounds.  Pulmonary:     Effort: Pulmonary effort is normal.     Breath sounds: Normal breath sounds.  Abdominal:     General: Abdomen is flat.     Palpations: Abdomen is soft.  Skin:    General: Skin is warm and dry.  Neurological:     Mental Status: He is alert and oriented to person, place, and time.  Psychiatric:        Mood and Affect: Mood normal.        Behavior: Behavior normal.     Imaging: DG Sinus/Fist Tube Chk-Non GI Result Date:  01/30/2024 INDICATION: Patient with a history of bladder cancer with right ureteral obstruction and hydronephrosis. He is s/p right percutaneous nephrostomy placement in IR 08/20/23 with exchange 12/03/23. EXAM: NEPHROSTOGRAM VIA EXISTING TUBE COMPARISON:  IR catheter tube change 12/03/23. MEDICATIONS: None. ANESTHESIA/SEDATION: None. CONTRAST:  12 ml - administered via the existing percutaneous drain. FLUOROSCOPY: Radiation Exposure Index (as provided by the fluoroscopic device): 1.2 mGy Kerma COMPLICATIONS: None immediate. PROCEDURE: The patient was positioned prone on the fluoroscopy table. A pre-procedural spot fluoroscopic image was obtained of the right flank and the existing percutaneous drainage catheter. FINDINGS: Contrast was easily injected into the right PCN with rapid flow of contrast through the kidney and down the ureter. The flow of contrast ceased at the ureterovesical junction confirming a persistent obstruction. IMPRESSION: The UVJ remains obstructed. The nephrostomy was placed back to a gravity bag and the patient will be scheduled as soon as possible for attempted double J versus nephroureteral stent placement with moderate sedation at [facility]. Imaging study performed by: Warren Dais, NP Electronically Signed   By: Wilkie Lent M.D.   On: 01/30/2024 14:24   IR Radiologist Eval & Mgmt Result Date: 01/30/2024 EXAM: See EPIC note CHIEF COMPLAINT: See EPIC note HISTORY OF PRESENT ILLNESS: See EPIC note REVIEW OF SYSTEMS: See EPIC note PHYSICAL EXAMINATION: See EPIC note ASSESSMENT AND PLAN: See EPIC note Electronically Signed   By: Wilkie Lent M.D.   On: 01/30/2024 14:03   DG Abd 1 View Result  Date: 01/21/2024 EXAM: 1 VIEW XRAY OF THE ABDOMEN 01/21/2024 02:14:29 PM COMPARISON: None available. CLINICAL HISTORY: swelling of kidneys/retaining urine swelling of kidneys/retaining urine swelling of kidneys/retaining urine FINDINGS: LINES, TUBES AND DEVICES: Right percutaneous nephrostomy  catheter in place. BOWEL: Nonobstructive bowel gas pattern. Moderate colonic stool. SOFT TISSUES: Iliofemoral calcified atherosclerosis. No radiopaque urinary stone. BONES: No acute fracture. IMPRESSION: 1. Right percutaneous nephrostomy catheter in place. No radiopaque urinary stone. 2. Constipation. Electronically signed by: Norman Gatlin MD 01/21/2024 02:19 PM EST RP Workstation: [id number]    Labs:  CBC: Recent Labs    08/26/23 0936 09/16/23 1003 11/14/23 1259 02/05/24 1029  WBC 14.0* 6.2 7.6 9.2  HGB 12.6* 11.4* 13.3 13.5  HCT 38.7* 34.4* 40.1 42.1  PLT 189 153 122* 164    COAGS: Recent Labs    08/20/23 1326 02/05/24 1029  INR 1.0 1.0    BMP: Recent Labs    08/26/23 0936 09/16/23 1003 11/14/23 1259 02/05/24 1029  NA 138 143 139 141  K 4.1 3.9 4.4 4.2  CL 100 106 101 103  CO2 24 25 26 25   GLUCOSE 155* 163* 183* 116*  BUN 33* 21 31* 32*  CALCIUM  9.6 8.9 10.0 9.7  CREATININE 2.63* 2.39* 2.46* 2.31*  GFRNONAA 23* 26* 25* 27*    LIVER FUNCTION TESTS: Recent Labs    08/07/23 1033 08/26/23 0936 09/16/23 1003 11/14/23 1259  BILITOT 0.4 0.5 0.3 0.5  AST 16 19 18 18   ALT 8 11 7 7   ALKPHOS 124 112 87 83  PROT 7.7 7.6 7.0 7.5  ALBUMIN  4.2 4.3 3.8 4.2    TUMOR MARKERS: No results for input(s): AFPTM, CEA, CA199, CHROMGRNA in the last 8760 hours.  Assessment and Plan: 84 year old male with a history of bladder cancer with right ureteral obstruction and right hydronephrosis. He underwent right percutaneous nephrostomy placement in IR 08/20/23 with exchange 12/03/23. Patient was evaluated on 1/29, he had a contrast injection which confirmed a persistent obstruction at the ureterovesical junction. Patient was seen and evaluated today for a possible attempt for a double J versus nephroureteral stent placement. Consent has been signed.   Risks and benefits discussed with the patient including bleeding, infection, damage to adjacent structures, bowel  perforation/fistula connection, and sepsis.  All of the patient's questions were answered, patient is agreeable to proceed. Consent signed and in chart.  Thank you for this interesting consult.  I greatly enjoyed meeting Jovian Lembcke Loughmiller and look forward to participating in their care.  A copy of this report was sent to the requesting provider on this date.  Electronically Signed: Thersia LULLA Rummer, PA 02/05/2024, 12:09 PM   I spent a total of  30 Minutes   in face to face in clinical consultation, greater than 50% of which was counseling/coordinating care for right ureteral obstruction with possible double J vs stent placement   "

## 2024-02-06 ENCOUNTER — Other Ambulatory Visit: Payer: Self-pay | Admitting: Interventional Radiology

## 2024-02-06 DIAGNOSIS — N1330 Unspecified hydronephrosis: Secondary | ICD-10-CM

## 2024-02-27 ENCOUNTER — Other Ambulatory Visit

## 2024-03-05 ENCOUNTER — Ambulatory Visit: Admitting: Oncology

## 2024-05-13 ENCOUNTER — Inpatient Hospital Stay

## 2024-05-13 ENCOUNTER — Other Ambulatory Visit (HOSPITAL_BASED_OUTPATIENT_CLINIC_OR_DEPARTMENT_OTHER): Admitting: Radiology

## 2024-05-14 ENCOUNTER — Inpatient Hospital Stay: Admitting: Oncology
